# Patient Record
Sex: Male | Born: 1955
Health system: Southern US, Community
[De-identification: ages and names within clinical notes are randomized; demographics above are authoritative.]

## PROBLEM LIST (undated history)

## (undated) DIAGNOSIS — M109 Gout, unspecified: Secondary | ICD-10-CM

## (undated) DIAGNOSIS — L02419 Cutaneous abscess of limb, unspecified: Secondary | ICD-10-CM

## (undated) DIAGNOSIS — R0600 Dyspnea, unspecified: Secondary | ICD-10-CM

## (undated) DIAGNOSIS — M1990 Unspecified osteoarthritis, unspecified site: Secondary | ICD-10-CM

## (undated) DIAGNOSIS — J189 Pneumonia, unspecified organism: Secondary | ICD-10-CM

## (undated) DIAGNOSIS — I509 Heart failure, unspecified: Secondary | ICD-10-CM

## (undated) DIAGNOSIS — B351 Tinea unguium: Secondary | ICD-10-CM

## (undated) DIAGNOSIS — G4733 Obstructive sleep apnea (adult) (pediatric): Secondary | ICD-10-CM

## (undated) DIAGNOSIS — I10 Essential (primary) hypertension: Secondary | ICD-10-CM

## (undated) DIAGNOSIS — M720 Palmar fascial fibromatosis [Dupuytren]: Secondary | ICD-10-CM

## (undated) DIAGNOSIS — L03119 Cellulitis of unspecified part of limb: Secondary | ICD-10-CM

## (undated) DIAGNOSIS — E119 Type 2 diabetes mellitus without complications: Secondary | ICD-10-CM

## (undated) DIAGNOSIS — E790 Hyperuricemia without signs of inflammatory arthritis and tophaceous disease: Secondary | ICD-10-CM

## (undated) DIAGNOSIS — E785 Hyperlipidemia, unspecified: Secondary | ICD-10-CM

## (undated) DIAGNOSIS — I4891 Unspecified atrial fibrillation: Secondary | ICD-10-CM

## (undated) DIAGNOSIS — K644 Residual hemorrhoidal skin tags: Secondary | ICD-10-CM

## (undated) DIAGNOSIS — F1910 Other psychoactive substance abuse, uncomplicated: Secondary | ICD-10-CM

## (undated) DIAGNOSIS — I499 Cardiac arrhythmia, unspecified: Secondary | ICD-10-CM

## (undated) HISTORY — DX: Type 2 diabetes mellitus without complications: E11.9

## (undated) HISTORY — DX: Cellulitis of unspecified part of limb: L03.119

## (undated) HISTORY — DX: Other psychoactive substance abuse, uncomplicated: F19.10

## (undated) HISTORY — DX: Cutaneous abscess of limb, unspecified: L02.419

## (undated) HISTORY — PX: LAPAROSCOPIC GASTRIC SLEEVE RESECTION: SHX5895

## (undated) HISTORY — PX: NASAL SINUS SURGERY: SHX719

## (undated) HISTORY — DX: Essential (primary) hypertension: I10

## (undated) HISTORY — DX: Tinea unguium: B35.1

## (undated) HISTORY — DX: Hyperuricemia without signs of inflammatory arthritis and tophaceous disease: E79.0

## (undated) HISTORY — DX: Hyperlipidemia, unspecified: E78.5

## (undated) HISTORY — DX: Residual hemorrhoidal skin tags: K64.4

## (undated) HISTORY — PX: WISDOM TOOTH EXTRACTION: SHX21

## (undated) HISTORY — DX: Obstructive sleep apnea (adult) (pediatric): G47.33

## (undated) HISTORY — DX: Palmar fascial fibromatosis (dupuytren): M72.0

## (undated) HISTORY — DX: Heart failure, unspecified: I50.9

## (undated) HISTORY — DX: Gout, unspecified: M10.9

## (undated) HISTORY — DX: Unspecified atrial fibrillation: I48.91

---

## 1999-11-20 ENCOUNTER — Ambulatory Visit: Admission: RE | Admit: 1999-11-20 | Discharge: 1999-11-20 | Payer: Self-pay | Admitting: Otolaryngology

## 2001-12-24 DIAGNOSIS — G4733 Obstructive sleep apnea (adult) (pediatric): Secondary | ICD-10-CM | POA: Insufficient documentation

## 2004-12-24 DIAGNOSIS — F1921 Other psychoactive substance dependence, in remission: Secondary | ICD-10-CM | POA: Insufficient documentation

## 2007-06-11 DIAGNOSIS — F17200 Nicotine dependence, unspecified, uncomplicated: Secondary | ICD-10-CM | POA: Insufficient documentation

## 2007-12-25 DIAGNOSIS — L02419 Cutaneous abscess of limb, unspecified: Secondary | ICD-10-CM

## 2007-12-25 HISTORY — DX: Cutaneous abscess of limb, unspecified: L02.419

## 2009-01-25 ENCOUNTER — Ambulatory Visit: Payer: Self-pay | Admitting: Family Medicine

## 2009-02-02 DIAGNOSIS — K644 Residual hemorrhoidal skin tags: Secondary | ICD-10-CM | POA: Insufficient documentation

## 2009-08-23 ENCOUNTER — Inpatient Hospital Stay: Payer: Self-pay | Admitting: Internal Medicine

## 2009-09-13 ENCOUNTER — Ambulatory Visit: Payer: Self-pay | Admitting: Family

## 2009-10-20 ENCOUNTER — Ambulatory Visit: Payer: Self-pay | Admitting: Family

## 2010-05-31 ENCOUNTER — Ambulatory Visit: Payer: Self-pay | Admitting: Orthopedic Surgery

## 2011-09-05 ENCOUNTER — Ambulatory Visit: Payer: Self-pay | Admitting: Family Medicine

## 2013-02-20 LAB — LIPID PANEL
Cholesterol: 242 mg/dL — AB (ref 0–200)
HDL: 38 mg/dL (ref 35–70)
LDL Cholesterol: 157 mg/dL
Triglycerides: 235 mg/dL — AB (ref 40–160)

## 2013-02-20 LAB — TSH: TSH: 2.94 u[IU]/mL (ref <=5.90)

## 2014-05-21 ENCOUNTER — Ambulatory Visit: Payer: Self-pay | Admitting: Family Medicine

## 2014-05-22 ENCOUNTER — Other Ambulatory Visit: Payer: Self-pay | Admitting: Family Medicine

## 2014-05-22 LAB — COMPREHENSIVE METABOLIC PANEL
ALK PHOS: 62 U/L
ANION GAP: 6 — AB (ref 7–16)
AST: 21 U/L (ref 15–37)
Albumin: 3.9 g/dL (ref 3.4–5.0)
BILIRUBIN TOTAL: 1 mg/dL (ref 0.2–1.0)
BUN: 16 mg/dL (ref 7–18)
Calcium, Total: 8.7 mg/dL (ref 8.5–10.1)
Chloride: 100 mmol/L (ref 98–107)
Co2: 32 mmol/L (ref 21–32)
Creatinine: 0.75 mg/dL (ref 0.60–1.30)
EGFR (African American): >60
Glucose: 108 mg/dL — ABNORMAL HIGH (ref 65–99)
OSMOLALITY: 277 (ref 275–301)
Potassium: 3.5 mmol/L (ref 3.5–5.1)
SGPT (ALT): 19 U/L (ref 12–78)
Sodium: 138 mmol/L (ref 136–145)
TOTAL PROTEIN: 7.2 g/dL (ref 6.4–8.2)

## 2014-05-22 LAB — LIPID PANEL
Cholesterol: 199 mg/dL (ref 0–200)
HDL: 38 mg/dL — AB (ref 40–60)
Ldl Cholesterol, Calc: 137 mg/dL — ABNORMAL HIGH (ref 0–100)
Triglycerides: 122 mg/dL (ref 0–200)
VLDL CHOLESTEROL, CALC: 24 mg/dL (ref 5–40)

## 2014-05-22 LAB — PROTIME-INR
INR: 1.3
Prothrombin Time: 16.4 secs — ABNORMAL HIGH (ref 11.5–14.7)

## 2014-06-08 LAB — POCT INR: INR: 2.1 — AB (ref <=1.1)

## 2014-10-06 DIAGNOSIS — K219 Gastro-esophageal reflux disease without esophagitis: Secondary | ICD-10-CM | POA: Insufficient documentation

## 2014-10-06 DIAGNOSIS — R600 Localized edema: Secondary | ICD-10-CM | POA: Insufficient documentation

## 2014-10-06 DIAGNOSIS — I4891 Unspecified atrial fibrillation: Secondary | ICD-10-CM | POA: Insufficient documentation

## 2014-10-06 DIAGNOSIS — J45909 Unspecified asthma, uncomplicated: Secondary | ICD-10-CM | POA: Insufficient documentation

## 2014-10-06 DIAGNOSIS — E785 Hyperlipidemia, unspecified: Secondary | ICD-10-CM | POA: Insufficient documentation

## 2014-10-06 DIAGNOSIS — E119 Type 2 diabetes mellitus without complications: Secondary | ICD-10-CM | POA: Insufficient documentation

## 2014-10-25 ENCOUNTER — Encounter: Payer: Self-pay | Admitting: Cardiovascular Disease

## 2014-10-25 ENCOUNTER — Ambulatory Visit (INDEPENDENT_AMBULATORY_CARE_PROVIDER_SITE_OTHER): Payer: BC Managed Care – PPO | Admitting: Cardiovascular Disease

## 2014-10-25 ENCOUNTER — Encounter (INDEPENDENT_AMBULATORY_CARE_PROVIDER_SITE_OTHER): Payer: Self-pay

## 2014-10-25 VITALS — BP 178/110 | HR 71 | Ht 72.0 in | Wt >= 6400 oz

## 2014-10-25 DIAGNOSIS — R0602 Shortness of breath: Secondary | ICD-10-CM

## 2014-10-25 DIAGNOSIS — R600 Localized edema: Secondary | ICD-10-CM

## 2014-10-25 DIAGNOSIS — I5032 Chronic diastolic (congestive) heart failure: Secondary | ICD-10-CM | POA: Insufficient documentation

## 2014-10-25 DIAGNOSIS — E119 Type 2 diabetes mellitus without complications: Secondary | ICD-10-CM

## 2014-10-25 DIAGNOSIS — I4891 Unspecified atrial fibrillation: Secondary | ICD-10-CM

## 2014-10-25 MED ORDER — DOXAZOSIN MESYLATE 8 MG PO TABS: 8.0000 mg | ORAL_TABLET | Freq: Every day | ORAL | Status: DC

## 2014-10-25 NOTE — Assessment & Plan Note (Signed)
Long discussion today concerning his shortness of breath. This is likely from diastolic CHF, obesity, deconditioning. Given his long history of smoking, unable to exclude ischemia. We'll try to obtain records from prior cardiologist to determine if any testing was done. If symptoms get worse, may need a stress test, lexiscan, though he would likely be too big for the camera

## 2014-10-25 NOTE — Patient Instructions (Addendum)
You are doing well.  Please start 1/2 pill doxazosin at night for one week Then increase up to a full pill if you have no dizziness  We will check labs today  Please call us if you have new issues that need to be addressed before your next appt.  Your physician wants you to follow-up in: 6 months.  You will receive a reminder letter in the mail two months in advance. If you don't receive a letter, please call our office to schedule the follow-up appointment.  Your next appointment will be scheduled in our new office located at :  [facility]  [address], [address]  [place], [place] [id number]

## 2014-10-25 NOTE — Assessment & Plan Note (Signed)
We will check a basic metabolic panel today. He reports dramatic improvement in his symptoms on metolazone with Lasix 80 mg daily. If renal function gets worse indicating prerenal state, we would likely recommend decreasing the dose of his metolazone. Further recognition based off his BMP. If he continues to have issues, torsemide would always be an option, 40 mg daily

## 2014-10-25 NOTE — Assessment & Plan Note (Signed)
Recommended portion control, watching his diet. He reports hemoglobin A1c is well controlled

## 2014-10-25 NOTE — Assessment & Plan Note (Signed)
Recommended weight loss. This would help his sleep, heart failure, shortness of breath

## 2014-10-25 NOTE — Assessment & Plan Note (Signed)
Lower extremity edema has significantly improved on metolazone. BMP pending

## 2014-10-25 NOTE — Progress Notes (Addendum)
Patient ID: Marco James, male    DOB: [birth year], 57 y.o.   MRN: [id number]  HPI Comments: Marco James is a 58 year old gentleman with morbid obesity, history of alcohol use, hypertension, diabetes type 2, chronic lower extremity swelling, chronic atrial fibrillation who [facility].  He reports that he developed atrial fibrillation many years ago. [facility] and per the patient was never given the option to restore normal sinus rhythm. He has been compliant with anticoagulation, denies having any problems.  Recently his lower extremity edema has been a significant issue. He drinks significant amounts of fluid including several beers at a time. Drives his motorcycle periodically, making his leg swelling worse. Does not wear compression hose.  Diuretic regimen was recently changed, now taking Lasix 80 mg in the morning with metolazone 2.5 mg daily. On this regiment he has had significant improvement in his leg swelling and breathing over the past 2 weeks. No recent lab work since June 2015. He does not take potassium on a regular basis He does report that his legs have improved dramatically, previously had sores that have now improved, even resolved. Reports having a dry mouth on a regular basis, drink significant fluids  EKG shows atrial fibrillation with ventricular rate 71 bpm, nonspecific ST abnormality Is uncertain if he has had prior workup with previous cardiologist including echocardiogram. He denies having a stress test He does have shortness of breath with exertion. He is uncertain if this is from deconditioning or other problem. He reports this is generally a chronic issue   Outpatient Encounter Prescriptions as of 10/25/2014  Medication Sig  . amLODipine (NORVASC) 10 MG tablet Take 10 mg by mouth daily.  Marland Kitchen. doxazosin (CARDURA) 8 MG tablet Take 1 tablet (8 mg total) by mouth daily.  . furosemide (LASIX) 80 MG  tablet Take 80 mg by mouth daily.  Marland Kitchen. glipiZIDE (GLUCOTROL XL) 5 MG 24 hr tablet Take 5 mg by mouth daily with breakfast.  . lisinopril (PRINIVIL,ZESTRIL) 40 MG tablet Take 40 mg by mouth daily.  . metolazone (ZAROXOLYN) 2.5 MG tablet Take 2.5 mg by mouth daily as needed.   . metoprolol (LOPRESSOR) 100 MG tablet Take 100 mg by mouth 2 (two) times daily.  . naproxen (NAPROSYN) 500 MG tablet Take 500 mg by mouth 2 (two) times daily with a meal.  . NON FORMULARY CPAP  . warfarin (COUMADIN) 6 MG tablet Take 6 mg by mouth daily.   In terms of his family history, indicated that his mother is alive. He indicated that his father is alive.    In terms of his social history,  reports that he has quit smoking. His smoking use included Cigarettes. He has a 25 pack-year smoking history. He does not have any smokeless tobacco history on file. He reports that he drinks about 4.2 oz of alcohol per week. He reports that he uses illicit drugs (Cocaine and Marijuana).  Past Medical History  Diagnosis Date  . HTN (hypertension)   . Reactive airway disease   . Hyperlipidemia   . Type 2 diabetes mellitus   . GERD (gastroesophageal reflux disease)   . Toenail fungus   . Polysubstance abuse   . A-fib   . Arrhythmia   . Hyperuricemia   . Chronic airway obstruction   . Obstructive sleep apnea   . CHF (congestive heart failure)   . Cellulitis and abscess of leg 2009  . External hemorrhoids   .  Dupuytren's disease     Review of Systems  Constitutional: Negative.   HENT: Negative.   Eyes: Negative.   Respiratory: Negative.   Cardiovascular: Negative.   Gastrointestinal: Negative.   Endocrine: Negative.   Musculoskeletal: Negative.   Skin: Negative.   Allergic/Immunologic: Negative.   Neurological: Negative.   Hematological: Negative.   Psychiatric/Behavioral: Negative.   All other systems reviewed and are negative.  BP 178/110 mmHg  Pulse 71  Ht 6' (1.829 m)  Wt 404 lb (183.253 kg)  BMI  54.78 kg/m2 Repeat blood pressure with systolic 180 He reports that he did not take his lisinopril today Physical Exam  Constitutional: He is oriented to person, place, and time. He appears well-developed and well-nourished.  HENT:  Head: Normocephalic.  Nose: Nose normal.  Mouth/Throat: Oropharynx is clear and moist.  Eyes: Conjunctivae are normal. Pupils are equal, round, and reactive to light.  Neck: Normal range of motion. Neck supple. No JVD present.  Cardiovascular: Normal rate, regular rhythm, S1 normal, S2 normal, normal heart sounds and intact distal pulses.  Exam reveals no gallop and no friction rub.   No murmur heard. Pulmonary/Chest: Effort normal and breath sounds normal. No respiratory distress. He has no wheezes. He has no rales. He exhibits no tenderness.  Abdominal: Soft. Bowel sounds are normal. He exhibits no distension. There is no tenderness.  Musculoskeletal: Normal range of motion. He exhibits no edema or tenderness.  Lymphadenopathy:    He has no cervical adenopathy.  Neurological: He is alert and oriented to person, place, and time. Coordination normal.  Skin: Skin is warm and dry. No rash noted. No erythema.  Psychiatric: He has a normal mood and affect. His behavior is normal. Judgment and thought content normal.      Assessment and Plan   Nursing note and vitals reviewed.

## 2014-10-25 NOTE — Assessment & Plan Note (Signed)
Heart rate is well controlled, tolerating anticoagulation. Not a candidate for cardioversion given that he is been in atrial fibrillation for many years and this would likely not be successful. Luckily he is relatively asymptomatic apart from his diastolic CHF

## 2014-10-26 ENCOUNTER — Other Ambulatory Visit: Payer: Self-pay | Admitting: *Deleted

## 2014-10-26 LAB — BASIC METABOLIC PANEL
BUN/Creatinine Ratio: 16 (ref 9–20)
BUN: 21 mg/dL (ref 6–24)
CO2: 32 mmol/L — ABNORMAL HIGH (ref 18–29)
Calcium: 9.2 mg/dL (ref 8.7–10.2)
Chloride: 88 mmol/L — ABNORMAL LOW (ref 97–108)
Creatinine, Ser: 1.3 mg/dL — ABNORMAL HIGH (ref 0.76–1.27)
GFR calc Af Amer: 70 mL/min/{1.73_m2} (ref >59)
GFR calc non Af Amer: 61 mL/min/{1.73_m2} (ref >59)
Glucose: 159 mg/dL — ABNORMAL HIGH (ref 65–99)
Potassium: 3.2 mmol/L — ABNORMAL LOW (ref 3.5–5.2)
Sodium: 137 mmol/L (ref 134–144)

## 2014-10-26 MED ORDER — DOXAZOSIN MESYLATE 8 MG PO TABS: 8.0000 mg | ORAL_TABLET | Freq: Every day | ORAL | Status: DC

## 2014-10-26 NOTE — Telephone Encounter (Signed)
Requested Prescriptions   Signed Prescriptions Disp Refills  . doxazosin (CARDURA) 8 MG tablet 90 tablet 3    Sig: Take 1 tablet (8 mg total) by mouth daily.    Authorizing Provider: Antonieta IbaGOLLAN, TIMOTHY J    Ordering User: Kendrick FriesLOPEZ, Cortlan Dolin C

## 2014-10-27 ENCOUNTER — Other Ambulatory Visit: Payer: Self-pay

## 2014-10-27 MED ORDER — POTASSIUM CHLORIDE CRYS ER 20 MEQ PO TBCR: 20.0000 meq | EXTENDED_RELEASE_TABLET | Freq: Every day | ORAL | Status: DC

## 2015-02-15 LAB — CBC AND DIFFERENTIAL
HCT: 43 % (ref 41–53)
HEMOGLOBIN: 15 g/dL (ref 13.5–17.5)
Neutrophils Absolute: 79 /uL
PLATELETS: 150 10*3/uL (ref 150–399)
WBC: 8 10^3/mL

## 2015-02-15 LAB — BASIC METABOLIC PANEL
BUN: 22 mg/dL — AB (ref 4–21)
Creatinine: 1.4 mg/dL — AB (ref <=1.3)
Glucose: 194 mg/dL
Potassium: 3.5 mmol/L (ref 3.4–5.3)
SODIUM: 139 mmol/L (ref 137–147)

## 2015-02-15 LAB — HEMOGLOBIN A1C: Hgb A1c MFr Bld: 6.2 % — AB (ref 4.0–6.0)

## 2015-02-15 LAB — HEPATIC FUNCTION PANEL
ALT: 19 U/L (ref 10–40)
AST: 18 U/L (ref 14–40)
Alkaline Phosphatase: 49 U/L (ref 25–125)
Bilirubin, Total: 0.8 mg/dL

## 2015-06-13 ENCOUNTER — Encounter: Payer: Self-pay | Admitting: Family Medicine

## 2015-06-13 ENCOUNTER — Ambulatory Visit (INDEPENDENT_AMBULATORY_CARE_PROVIDER_SITE_OTHER): Payer: BLUE CROSS/BLUE SHIELD | Admitting: Family Medicine

## 2015-06-13 VITALS — BP 130/76 | HR 66 | Temp 98.5°F | Resp 18 | Wt 389.0 lb

## 2015-06-13 DIAGNOSIS — I5032 Chronic diastolic (congestive) heart failure: Secondary | ICD-10-CM

## 2015-06-13 DIAGNOSIS — Z8639 Personal history of other endocrine, nutritional and metabolic disease: Secondary | ICD-10-CM | POA: Diagnosis not present

## 2015-06-13 DIAGNOSIS — E1140 Type 2 diabetes mellitus with diabetic neuropathy, unspecified: Secondary | ICD-10-CM | POA: Diagnosis not present

## 2015-06-13 DIAGNOSIS — Z87898 Personal history of other specified conditions: Secondary | ICD-10-CM | POA: Insufficient documentation

## 2015-06-13 DIAGNOSIS — E668 Other obesity: Secondary | ICD-10-CM | POA: Insufficient documentation

## 2015-06-13 DIAGNOSIS — R600 Localized edema: Secondary | ICD-10-CM | POA: Insufficient documentation

## 2015-06-13 DIAGNOSIS — I2510 Atherosclerotic heart disease of native coronary artery without angina pectoris: Secondary | ICD-10-CM | POA: Insufficient documentation

## 2015-06-13 DIAGNOSIS — E7800 Pure hypercholesterolemia, unspecified: Secondary | ICD-10-CM | POA: Insufficient documentation

## 2015-06-13 DIAGNOSIS — I4891 Unspecified atrial fibrillation: Secondary | ICD-10-CM | POA: Diagnosis not present

## 2015-06-13 DIAGNOSIS — I10 Essential (primary) hypertension: Secondary | ICD-10-CM | POA: Diagnosis not present

## 2015-06-13 DIAGNOSIS — F1910 Other psychoactive substance abuse, uncomplicated: Secondary | ICD-10-CM | POA: Insufficient documentation

## 2015-06-13 DIAGNOSIS — E790 Hyperuricemia without signs of inflammatory arthritis and tophaceous disease: Secondary | ICD-10-CM | POA: Insufficient documentation

## 2015-06-13 DIAGNOSIS — R609 Edema, unspecified: Secondary | ICD-10-CM | POA: Insufficient documentation

## 2015-06-13 DIAGNOSIS — E669 Obesity, unspecified: Secondary | ICD-10-CM | POA: Insufficient documentation

## 2015-06-13 LAB — POCT GLYCOSYLATED HEMOGLOBIN (HGB A1C): HEMOGLOBIN A1C: 5.3

## 2015-06-13 MED ORDER — POTASSIUM CHLORIDE CRYS ER 20 MEQ PO TBCR: 20.0000 meq | EXTENDED_RELEASE_TABLET | Freq: Two times a day (BID) | ORAL | Status: DC

## 2015-06-13 NOTE — Progress Notes (Signed)
Subjective:    Patient ID: Marco James [facility], male    DOB: [birth year], 59 y.o.   MRN: [id number]  HPI Follow-up DM. Denied polyuria, polydipsia or polyphagia. Peripheral tingling or pins and needles sensation less but still present at times. Has not lost significant amount of weight. Still taking the Glipizide with no significant changes in BS.  Chronic atrial fibrillation stable with use of Metoprolol daily. No chest pains, diaphoresis or palpitations.  Chronic diastolic CHF better controlled. No worsening of dyspnea on exertion (especially with his degree of obesity). Continue to use the coumadin 6 mg qd, Zaroxolyn and Lasix as needed for edema of lower legs. No increase in lower leg edema. Still getting KCL everytime he uses these medication.  Essential hypertension well controlled on Amlodipine, Doxazosin, Lisinopril and diuretics for control of BP. No significant muscle cramps and still trying to drink extra fluids during the hot weather.   CMP     Component Value Date/Time   NA 139 02/15/2015   NA 138 05/22/2014 1337   K 3.5 02/15/2015   K 3.5 05/22/2014 1337   CL 88* 10/25/2014 1528   CL 100 05/22/2014 1337   CO2 32* 10/25/2014 1528   CO2 32 05/22/2014 1337   GLUCOSE 159* 10/25/2014 1528   GLUCOSE 108* 05/22/2014 1337   BUN 22* 02/15/2015   BUN 16 05/22/2014 1337   CREATININE 1.4* 02/15/2015   CREATININE 1.30* 10/25/2014 1528   CREATININE 0.75 05/22/2014 1337   CALCIUM 9.2 10/25/2014 1528   CALCIUM 8.7 05/22/2014 1337   PROT 7.2 05/22/2014 1337   ALBUMIN 3.9 05/22/2014 1337   AST 18 02/15/2015   AST 21 05/22/2014 1337   ALT 19 02/15/2015   ALT 19 05/22/2014 1337   ALKPHOS 49 02/15/2015   ALKPHOS 62 05/22/2014 1337   GFRNONAA 61 10/25/2014 1528   GFRNONAA >60 05/22/2014 1337   GFRAA 70 10/25/2014 1528   GFRAA >60 05/22/2014 1337     Past Medical History  Diagnosis Date  . HTN (hypertension)   . Reactive airway disease   . Hyperlipidemia   . Type 2 diabetes  mellitus   . GERD (gastroesophageal reflux disease)   . Toenail fungus   . Polysubstance abuse   . A-fib   . Arrhythmia   . Hyperuricemia   . Chronic airway obstruction   . Obstructive sleep apnea   . CHF (congestive heart failure)   . Cellulitis and abscess of leg 2009  . External hemorrhoids   . Dupuytren's disease    History reviewed. No pertinent past surgical history. History  Substance Use Topics  . Smoking status: Former Smoker -- 1.00 packs/day for 25 years    Types: Cigarettes  . Smokeless tobacco: Not on file  . Alcohol Use: 4.2 oz/week    5 Cans of beer, 2 Shots of liquor per week     Comment: OCCASIONALLY   Family History  Problem Relation Age of Onset  . Heart disease Father 28   Current Outpatient Prescriptions on File Prior to Visit  Medication Sig Dispense Refill  . amLODipine (NORVASC) 10 MG tablet Take 10 mg by mouth daily.    Marland Kitchen doxazosin (CARDURA) 8 MG tablet Take 1 tablet (8 mg total) by mouth daily. 90 tablet 3  . furosemide (LASIX) 80 MG tablet Take 80 mg by mouth daily.    Marland Kitchen glipiZIDE (GLUCOTROL XL) 5 MG 24 hr tablet Take 5 mg by mouth daily with breakfast.    . lisinopril (PRINIVIL,ZESTRIL)  40 MG tablet Take 40 mg by mouth daily.    . metolazone (ZAROXOLYN) 2.5 MG tablet Take 2.5 mg by mouth daily as needed.     . metoprolol (LOPRESSOR) 100 MG tablet Take 100 mg by mouth 2 (two) times daily.    . naproxen (NAPROSYN) 500 MG tablet Take 500 mg by mouth 2 (two) times daily with a meal.    . NON FORMULARY CPAP    . omeprazole (PRILOSEC) 20 MG capsule Take 20 mg by mouth daily.    . potassium chloride SA (K-DUR,KLOR-CON) 20 MEQ tablet Take 1 tablet (20 mEq total) by mouth daily. 90 tablet 3  . warfarin (COUMADIN) 6 MG tablet Take 6 mg by mouth daily.     No current facility-administered medications on file prior to visit.   No Known Allergies   weight is 389 lb (176.449 kg). His oral temperature is 98.5 F (36.9 C). His blood pressure is 130/76 and  his pulse is 66. His respiration is 18.    Review of Systems  Constitutional: Negative.   HENT: Negative.   Eyes: Negative.   Respiratory: Negative.   Cardiovascular: Negative.   Gastrointestinal: Negative.   Genitourinary: Negative.   Neurological:       Occasional tingling and numbness in feet         Objective:   Physical Exam  Constitutional: He is oriented to person, place, and time. He appears well-developed and well-nourished. No distress.  Marked morbid obesity  HENT:  Head: Normocephalic and atraumatic.  Right Ear: Hearing normal.  Left Ear: Hearing normal.  Nose: Nose normal.  Eyes: Conjunctivae and lids are normal. Right eye exhibits no discharge. Left eye exhibits no discharge. No scleral icterus.  Neck: Neck supple.  Pulmonary/Chest: Effort normal. No respiratory distress.  Abdominal: Soft. Bowel sounds are normal.  Marked morbid obesity  Musculoskeletal: Normal range of motion.  Neurological: He is alert and oriented to person, place, and time.  Skin: Skin is intact. No lesion noted.  Tan pigmentation to lower anterior shins. Scabs from past bumps and skin tears.  Psychiatric: He has a normal mood and affect. His speech is normal and behavior is normal. Thought content normal.      Assessment & Plan:   1. Type 2 diabetes mellitus with diabetic neuropathy Feeling fairly well. Hgb A1C 5.3 today which coincides with average BS of 103. Continues to try to follow diabetic diet and take Glipizide 5 mg qd. No significant change in vision. Recommend he get ophthalmology exam annually. Will recheck routine labs and continue present regimen. Encouraged to continue to work on weight loss. - POCT HgB A1C - Comprehensive metabolic panel - CBC with Differential/Platelet  2. Atrial fibrillation, unspecified Stable without chest pains or palpitations. Followed by Dr. Mariah Milling  (occasionally). Still on Coumadin 6 mg qd and Metoprolol. Does not return often for protime and  can't afford other therapies that don't require regular protime because he is self-insured. Recheck labs and follow up pending reports.  3. Diastolic CHF, chronic Less pitting edema in lower legs. Feels he controls it better when he uses the Furosemide with occasional Zaroxolyn.  - Comprehensive metabolic panel  4. Essential (primary) hypertension Best BP reading in months. Continues to tolerate Doxazosin, Lisinopril and Amlodipine. Some puffiness of lower legs and some scabs from scrapes while riding his motorcycle. Recheck labs. - Comprehensive metabolic panel - CBC with Differential/Platelet  5. H/O hypokalemia Last potassium level normal (as listed above) with regular KCL supplement (  especially with use of Furosemide and occasional Zaroxolyn. Recheck electrolytes and renal function. Follow up appointment pending reports. - Comprehensive metabolic panel

## 2015-06-17 ENCOUNTER — Other Ambulatory Visit: Payer: Self-pay | Admitting: Family Medicine

## 2015-06-17 ENCOUNTER — Telehealth: Payer: Self-pay

## 2015-06-17 MED ORDER — NAPROXEN 500 MG PO TABS: 500.0000 mg | ORAL_TABLET | Freq: Two times a day (BID) | ORAL | Status: DC

## 2015-06-17 NOTE — Telephone Encounter (Signed)
Refill request received from [facility] for Naproxen 500 mg tablets #180.

## 2015-06-20 NOTE — Telephone Encounter (Signed)
Marco James sent Naproxen refill to pharmacy.

## 2015-07-04 ENCOUNTER — Other Ambulatory Visit: Payer: Self-pay

## 2015-07-04 DIAGNOSIS — I10 Essential (primary) hypertension: Secondary | ICD-10-CM

## 2015-07-04 DIAGNOSIS — I5030 Unspecified diastolic (congestive) heart failure: Secondary | ICD-10-CM

## 2015-07-04 MED ORDER — FUROSEMIDE 80 MG PO TABS
80.0000 mg | ORAL_TABLET | Freq: Every day | ORAL | Status: DC
Start: 2015-07-04 — End: 2015-09-02

## 2015-07-04 MED ORDER — METOPROLOL TARTRATE 100 MG PO TABS
100.0000 mg | ORAL_TABLET | Freq: Two times a day (BID) | ORAL | Status: DC
Start: 2015-07-04 — End: 2016-04-05

## 2015-07-04 NOTE — Telephone Encounter (Signed)
Refill request received from [facility] for Furosemide 80 mg and Metoprolol 100 mg.

## 2015-07-04 NOTE — Telephone Encounter (Signed)
Patient advised refills were sent to pharmacy.

## 2015-07-04 NOTE — Telephone Encounter (Signed)
Advise patient refills sent to [facility] for the Furosemide and Metoprolol.

## 2015-07-06 ENCOUNTER — Telehealth: Payer: Self-pay

## 2015-07-06 NOTE — Telephone Encounter (Signed)
[facility] refills for Amlodipine 10 mg and Warfarin 6 mg.

## 2015-07-07 MED ORDER — AMLODIPINE BESYLATE 10 MG PO TABS: 10.0000 mg | ORAL_TABLET | Freq: Every day | ORAL | Status: DC

## 2015-07-07 MED ORDER — WARFARIN SODIUM 6 MG PO TABS: 6.0000 mg | ORAL_TABLET | Freq: Every day | ORAL | Status: DC

## 2015-07-07 NOTE — Telephone Encounter (Signed)
RX sent to [facility].

## 2015-08-23 ENCOUNTER — Telehealth: Payer: Self-pay | Admitting: Family Medicine

## 2015-08-24 ENCOUNTER — Encounter: Payer: Self-pay | Admitting: Family Medicine

## 2015-08-24 ENCOUNTER — Ambulatory Visit (INDEPENDENT_AMBULATORY_CARE_PROVIDER_SITE_OTHER): Payer: BLUE CROSS/BLUE SHIELD | Admitting: Family Medicine

## 2015-08-24 VITALS — BP 128/70 | HR 68 | Temp 98.0°F | Resp 16

## 2015-08-24 DIAGNOSIS — K648 Other hemorrhoids: Secondary | ICD-10-CM | POA: Diagnosis not present

## 2015-08-24 DIAGNOSIS — K625 Hemorrhage of anus and rectum: Secondary | ICD-10-CM

## 2015-08-24 DIAGNOSIS — I4891 Unspecified atrial fibrillation: Secondary | ICD-10-CM | POA: Diagnosis not present

## 2015-08-24 LAB — POCT INR: INR: 2.1

## 2015-08-24 MED ORDER — HYDROCORTISONE ACETATE 25 MG RE SUPP: 25.0000 mg | Freq: Every day | RECTAL | Status: DC

## 2015-08-24 NOTE — Progress Notes (Signed)
Patient ID: Marco James [facility], male   DOB: 1956/09/02, 59 y.o.   MRN: [id number]    Subjective:  HPI Pt reports that he had rectal bleeding yesterday. It was bright red blood. He reports that he noticed it when he had a BM and then went back to the bathroom and " blood just poured out of me". He does take warfarin 6 mg daily. INR today was 2.1. Last INR was done 02/15/15 and it was 1.8. PT denies any abdominal pain, nausea or vomiting also denies any constipation or diarrhea. Pt replied with "not really" when asked if he had ever had hemorrhoids before. He has never had a colonoscopy. He has had no bleeding today. Prior to Admission medications   Medication Sig Start Date End Date Taking? Authorizing Provider  amLODipine (NORVASC) 10 MG tablet Take 1 tablet (10 mg total) by mouth daily. 07/07/15  Yes Dennis E Chrismon, PA  doxazosin (CARDURA) 8 MG tablet Take 1 tablet (8 mg total) by mouth daily. 10/26/14  Yes Antonieta Iba, MD  furosemide (LASIX) 80 MG tablet Take 1 tablet (80 mg total) by mouth daily. 07/04/15  Yes Dennis E Chrismon, PA  lisinopril (PRINIVIL,ZESTRIL) 40 MG tablet Take 40 mg by mouth daily.   Yes Historical Provider, MD  metolazone (ZAROXOLYN) 2.5 MG tablet Take 2.5 mg by mouth daily as needed.    Yes Historical Provider, MD  metoprolol (LOPRESSOR) 100 MG tablet Take 1 tablet (100 mg total) by mouth 2 (two) times daily. 07/04/15  Yes Dennis E Chrismon, PA  naproxen (NAPROSYN) 500 MG tablet  08/19/15  Yes Historical Provider, MD  NON FORMULARY CPAP   Yes Historical Provider, MD  potassium chloride SA (K-DUR,KLOR-CON) 20 MEQ tablet Take 1 tablet (20 mEq total) by mouth 2 (two) times daily. 06/13/15  Yes Dennis E Chrismon, PA  warfarin (COUMADIN) 6 MG tablet Take 1 tablet (6 mg total) by mouth daily. 07/07/15  Yes Dennis E Chrismon, PA  glipiZIDE (GLUCOTROL XL) 5 MG 24 hr tablet Take 5 mg by mouth daily with breakfast.    Historical Provider, MD  montelukast (SINGULAIR) 10 MG tablet Take  10 mg by mouth at bedtime.    Historical Provider, MD    Patient Active Problem List   Diagnosis Date Noted  . Polysubstance abuse 06/13/2015  . Edema, peripheral 06/13/2015  . Extreme obesity 06/13/2015  . Accelerated hypertension 06/13/2015  . Elevated blood uric acid level 06/13/2015  . Essential (primary) hypertension 06/13/2015  . Hypercholesteremia 06/13/2015  . History of prolonged Q-T interval on ECG 06/13/2015  . Fatigue 06/13/2015  . Benign paroxysmal positional nystagmus 06/13/2015  . Clinical depression 06/13/2015  . Arteriosclerosis of coronary artery 06/13/2015  . H/O hypokalemia 06/13/2015  . Atrial fibrillation, unspecified 10/25/2014  . Diastolic CHF, chronic 10/25/2014  . Morbid obesity 10/25/2014  . Bilateral lower extremity edema 10/25/2014  . Shortness of breath 10/25/2014  . Well controlled type 2 diabetes mellitus 10/25/2014  . Apnea, sleep 10/06/2014  . A-fib 10/06/2014  . Bronchitis 10/06/2014  . CCF (congestive cardiac failure) 10/06/2014  . Diabetes mellitus, type 2 10/06/2014  . Dupuytren's disease 10/06/2014  . Edema leg 10/06/2014  . Acid reflux 10/06/2014  . BP (high blood pressure) 10/06/2014  . HLD (hyperlipidemia) 10/06/2014  . RAD (reactive airway disease) 10/06/2014  . Arthropathia 04/30/2010  . External hemorrhoids without complication 02/02/2009  . Dermatitis, purulent 12/16/2008  . Compulsive tobacco user syndrome 06/11/2007  . Diabetes 12/24/2004  . Drug dependence,  in remission 12/24/2004  . Obstructive apnea 12/24/2001  . CAFL (chronic airflow limitation) 08/11/1989    Past Medical History  Diagnosis Date  . HTN (hypertension)   . Reactive airway disease   . Hyperlipidemia   . Type 2 diabetes mellitus   . GERD (gastroesophageal reflux disease)   . Toenail fungus   . Polysubstance abuse   . A-fib   . Arrhythmia   . Hyperuricemia   . Chronic airway obstruction   . Obstructive sleep apnea   . CHF (congestive heart  failure)   . Cellulitis and abscess of leg 2009  . External hemorrhoids   . Dupuytren's disease     Social History   Social History  . Marital Status: Divorced    Spouse Name: N/A  . Number of Children: N/A  . Years of Education: N/A   Occupational History  . Not on file.   Social History Main Topics  . Smoking status: Former Smoker -- 1.00 packs/day for 25 years    Types: Cigarettes  . Smokeless tobacco: Never Used  . Alcohol Use: 4.2 oz/week    5 Cans of beer, 2 Shots of liquor per week     Comment: OCCASIONALLY  . Drug Use: Yes    Special: Cocaine, Marijuana     Comment: Pt. used cocaine for 3 years, marijuana  . Sexual Activity: Not on file   Other Topics Concern  . Not on file   Social History Narrative    No Known Allergies  Review of Systems  Constitutional: Negative.   HENT: Negative.   Eyes: Negative.   Respiratory: Negative.   Cardiovascular: Negative.   Gastrointestinal: Positive for blood in stool.  Genitourinary: Negative.   Musculoskeletal: Negative.   Skin: Negative.   Neurological: Negative.   Endo/Heme/Allergies: Negative.   Psychiatric/Behavioral: Negative.      There is no immunization history on file for this patient. Objective:  BP 128/70 mmHg  Pulse 68  Temp(Src) 98 F (36.7 C) (Oral)  Resp 16  Physical Exam  Constitutional: He is oriented to person, place, and time and well-developed, well-nourished, and in no distress.  Morbidly obese man in no acute distress  HENT:  Head: Normocephalic and atraumatic.  Right Ear: External ear normal.  Left Ear: External ear normal.  Nose: Nose normal.  Eyes: Conjunctivae are normal.  Neck: Neck supple.  Cardiovascular: Normal rate, regular rhythm and normal heart sounds.   Pulmonary/Chest: Effort normal and breath sounds normal.  Abdominal: Soft.  Genitourinary: Rectum normal.  Perianal exam visualized shows what appears to be impaired irritated perianal hemorrhoidal tag. No obvious  internal hemorrhoids, no fissures. DRE negative for any mass effect.  Neurological: He is alert and oriented to person, place, and time.  Skin: Skin is warm and dry.  Psychiatric: Mood, memory and affect normal.    Lab Results  Component Value Date   WBC 8.0 02/15/2015   HGB 15.0 02/15/2015   HCT 43 02/15/2015   PLT 150 02/15/2015   GLUCOSE 159* 10/25/2014   CHOL 199 05/22/2014   TRIG 122 05/22/2014   HDL 38* 05/22/2014   LDLCALC 137* 05/22/2014   TSH 2.94 02/20/2013   INR 2.1* 06/08/2014   HGBA1C 5.3 06/13/2015    CMP     Component Value Date/Time   NA 139 02/15/2015   NA 138 05/22/2014 1337   K 3.5 02/15/2015   K 3.5 05/22/2014 1337   CL 88* 10/25/2014 1528   CL 100 05/22/2014 1337  CO2 32* 10/25/2014 1528   CO2 32 05/22/2014 1337   GLUCOSE 159* 10/25/2014 1528   GLUCOSE 108* 05/22/2014 1337   BUN 22* 02/15/2015   BUN 16 05/22/2014 1337   CREATININE 1.4* 02/15/2015   CREATININE 1.30* 10/25/2014 1528   CREATININE 0.75 05/22/2014 1337   CALCIUM 9.2 10/25/2014 1528   CALCIUM 8.7 05/22/2014 1337   PROT 7.2 05/22/2014 1337   ALBUMIN 3.9 05/22/2014 1337   AST 18 02/15/2015   AST 21 05/22/2014 1337   ALT 19 02/15/2015   ALT 19 05/22/2014 1337   ALKPHOS 49 02/15/2015   ALKPHOS 62 05/22/2014 1337   BILITOT 1.0 05/22/2014 1337   GFRNONAA 61 10/25/2014 1528   GFRNONAA >60 05/22/2014 1337   GFRAA 70 10/25/2014 1528   GFRAA >60 05/22/2014 1337    Assessment and Plan :  1. Atrial fibrillation, unspecified Patient noncompliant with follow-up on Coumadin - CBC with Differential/Platelet  2. Rectal bleeding Instruct the patient and the need to get CBC and refer to GI for colonoscopy. He obviously does not want to do this. - POCT INR - hydrocortisone (ANUSOL-HC) 25 MG suppository; Place 1 suppository (25 mg total) rectally at bedtime.  Dispense: 12 suppository; Refill: 5 - Ambulatory referral to Gastroenterology - CBC with Differential/Platelet  3. Internal  hemorrhoid We'll check treat presumptively for internal hemorrhoid as this is the thing that will respond best to any treatment. - hydrocortisone (ANUSOL-HC) 25 MG suppository; Place 1 suppository (25 mg total) rectally at bedtime.  Dispense: 12 suppository; Refill: 5 - Ambulatory referral to Gastroenterology 4. Morbid obesity 5. Noncompliance with follow-up I have done the exam and reviewed the above chart and it is accurate to the best of my knowledge.   Julieanne Manson MD [facility] 08/24/2015 3:46 PM

## 2015-08-24 NOTE — Telephone Encounter (Signed)
Telephone encounter opened in error. KW

## 2015-08-25 ENCOUNTER — Telehealth: Payer: Self-pay

## 2015-08-25 LAB — COMPREHENSIVE METABOLIC PANEL
ALT: 16 IU/L (ref 0–44)
AST: 15 IU/L (ref 0–40)
Albumin/Globulin Ratio: 2.2 (ref 1.1–2.5)
Albumin: 4.2 g/dL (ref 3.5–5.5)
Alkaline Phosphatase: 51 IU/L (ref 39–117)
BUN/Creatinine Ratio: 23 — ABNORMAL HIGH (ref 9–20)
BUN: 35 mg/dL — ABNORMAL HIGH (ref 6–24)
Bilirubin Total: 0.5 mg/dL (ref 0.0–1.2)
CALCIUM: 8.8 mg/dL (ref 8.7–10.2)
CO2: 30 mmol/L — AB (ref 18–29)
Chloride: 89 mmol/L — ABNORMAL LOW (ref 97–108)
Creatinine, Ser: 1.49 mg/dL — ABNORMAL HIGH (ref 0.76–1.27)
GFR calc Af Amer: 59 mL/min/{1.73_m2} — ABNORMAL LOW (ref >59)
GFR, EST NON AFRICAN AMERICAN: 51 mL/min/{1.73_m2} — AB (ref >59)
GLOBULIN, TOTAL: 1.9 g/dL (ref 1.5–4.5)
Glucose: 164 mg/dL — ABNORMAL HIGH (ref 65–99)
Potassium: 2.6 mmol/L — ABNORMAL LOW (ref 3.5–5.2)
SODIUM: 140 mmol/L (ref 134–144)
Total Protein: 6.1 g/dL (ref 6.0–8.5)

## 2015-08-25 LAB — CBC WITH DIFFERENTIAL/PLATELET
BASOS: 0 %
Basophils Absolute: 0 10*3/uL (ref 0.0–0.2)
EOS (ABSOLUTE): 0.2 10*3/uL (ref 0.0–0.4)
Eos: 2 %
HEMATOCRIT: 47.2 % (ref 37.5–51.0)
HEMOGLOBIN: 16 g/dL (ref 12.6–17.7)
IMMATURE GRANS (ABS): 0 10*3/uL (ref 0.0–0.1)
IMMATURE GRANULOCYTES: 0 %
Lymphocytes Absolute: 1.8 10*3/uL (ref 0.7–3.1)
Lymphs: 18 %
MCH: 30 pg (ref 26.6–33.0)
MCHC: 33.9 g/dL (ref 31.5–35.7)
MCV: 88 fL (ref 79–97)
MONOCYTES: 6 %
Monocytes Absolute: 0.6 10*3/uL (ref 0.1–0.9)
NEUTROS PCT: 74 %
Neutrophils Absolute: 7.3 10*3/uL — ABNORMAL HIGH (ref 1.4–7.0)
Platelets: 142 10*3/uL — ABNORMAL LOW (ref 150–379)
RBC: 5.34 x10E6/uL (ref 4.14–5.80)
RDW: 13.6 % (ref 12.3–15.4)
WBC: 10 10*3/uL (ref 3.4–10.8)

## 2015-08-25 NOTE — Telephone Encounter (Signed)
Patient advised as directed below. Per pt Blood Sugar High probably because he ate a sandwich one hour before the blood work. Patient also states that has an appointment scheduled with Maurine Minister on Sept. 16. And said (he will get the blood pressure and heart rate check at that time no need to come back any earlier.)   Thanks,  -Angle Karel

## 2015-08-25 NOTE — Telephone Encounter (Signed)
-----   Message from Tamsen Roers, [place] sent at 08/25/2015 11:42 AM EDT ----- Blood sugar higher, kidney function tests elevated, potassium very low and platelets slightly down. Need to add another tablet of potassium each day. Recheck heart rate and BP in 7-10 days.

## 2015-09-01 ENCOUNTER — Other Ambulatory Visit: Payer: Self-pay

## 2015-09-01 DIAGNOSIS — I5030 Unspecified diastolic (congestive) heart failure: Secondary | ICD-10-CM

## 2015-09-01 NOTE — Telephone Encounter (Signed)
Request received from [facility] pharmacy requesting refills on Furosemide 80 mg and Metolazone 2.5 mg tab.

## 2015-09-02 MED ORDER — METOLAZONE 2.5 MG PO TABS: 2.5000 mg | ORAL_TABLET | Freq: Every day | ORAL | Status: DC | PRN

## 2015-09-02 MED ORDER — FUROSEMIDE 80 MG PO TABS
80.0000 mg | ORAL_TABLET | Freq: Every day | ORAL | Status: DC
Start: 2015-09-02 — End: 2016-07-28

## 2015-09-02 NOTE — Telephone Encounter (Signed)
Dennis sent RX to pharmacy.  

## 2015-09-09 ENCOUNTER — Encounter: Payer: Self-pay | Admitting: Family Medicine

## 2015-09-09 ENCOUNTER — Ambulatory Visit (INDEPENDENT_AMBULATORY_CARE_PROVIDER_SITE_OTHER): Payer: BLUE CROSS/BLUE SHIELD | Admitting: Family Medicine

## 2015-09-09 VITALS — BP 118/84 | HR 82 | Temp 97.9°F | Resp 20 | Wt 383.0 lb

## 2015-09-09 DIAGNOSIS — K625 Hemorrhage of anus and rectum: Secondary | ICD-10-CM

## 2015-09-09 DIAGNOSIS — E876 Hypokalemia: Secondary | ICD-10-CM

## 2015-09-09 DIAGNOSIS — R7309 Other abnormal glucose: Secondary | ICD-10-CM | POA: Diagnosis not present

## 2015-09-09 DIAGNOSIS — R739 Hyperglycemia, unspecified: Secondary | ICD-10-CM

## 2015-09-09 NOTE — Progress Notes (Signed)
Subjective:    Patient ID: Marco James [facility], male    DOB: Jul 04, 1956, 59 y.o.   MRN: [id number]  HPI  This 59 year old male with a history of A.fib.,diastolic CHF, OSA, HBP, morbid obesity and hypokalemia, had bright red rectal bleeding on 08-23-15. He was evaluated by Dr. Sullivan Lone and found his protime INR was 2.1 on the Coumadin 6 mg qd without specific thrombosed hemorrhoid or fissure on DRE. Blood tests showed normal hgb & hct with creatinine 1.49, glucose 164 (non-fasting test) and potassium of 2.6 (had been off the KCL supplement for a while despite use of Lasix 80 mg qd with occasional use of Zaroxolyn 2.5 mg prn edema. Has not had any more rectal bleeding and did not use the Anusol-HC suppositories as prescribed. Concerned because he did not get a call back from the gastroenterologist regarding a colonoscopy that was to be scheduled.  Patient Active Problem List   Diagnosis Date Noted  . Polysubstance abuse 06/13/2015  . Edema, peripheral 06/13/2015  . Extreme obesity 06/13/2015  . Accelerated hypertension 06/13/2015  . Elevated blood uric acid level 06/13/2015  . Essential (primary) hypertension 06/13/2015  . Hypercholesteremia 06/13/2015  . History of prolonged Q-T interval on ECG 06/13/2015  . Fatigue 06/13/2015  . Benign paroxysmal positional nystagmus 06/13/2015  . Clinical depression 06/13/2015  . Arteriosclerosis of coronary artery 06/13/2015  . H/O hypokalemia 06/13/2015  . Atrial fibrillation, unspecified 10/25/2014  . Diastolic CHF, chronic 10/25/2014  . Morbid obesity 10/25/2014  . Bilateral lower extremity edema 10/25/2014  . Shortness of breath 10/25/2014  . Well controlled type 2 diabetes mellitus 10/25/2014  . Apnea, sleep 10/06/2014  . A-fib 10/06/2014  . Bronchitis 10/06/2014  . CCF (congestive cardiac failure) 10/06/2014  . Diabetes mellitus, type 2 10/06/2014  . Dupuytren's disease 10/06/2014  . Edema leg 10/06/2014  . Acid reflux 10/06/2014  . BP (high  blood pressure) 10/06/2014  . HLD (hyperlipidemia) 10/06/2014  . RAD (reactive airway disease) 10/06/2014  . Arthropathia 04/30/2010  . External hemorrhoids without complication 02/02/2009  . Dermatitis, purulent 12/16/2008  . Compulsive tobacco user syndrome 06/11/2007  . Diabetes 12/24/2004  . Drug dependence, in remission 12/24/2004  . Obstructive apnea 12/24/2001  . CAFL (chronic airflow limitation) 08/11/1989   History reviewed. No pertinent past surgical history. Family History  Problem Relation Age of Onset  . Heart disease Father 72   Social History  Substance Use Topics  . Smoking status: Former Smoker -- 1.00 packs/day for 25 years    Types: Cigarettes  . Smokeless tobacco: Never Used  . Alcohol Use: 4.2 oz/week    5 Cans of beer, 2 Shots of liquor per week     Comment: OCCASIONALLY   No Known Allergies Current Outpatient Prescriptions on File Prior to Visit  Medication Sig Dispense Refill  . amLODipine (NORVASC) 10 MG tablet Take 1 tablet (10 mg total) by mouth daily. 30 tablet 6  . doxazosin (CARDURA) 8 MG tablet Take 1 tablet (8 mg total) by mouth daily. 90 tablet 3  . furosemide (LASIX) 80 MG tablet Take 1 tablet (80 mg total) by mouth daily. 90 tablet 3  . glipiZIDE (GLUCOTROL XL) 5 MG 24 hr tablet Take 5 mg by mouth daily with breakfast.    . hydrocortisone (ANUSOL-HC) 25 MG suppository Place 1 suppository (25 mg total) rectally at bedtime. 12 suppository 5  . lisinopril (PRINIVIL,ZESTRIL) 40 MG tablet Take 40 mg by mouth daily.    . metolazone (ZAROXOLYN)  2.5 MG tablet Take 1 tablet (2.5 mg total) by mouth daily as needed. 30 tablet 0  . metoprolol (LOPRESSOR) 100 MG tablet Take 1 tablet (100 mg total) by mouth 2 (two) times daily. 180 tablet 3  . montelukast (SINGULAIR) 10 MG tablet Take 10 mg by mouth at bedtime.    . naproxen (NAPROSYN) 500 MG tablet   0  . NON FORMULARY CPAP    . potassium chloride SA (K-DUR,KLOR-CON) 20 MEQ tablet Take 1 tablet (20 mEq  total) by mouth 2 (two) times daily. 60 tablet 6  . warfarin (COUMADIN) 6 MG tablet Take 1 tablet (6 mg total) by mouth daily. 30 tablet 6   No current facility-administered medications on file prior to visit.   Review of Systems  Constitutional: Negative.  Negative for fatigue.  HENT: Negative.   Respiratory: Negative.   Cardiovascular: Negative.   Gastrointestinal: Negative.  Negative for blood in stool and rectal pain.  Genitourinary: Negative.   Musculoskeletal: Negative.        Denies muscle cramps or weakness.  Neurological: Positive for numbness. Negative for dizziness and weakness.       Occasional stabbing pains in feet with decreased sensation.     BP 118/84 mmHg  Pulse 82  Temp(Src) 97.9 F (36.6 C) (Oral)  Resp 20  Wt 383 lb (173.728 kg)  SpO2 90%  Objective:   Physical Exam  Constitutional: He is oriented to person, place, and time. He appears well-developed and well-nourished. No distress.  Morbid obesity.  HENT:  Head: Normocephalic and atraumatic.  Right Ear: Hearing normal.  Left Ear: Hearing normal.  Nose: Nose normal.  Eyes: Conjunctivae and lids are normal. Right eye exhibits no discharge. Left eye exhibits no discharge. No scleral icterus.  Neck: Neck supple.  Cardiovascular: Normal rate, regular rhythm and normal heart sounds.   Pulmonary/Chest: Effort normal and breath sounds normal. No respiratory distress.  Abdominal: Bowel sounds are normal.  Musculoskeletal: Normal range of motion.  Neurological: He is alert and oriented to person, place, and time.  Skin: Skin is intact. No lesion and no rash noted.  Psychiatric: He has a normal mood and affect. His speech is normal and behavior is normal. Thought content normal.      Assessment & Plan:  1. Rectal bleeding No recurrence since OV on 08-24-15. May use the Anusol-HC prn and hot soaks. Will recheck CBC for further blood loss and ask Maralyn Sago to check on colonoscopy appoiintment. - CBC with  Differential/Platelet  2. Hypokalemia Potassium was 2.6 on 08-24-15. Started Klorcon 20 meq BID a week ago. Will recheck blood levels. - COMPLETE METABOLIC PANEL WITH GFR  3. Elevated blood sugar BS was 164 on 08-24-15 but was not a fasting test. His FBS readings at home have ranged from 90-126. Will continue Glipizide 5 mg qd and encouraged to work on weight loss again. Recheck labs now and get Hgb A1C in 2 months. - COMPLETE METABOLIC PANEL WITH GFR

## 2015-09-20 ENCOUNTER — Telehealth: Payer: Self-pay

## 2015-09-20 ENCOUNTER — Telehealth: Payer: Self-pay | Admitting: Gastroenterology

## 2015-09-20 ENCOUNTER — Other Ambulatory Visit: Payer: Self-pay

## 2015-09-20 NOTE — Telephone Encounter (Signed)
Blood thinner request form sent to Dortha Kern, PA for instructions. Will contact pt once this has been received.

## 2015-09-20 NOTE — Telephone Encounter (Signed)
-----   Message from Armandina Gemma sent at 09/20/2015 11:59 AM EDT ----- Patient has colon 10-25-2015 [facility] and need blood thinner clearance, per patient he doesn't follow a cardiologist and medicine was rx'd by Dortha Kern, When you call patient with info on blood thinner please let him know about other medications when and if he can stop them morning of thank you

## 2015-09-20 NOTE — Telephone Encounter (Signed)
Gastroenterology Pre-Procedure Review  Request Date: 10-25-2015 Requesting Physician: Dr.   PATIENT REVIEW QUESTIONS: The patient responded to the following health history questions as indicated:    1. Are you having any GI issues? no 2. Do you have a personal history of Polyps? no 3. Do you have a family history of Colon Cancer or Polyps? no 4. Diabetes Mellitus? no 5. Joint replacements in the past 12 months?no 6. Major health problems in the past 3 months?no 7. Any artificial heart valves, MVP, or defibrillator?no    MEDICATIONS & ALLERGIES:    Patient reports the following regarding taking any anticoagulation/antiplatelet therapy:   Plavix, Coumadin, Eliquis, Xarelto, Lovenox, Pradaxa, Brilinta, or Effient? YES Aspirin? no  Patient confirms/reports the following medications:  Current Outpatient Prescriptions  Medication Sig Dispense Refill   amLODipine (NORVASC) 10 MG tablet Take 1 tablet (10 mg total) by mouth daily. 30 tablet 6   doxazosin (CARDURA) 8 MG tablet Take 1 tablet (8 mg total) by mouth daily. 90 tablet 3   furosemide (LASIX) 80 MG tablet Take 1 tablet (80 mg total) by mouth daily. 90 tablet 3   glipiZIDE (GLUCOTROL XL) 5 MG 24 hr tablet Take 5 mg by mouth daily with breakfast.     hydrocortisone (ANUSOL-HC) 25 MG suppository Place 1 suppository (25 mg total) rectally at bedtime. 12 suppository 5   lisinopril (PRINIVIL,ZESTRIL) 40 MG tablet Take 40 mg by mouth daily.     metolazone (ZAROXOLYN) 2.5 MG tablet Take 1 tablet (2.5 mg total) by mouth daily as needed. 30 tablet 0   metoprolol (LOPRESSOR) 100 MG tablet Take 1 tablet (100 mg total) by mouth 2 (two) times daily. 180 tablet 3   montelukast (SINGULAIR) 10 MG tablet Take 10 mg by mouth at bedtime.     naproxen (NAPROSYN) 500 MG tablet   0   NON FORMULARY CPAP     potassium chloride SA (K-DUR,KLOR-CON) 20 MEQ tablet Take 1 tablet (20 mEq total) by mouth 2 (two) times daily. 60 tablet 6   warfarin  (COUMADIN) 6 MG tablet Take 1 tablet (6 mg total) by mouth daily. 30 tablet 6   No current facility-administered medications for this visit.    Patient confirms/reports the following allergies:  No Known Allergies  No orders of the defined types were placed in this encounter.    AUTHORIZATION INFORMATION Primary Insurance: 1D#: Group #:  Secondary Insurance: 1D#: Group #:  SCHEDULE INFORMATION: Date: 10-25-2015 Time: Location:[facility]

## 2015-10-07 ENCOUNTER — Telehealth: Payer: Self-pay

## 2015-10-07 LAB — COMPREHENSIVE METABOLIC PANEL
ALBUMIN: 4.4 g/dL (ref 3.5–5.5)
ALK PHOS: 55 IU/L (ref 39–117)
ALT: 17 IU/L (ref 0–44)
AST: 16 IU/L (ref 0–40)
Albumin/Globulin Ratio: 2.2 (ref 1.1–2.5)
BILIRUBIN TOTAL: 0.8 mg/dL (ref 0.0–1.2)
BUN / CREAT RATIO: 14 (ref 9–20)
BUN: 19 mg/dL (ref 6–24)
CHLORIDE: 91 mmol/L — AB (ref 97–108)
CO2: 32 mmol/L — ABNORMAL HIGH (ref 18–29)
Calcium: 8.9 mg/dL (ref 8.7–10.2)
Creatinine, Ser: 1.34 mg/dL — ABNORMAL HIGH (ref 0.76–1.27)
GFR calc Af Amer: 68 mL/min/{1.73_m2} (ref >59)
GFR calc non Af Amer: 58 mL/min/{1.73_m2} — ABNORMAL LOW (ref >59)
GLOBULIN, TOTAL: 2 g/dL (ref 1.5–4.5)
GLUCOSE: 142 mg/dL — AB (ref 65–99)
Potassium: 3.4 mmol/L — ABNORMAL LOW (ref 3.5–5.2)
SODIUM: 140 mmol/L (ref 134–144)
Total Protein: 6.4 g/dL (ref 6.0–8.5)

## 2015-10-07 LAB — CBC WITH DIFFERENTIAL/PLATELET
Basophils Absolute: 0 10*3/uL (ref 0.0–0.2)
Basos: 0 %
EOS (ABSOLUTE): 0.1 10*3/uL (ref 0.0–0.4)
Eos: 1 %
Hematocrit: 45.7 % (ref 37.5–51.0)
Hemoglobin: 16.5 g/dL (ref 12.6–17.7)
Immature Grans (Abs): 0 10*3/uL (ref 0.0–0.1)
Immature Granulocytes: 0 %
Lymphocytes Absolute: 1.7 10*3/uL (ref 0.7–3.1)
Lymphs: 17 %
MCH: 32.4 pg (ref 26.6–33.0)
MCHC: 36.1 g/dL — ABNORMAL HIGH (ref 31.5–35.7)
MCV: 90 fL (ref 79–97)
Monocytes Absolute: 0.6 10*3/uL (ref 0.1–0.9)
Monocytes: 6 %
Neutrophils Absolute: 7.3 10*3/uL — ABNORMAL HIGH (ref 1.4–7.0)
Neutrophils: 76 %
Platelets: 161 10*3/uL (ref 150–379)
RBC: 5.1 x10E6/uL (ref 4.14–5.80)
RDW: 14.4 % (ref 12.3–15.4)
WBC: 9.7 10*3/uL (ref 3.4–10.8)

## 2015-10-07 NOTE — Telephone Encounter (Signed)
LMTCB

## 2015-10-07 NOTE — Telephone Encounter (Signed)
-----   Message from Tamsen Roersennis E Chrismon, [place] sent at 10/07/2015 11:24 AM EDT ----- No sign of anemia or infections on CBC. Blood sugar, kidney function and potassium improving. Continue present medication regimen and recheck levels in 3-4 months.

## 2015-10-10 ENCOUNTER — Telehealth: Payer: Self-pay

## 2015-10-10 ENCOUNTER — Ambulatory Visit: Payer: BLUE CROSS/BLUE SHIELD | Admitting: Family Medicine

## 2015-10-10 NOTE — Telephone Encounter (Signed)
Patient advised as directed below. Patient verbalized understanding and agrees with plan of care. Patient scheduled for a 3 month follow up on 01/10/2016.

## 2015-10-10 NOTE — Telephone Encounter (Signed)
Called pt to inform him he will need to stop his Warfarin per Dortha Kernennis Chrismon, PA 3 days prior to colonoscopy and resume 2 days after. Pt understood and had no further questions.

## 2015-10-17 ENCOUNTER — Telehealth: Payer: Self-pay | Admitting: Family Medicine

## 2015-10-17 MED ORDER — ALBUTEROL SULFATE HFA 108 (90 BASE) MCG/ACT IN AERS: 2.0000 | INHALATION_SPRAY | Freq: Four times a day (QID) | RESPIRATORY_TRACT | Status: DC | PRN

## 2015-10-17 NOTE — Telephone Encounter (Signed)
Please review-aa 

## 2015-10-17 NOTE — Telephone Encounter (Signed)
Pt contacted office for refill request on the following medications:  Ventolin inhaler.  [facility].  CB#[phone]/MW

## 2015-10-18 ENCOUNTER — Other Ambulatory Visit: Payer: Self-pay | Admitting: Family Medicine

## 2015-10-18 MED ORDER — NAPROXEN 500 MG PO TABS: 500.0000 mg | ORAL_TABLET | Freq: Two times a day (BID) | ORAL | Status: DC

## 2015-10-24 ENCOUNTER — Encounter: Payer: Self-pay | Admitting: *Deleted

## 2015-10-25 ENCOUNTER — Encounter
Admission: RE | Disposition: A | Discharge status: Home / Self Care | Payer: Self-pay | Source: Ambulatory Visit | Attending: Gastroenterology

## 2015-10-25 ENCOUNTER — Encounter: Payer: Self-pay | Admitting: Family Medicine

## 2015-10-25 ENCOUNTER — Ambulatory Visit (INDEPENDENT_AMBULATORY_CARE_PROVIDER_SITE_OTHER): Payer: BLUE CROSS/BLUE SHIELD | Admitting: Family Medicine

## 2015-10-25 ENCOUNTER — Ambulatory Visit
Admission: RE | Admit: 2015-10-25 | Discharge: 2015-10-25 | Disposition: A | Discharge status: Home / Self Care | Payer: BLUE CROSS/BLUE SHIELD | Source: Ambulatory Visit | Attending: Gastroenterology | Admitting: Gastroenterology

## 2015-10-25 ENCOUNTER — Ambulatory Visit: Payer: BLUE CROSS/BLUE SHIELD | Admitting: Anesthesiology

## 2015-10-25 VITALS — BP 156/98 | HR 102 | Temp 97.8°F | Resp 20

## 2015-10-25 DIAGNOSIS — F17210 Nicotine dependence, cigarettes, uncomplicated: Secondary | ICD-10-CM | POA: Insufficient documentation

## 2015-10-25 DIAGNOSIS — I10 Essential (primary) hypertension: Secondary | ICD-10-CM | POA: Diagnosis not present

## 2015-10-25 DIAGNOSIS — E119 Type 2 diabetes mellitus without complications: Secondary | ICD-10-CM | POA: Diagnosis not present

## 2015-10-25 DIAGNOSIS — J449 Chronic obstructive pulmonary disease, unspecified: Secondary | ICD-10-CM | POA: Insufficient documentation

## 2015-10-25 DIAGNOSIS — R0602 Shortness of breath: Secondary | ICD-10-CM | POA: Diagnosis not present

## 2015-10-25 DIAGNOSIS — G4733 Obstructive sleep apnea (adult) (pediatric): Secondary | ICD-10-CM | POA: Insufficient documentation

## 2015-10-25 DIAGNOSIS — Z7901 Long term (current) use of anticoagulants: Secondary | ICD-10-CM | POA: Insufficient documentation

## 2015-10-25 DIAGNOSIS — Z79899 Other long term (current) drug therapy: Secondary | ICD-10-CM | POA: Diagnosis not present

## 2015-10-25 DIAGNOSIS — J209 Acute bronchitis, unspecified: Secondary | ICD-10-CM

## 2015-10-25 DIAGNOSIS — I509 Heart failure, unspecified: Secondary | ICD-10-CM | POA: Insufficient documentation

## 2015-10-25 DIAGNOSIS — E785 Hyperlipidemia, unspecified: Secondary | ICD-10-CM | POA: Diagnosis not present

## 2015-10-25 DIAGNOSIS — K219 Gastro-esophageal reflux disease without esophagitis: Secondary | ICD-10-CM | POA: Diagnosis not present

## 2015-10-25 DIAGNOSIS — K644 Residual hemorrhoidal skin tags: Secondary | ICD-10-CM | POA: Insufficient documentation

## 2015-10-25 DIAGNOSIS — M720 Palmar fascial fibromatosis [Dupuytren]: Secondary | ICD-10-CM | POA: Diagnosis not present

## 2015-10-25 DIAGNOSIS — I4891 Unspecified atrial fibrillation: Secondary | ICD-10-CM | POA: Insufficient documentation

## 2015-10-25 DIAGNOSIS — R0600 Dyspnea, unspecified: Secondary | ICD-10-CM | POA: Diagnosis not present

## 2015-10-25 DIAGNOSIS — Z8249 Family history of ischemic heart disease and other diseases of the circulatory system: Secondary | ICD-10-CM | POA: Insufficient documentation

## 2015-10-25 DIAGNOSIS — K5730 Diverticulosis of large intestine without perforation or abscess without bleeding: Secondary | ICD-10-CM | POA: Diagnosis not present

## 2015-10-25 DIAGNOSIS — Z9889 Other specified postprocedural states: Secondary | ICD-10-CM | POA: Diagnosis not present

## 2015-10-25 DIAGNOSIS — B351 Tinea unguium: Secondary | ICD-10-CM | POA: Diagnosis not present

## 2015-10-25 DIAGNOSIS — Z1211 Encounter for screening for malignant neoplasm of colon: Secondary | ICD-10-CM | POA: Diagnosis not present

## 2015-10-25 DIAGNOSIS — E790 Hyperuricemia without signs of inflammatory arthritis and tophaceous disease: Secondary | ICD-10-CM | POA: Insufficient documentation

## 2015-10-25 DIAGNOSIS — Z7951 Long term (current) use of inhaled steroids: Secondary | ICD-10-CM | POA: Insufficient documentation

## 2015-10-25 DIAGNOSIS — Z87898 Personal history of other specified conditions: Secondary | ICD-10-CM | POA: Insufficient documentation

## 2015-10-25 HISTORY — DX: Pneumonia, unspecified organism: J18.9

## 2015-10-25 HISTORY — PX: COLONOSCOPY WITH PROPOFOL: SHX5780

## 2015-10-25 SURGERY — COLONOSCOPY WITH PROPOFOL
Anesthesia: General

## 2015-10-25 MED ORDER — PROPOFOL 500 MG/50ML IV EMUL
INTRAVENOUS | Status: DC | PRN
  Administered 2015-10-25: 120 ug/kg/min via INTRAVENOUS

## 2015-10-25 MED ORDER — PROMETHAZINE-DM 6.25-15 MG/5ML PO SYRP
5.0000 mL | ORAL_SOLUTION | Freq: Four times a day (QID) | ORAL | Status: DC | PRN
Start: 2015-10-25 — End: 2015-11-03

## 2015-10-25 MED ORDER — IPRATROPIUM-ALBUTEROL 0.5-2.5 (3) MG/3ML IN SOLN
3.0000 mL | Freq: Four times a day (QID) | RESPIRATORY_TRACT | Status: DC
  Administered 2015-10-25: 3 mL via OROMUCOSAL
  Filled 2015-10-25: qty 3

## 2015-10-25 MED ORDER — IPRATROPIUM-ALBUTEROL 0.5-2.5 (3) MG/3ML IN SOLN
3.0000 mL | Freq: Four times a day (QID) | RESPIRATORY_TRACT | Status: DC
  Administered 2015-10-25: 3 mL via RESPIRATORY_TRACT

## 2015-10-25 MED ORDER — PROPOFOL 10 MG/ML IV BOLUS
INTRAVENOUS | Status: DC | PRN
  Administered 2015-10-25: 110 mg via INTRAVENOUS

## 2015-10-25 MED ORDER — AMOXICILLIN 875 MG PO TABS: 875.0000 mg | ORAL_TABLET | Freq: Two times a day (BID) | ORAL | Status: DC

## 2015-10-25 MED ORDER — EPHEDRINE SULFATE 50 MG/ML IJ SOLN
INTRAMUSCULAR | Status: DC | PRN
  Administered 2015-10-25: 10 mg via INTRAVENOUS

## 2015-10-25 MED ORDER — SODIUM CHLORIDE 0.9 % IV SOLN
INTRAVENOUS | Status: DC
  Administered 2015-10-25: 1000 mL via INTRAVENOUS
  Administered 2015-10-25: 08:00:00 via INTRAVENOUS

## 2015-10-25 MED ORDER — IPRATROPIUM-ALBUTEROL 0.5-2.5 (3) MG/3ML IN SOLN
RESPIRATORY_TRACT | Status: AC
  Administered 2015-10-25: 3 mL via OROMUCOSAL
  Filled 2015-10-25: qty 3

## 2015-10-25 NOTE — Anesthesia Postprocedure Evaluation (Signed)
  Anesthesia Post-op Note  Patient: Marco James  Procedure(s) Performed: Procedure(s): COLONOSCOPY WITH PROPOFOL (N/A)  Anesthesia type:General  Patient location: PACU  Post pain: Pain level controlled  Post assessment: Post-op Vital signs reviewed, Patient's Cardiovascular Status Stable, Respiratory Function Stable, Patent Airway and No signs of Nausea or vomiting  Post vital signs: Reviewed and stable  Last Vitals:  Filed Vitals:   10/25/15 0930  BP: 136/79  Pulse: 77  Temp:   Resp: 14    Level of consciousness: awake, alert  and patient cooperative  Complications: No apparent anesthesia complications

## 2015-10-25 NOTE — Transfer of Care (Signed)
Immediate Anesthesia Transfer of Care Note  [facility]  Procedure(s) Performed: Procedure(s): COLONOSCOPY WITH PROPOFOL (N/A)  Patient Location: PACU and Endoscopy Unit  Anesthesia Type:General  Level of Consciousness: patient cooperative and lethargic  Airway & Oxygen Therapy: Patient Spontanous Breathing and Patient connected to face mask oxygen  Post-op Assessment: Report given to RN and Post -op Vital signs reviewed and stable  Post vital signs: Reviewed and stable  Last Vitals:  Filed Vitals:   10/25/15 0844  BP: 123/100  Pulse: 79  Temp: 36.3 C  Resp: 22    Complications: No apparent anesthesia complications

## 2015-10-25 NOTE — Progress Notes (Signed)
[facility]   DOB: 04-20-1956, 59 y.o.   MRN: [id number] [facility]   MRN: [id number]    DOB: 04-20-1956   Date:10/25/2015       Progress Note  Subjective  Chief Complaint  Chief Complaint  Patient presents with  . URI    URI  This is a new problem. The current episode started 1 to 4 weeks ago. The problem has been gradually worsening. There has been no fever. Associated symptoms include congestion, coughing, headaches, rhinorrhea, sneezing, a sore throat and wheezing. Associated symptoms comments: bodyaches. He has tried nothing for the symptoms.  States he had a screening colonoscopy today and given "a couple breathing treatments prior to the procedure".  Past Medical History  Diagnosis Date  . HTN (hypertension)   . Reactive airway disease   . Hyperlipidemia   . Type 2 diabetes mellitus (HCC)   . GERD (gastroesophageal reflux disease)   . Toenail fungus   . Polysubstance abuse   . A-fib (HCC)   . Arrhythmia   . Hyperuricemia   . Chronic airway obstruction (HCC)   . Obstructive sleep apnea   . CHF (congestive heart failure) (HCC)   . Cellulitis and abscess of leg 2009  . External hemorrhoids   . Dupuytren's disease   . Shortness of breath dyspnea   . Pneumonia    Social History  Substance Use Topics  . Smoking status: Current Every Day Smoker -- 0.50 packs/day for 25 years    Types: Cigarettes  . Smokeless tobacco: Never Used  . Alcohol Use: 4.2 oz/week    5 Cans of beer, 2 Shots of liquor per week     Comment: OCCASIONALLY   Past Surgical History  Procedure Laterality Date  . Nasal sinus surgery    . Wisdom tooth extraction    . Colonoscopy with propofol N/A 10/25/2015    Procedure: COLONOSCOPY WITH PROPOFOL;  Surgeon: Midge Miniumarren Wohl, MD;  Location: [facility] ENDOSCOPY;  Service: Endoscopy;  Laterality: N/A;   Family History  Problem Relation Age of Onset  . Heart disease Father 2377     Current outpatient prescriptions:  .  albuterol  (PROVENTIL HFA;VENTOLIN HFA) 108 (90 BASE) MCG/ACT inhaler, Inhale 2 puffs into the lungs every 6 (six) hours as needed for wheezing or shortness of breath., Disp: 1 Inhaler, Rfl: 2 .  amLODipine (NORVASC) 10 MG tablet, Take 1 tablet (10 mg total) by mouth daily., Disp: 30 tablet, Rfl: 6 .  doxazosin (CARDURA) 8 MG tablet, Take 1 tablet (8 mg total) by mouth daily., Disp: 90 tablet, Rfl: 3 .  furosemide (LASIX) 80 MG tablet, Take 1 tablet (80 mg total) by mouth daily., Disp: 90 tablet, Rfl: 3 .  glipiZIDE (GLUCOTROL XL) 5 MG 24 hr tablet, Take 5 mg by mouth daily with breakfast., Disp: , Rfl:  .  hydrocortisone (ANUSOL-HC) 25 MG suppository, Place 1 suppository (25 mg total) rectally at bedtime., Disp: 12 suppository, Rfl: 5 .  lisinopril (PRINIVIL,ZESTRIL) 40 MG tablet, Take 40 mg by mouth daily., Disp: , Rfl:  .  metolazone (ZAROXOLYN) 2.5 MG tablet, Take 1 tablet (2.5 mg total) by mouth daily as needed., Disp: 30 tablet, Rfl: 0 .  metoprolol (LOPRESSOR) 100 MG tablet, Take 1 tablet (100 mg total) by mouth 2 (two) times daily., Disp: 180 tablet, Rfl: 3 .  montelukast (SINGULAIR) 10 MG tablet, Take 10 mg by mouth at bedtime., Disp: , Rfl:  .  naproxen (NAPROSYN) 500 MG  tablet, Take 1 tablet (500 mg total) by mouth 2 (two) times daily with a meal., Disp: 180 tablet, Rfl: 0 .  NON FORMULARY, CPAP, Disp: , Rfl:  .  potassium chloride SA (K-DUR,KLOR-CON) 20 MEQ tablet, Take 1 tablet (20 mEq total) by mouth 2 (two) times daily., Disp: 60 tablet, Rfl: 6 .  warfarin (COUMADIN) 6 MG tablet, Take 1 tablet (6 mg total) by mouth daily., Disp: 30 tablet, Rfl: 6  No Known Allergies  Review of Systems  Constitutional: Negative.   HENT: Positive for congestion, rhinorrhea, sneezing and sore throat.   Eyes: Negative.   Respiratory: Positive for cough and wheezing.   Cardiovascular: Negative.   Gastrointestinal: Negative.   Genitourinary: Negative.   Musculoskeletal: Negative.   Skin: Negative.    Neurological: Positive for headaches.  Endo/Heme/Allergies: Negative.   Psychiatric/Behavioral: Negative.    Objective  Filed Vitals:   10/25/15 1611  BP: 156/98  Pulse: 102  Temp: 97.8 F (36.6 C)  TempSrc: Oral  Resp: 20  SpO2: 93%   Physical Exam  Constitutional: He is oriented to person, place, and time.  Morbid obesity.  HENT:  Head: Normocephalic and atraumatic.  Left Ear: External ear normal.  Swollen turbinates and reddened throat with ulceration on soft palate. No tenderness of sinuses.  Eyes: Conjunctivae and EOM are normal.  Slightly injected conjuntivae.  Neck: Normal range of motion. Neck supple.  Cardiovascular: Normal rate and normal heart sounds.   Pulmonary/Chest: He has no wheezes. He has no rales.  Coarse breath sounds.   Abdominal: Bowel sounds are normal.  Slightly hyperactive.  Lymphadenopathy:    He has no cervical adenopathy.  Neurological: He is alert and oriented to person, place, and time.   Assessment & Plan   1. Acute bronchitis, unspecified organism Onset over the past week or two. Progression of malaise and cough since having colonoscopy this morning. Has a history of smoking 1/2 ppd and asthma intermittently. Was given a nebulizer treatment twice at the hospital before the colonoscopy. May use Albuterol MDI he has at home if needed. Will treat with antibiotic and cough syrup. Encouraged to get extra liquids and soft diet tonight. May add Tylenol prn aches or pains. Recheck if no better in 5 days. - amoxicillin (AMOXIL) 875 MG tablet; Take 1 tablet (875 mg total) by mouth 2 (two) times daily.  Dispense: 20 tablet; Refill: 0 - promethazine-dextromethorphan (PROMETHAZINE-DM) 6.25-15 MG/5ML syrup; Take 5 mLs by mouth 4 (four) times daily as needed for cough.  Dispense: 240 mL; Refill: 0

## 2015-10-25 NOTE — Anesthesia Preprocedure Evaluation (Addendum)
Anesthesia Evaluation  Patient identified by MRN, date of birth, ID band Patient awake    Reviewed: Allergy & Precautions, H&P , NPO status , Patient's Chart, lab work & pertinent test results  History of Anesthesia Complications Negative for: history of anesthetic complications  Airway Mallampati: III  TM Distance: >3 FB Neck ROM: limited    Dental  (+) Poor Dentition, Chipped   Pulmonary shortness of breath and with exertion, sleep apnea , pneumonia, resolved, COPD, Current Smoker,     + decreased breath sounds+ wheezing      Cardiovascular Exercise Tolerance: Poor hypertension, (-) angina+ CAD, +CHF and + DOE  (-) Past MI Normal cardiovascular exam+ dysrhythmias  Rhythm:regular Rate:Normal     Neuro/Psych PSYCHIATRIC DISORDERS Depression negative neurological ROS     GI/Hepatic Neg liver ROS, GERD  Controlled,  Endo/Other  diabetes, Type 2  Renal/GU negative Renal ROS  negative genitourinary   Musculoskeletal   Abdominal   Peds  Hematology negative hematology ROS (+)   Anesthesia Other Findings Past Medical History:   HTN (hypertension)                                           Reactive airway disease                                      Hyperlipidemia                                               Type 2 diabetes mellitus (HCC)                               GERD (gastroesophageal reflux disease)                       Toenail fungus                                               Polysubstance abuse                                          A-fib (HCC)                                                  Arrhythmia                                                   Hyperuricemia  Chronic airway obstruction (HCC)                             Obstructive sleep apnea                                      CHF (congestive heart failure) (HCC)                         Cellulitis  and abscess of leg                   2009         External hemorrhoids                                         Dupuytren's disease                                          Shortness of breath dyspnea                                  Pneumonia                                                   Past Surgical History:   NASAL SINUS SURGERY                                           WISDOM TOOTH EXTRACTION                                      BMI    Body Mass Index   50.84 kg/m 2  Super morbid     Reproductive/Obstetrics negative OB ROS                            Anesthesia Physical Anesthesia Plan  ASA: III  Anesthesia Plan: General   Post-op Pain Management:    Induction:   Airway Management Planned:   Additional Equipment:   Intra-op Plan:   Post-operative Plan:   Informed Consent: I have reviewed the patients History and Physical, chart, labs and discussed the procedure including the risks, benefits and alternatives for the proposed anesthesia with the patient or authorized representative who has indicated his/her understanding and acceptance.   Dental Advisory Given  Plan Discussed with: Anesthesiologist, CRNA and Surgeon  Anesthesia Plan Comments: (Patient informed that they are higher risk for complications from anesthesia during this procedure due to their medical history.  Patient voiced understanding. )        Anesthesia Quick Evaluation

## 2015-10-25 NOTE — Op Note (Signed)
[facility] Gastroenterology Patient Name: Marco James Procedure Date: 10/25/2015 8:20 AM MRN: [id number] Account #: [id number] Date of Birth: 19-Nov-1956 Admit Type: Outpatient Age: 59 Room: [facility] ENDO ROOM 4 Gender: Male Note Status: Finalized Procedure:         Colonoscopy Indications:       Screening for colorectal malignant neoplasm Providers:         Midge Miniumarren Quintarius Ferns, MD Referring MD:      Demetrios Isaacsonald E. Sherrie MustacheFisher, MD (Referring MD) Medicines:         Propofol per Anesthesia Complications:     No immediate complications. Procedure:         Pre-Anesthesia Assessment:                    - Prior to the procedure, a History and Physical was                     performed, and patient medications and allergies were                     reviewed. The patient's tolerance of previous anesthesia                     was also reviewed. The risks and benefits of the procedure                     and the sedation options and risks were discussed with the                     patient. All questions were answered, and informed consent                     was obtained. Prior Anticoagulants: The patient has taken                     no previous anticoagulant or antiplatelet agents. ASA                     Grade Assessment: II - A patient with mild systemic                     disease. After reviewing the risks and benefits, the                     patient was deemed in satisfactory condition to undergo                     the procedure.                    After obtaining informed consent, the colonoscope was                     passed under direct vision. Throughout the procedure, the                     patient's blood pressure, pulse, and oxygen saturations                     were monitored continuously. The Colonoscope was                     introduced through the anus and advanced to the the cecum,  identified by appendiceal orifice and ileocecal valve. The               colonoscopy was performed without difficulty. The patient                     tolerated the procedure well. The quality of the bowel                     preparation was fair. Findings:      The perianal and digital rectal examinations were normal.      A few small-mouthed diverticula were found in the sigmoid colon. Impression:        - Diverticulosis in the sigmoid colon.                    - No specimens collected. Recommendation:    - Repeat colonoscopy in 10 years for screening unless any                     change in family history or lower GI problems. Procedure Code(s): --- Professional ---                    [id number], Colonoscopy, flexible; diagnostic, including                     collection of specimen(s) by brushing or washing, when                     performed (separate procedure) Diagnosis Code(s): --- Professional ---                    Z12.11, Encounter for screening for malignant neoplasm of                     colon                    K57.30, Diverticulosis of large intestine without                     perforation or abscess without bleeding CPT copyright 2014 American Medical Association. All rights reserved. The codes documented in this report are preliminary and upon coder review may  be revised to meet current compliance requirements. Midge Minium, MD 10/25/2015 8:39:35 AM This report has been signed electronically. Number of Addenda: 0 Note Initiated On: 10/25/2015 8:20 AM Scope Withdrawal Time: 0 hours 7 minutes 56 seconds  Total Procedure Duration: 0 hours 8 minutes 40 seconds       [facility]

## 2015-10-25 NOTE — H&P (Signed)
[facility]  [address] [place], [place] [address] Phone: [phone] Fax : [phone]  Primary Care Physician:  Mila Merry, MD Primary Gastroenterologist:  Dr. Servando Snare  Pre-Procedure History & Physical: HPI:  Marco James is a 59 y.o. male is here for a screening colonoscopy.   Past Medical History  Diagnosis Date  . HTN (hypertension)   . Reactive airway disease   . Hyperlipidemia   . Type 2 diabetes mellitus (HCC)   . GERD (gastroesophageal reflux disease)   . Toenail fungus   . Polysubstance abuse   . A-fib (HCC)   . Arrhythmia   . Hyperuricemia   . Chronic airway obstruction (HCC)   . Obstructive sleep apnea   . CHF (congestive heart failure) (HCC)   . Cellulitis and abscess of leg 2009  . External hemorrhoids   . Dupuytren's disease   . Shortness of breath dyspnea   . Pneumonia     Past Surgical History  Procedure Laterality Date  . Nasal sinus surgery    . Wisdom tooth extraction      Prior to Admission medications   Medication Sig Start Date End Date Taking? Authorizing Provider  albuterol (PROVENTIL HFA;VENTOLIN HFA) 108 (90 BASE) MCG/ACT inhaler Inhale 2 puffs into the lungs every 6 (six) hours as needed for wheezing or shortness of breath. 10/17/15   Jodell Cipro Chrismon, PA  amLODipine (NORVASC) 10 MG tablet Take 1 tablet (10 mg total) by mouth daily. 07/07/15   Jodell Cipro Chrismon, PA  doxazosin (CARDURA) 8 MG tablet Take 1 tablet (8 mg total) by mouth daily. 10/26/14   Antonieta Iba, MD  furosemide (LASIX) 80 MG tablet Take 1 tablet (80 mg total) by mouth daily. 09/02/15   Jodell Cipro Chrismon, PA  glipiZIDE (GLUCOTROL XL) 5 MG 24 hr tablet Take 5 mg by mouth daily with breakfast.    Historical Provider, MD  hydrocortisone (ANUSOL-HC) 25 MG suppository Place 1 suppository (25 mg total) rectally at bedtime. 08/24/15   Richard Hulen Shouts., MD  lisinopril (PRINIVIL,ZESTRIL) 40 MG tablet Take 40 mg by mouth daily.    Historical Provider,  MD  metolazone (ZAROXOLYN) 2.5 MG tablet Take 1 tablet (2.5 mg total) by mouth daily as needed. 09/02/15   Jodell Cipro Chrismon, PA  metoprolol (LOPRESSOR) 100 MG tablet Take 1 tablet (100 mg total) by mouth 2 (two) times daily. 07/04/15   Jodell Cipro Chrismon, PA  montelukast (SINGULAIR) 10 MG tablet Take 10 mg by mouth at bedtime.    Historical Provider, MD  naproxen (NAPROSYN) 500 MG tablet Take 1 tablet (500 mg total) by mouth 2 (two) times daily with a meal. 10/18/15   Jodell Cipro Chrismon, PA  NON FORMULARY CPAP    Historical Provider, MD  potassium chloride SA (K-DUR,KLOR-CON) 20 MEQ tablet Take 1 tablet (20 mEq total) by mouth 2 (two) times daily. 06/13/15   Jodell Cipro Chrismon, PA  warfarin (COUMADIN) 6 MG tablet Take 1 tablet (6 mg total) by mouth daily. 07/07/15   Jodell Cipro Chrismon, PA    Allergies as of 09/20/2015  . (No Known Allergies)    Family History  Problem Relation Age of Onset  . Heart disease Father 53    Social History   Social History  . Marital Status: Divorced    Spouse Name: N/A  . Number of Children: N/A  . Years of Education: N/A   Occupational History  . Not on file.   Social History Main  Topics  . Smoking status: Current Every Day Smoker -- 0.50 packs/day for 25 years    Types: Cigarettes  . Smokeless tobacco: Never Used  . Alcohol Use: 4.2 oz/week    5 Cans of beer, 2 Shots of liquor per week     Comment: OCCASIONALLY  . Drug Use: Yes    Special: Cocaine, Marijuana     Comment: Pt. used cocaine for 3 years, marijuana  . Sexual Activity: Not on file   Other Topics Concern  . Not on file   Social History Narrative    Review of Systems: See HPI, otherwise negative ROS  Physical Exam: BP 149/87 mmHg  Pulse 82  Temp(Src) 97.8 F (36.6 C) (Tympanic)  Resp 22  Ht 6' (1.829 m)  Wt 375 lb (170.099 kg)  BMI 50.85 kg/m2  SpO2 93% General:   Alert,  pleasant and cooperative in NAD Head:  Normocephalic and atraumatic. Neck:  Supple; no masses or  thyromegaly. Lungs:  Diffuse rhonchi.    Heart:  Regular rate and rhythm. Abdomen:  Soft, nontender and nondistended. Normal bowel sounds, without guarding, and without rebound.   Neurologic:  Alert and  oriented x4;  grossly normal neurologically.  Impression/Plan: Marco James is now here to undergo a screening colonoscopy.  Risks, benefits, and alternatives regarding colonoscopy have been reviewed with the patient.  Questions have been answered.  All parties agreeable.

## 2015-10-25 NOTE — OR Nursing (Signed)
Patient very agitated and 'just wants to sleep'. Encouraged to cough and deep breathe. Duoneb tx repeated. Trial off o2. Patient to see family md. Today. Dr Casimer Laniuspistacalli in to check on pt. To be d/c home if sao2 above 90%. Sao2 at 90%. DR Pistacalli d/ced pt. Pt instructed to call md today, use is. Device every hour and if problems breathing call 911. D/c home to care of father.

## 2015-10-31 ENCOUNTER — Telehealth: Payer: Self-pay | Admitting: Family Medicine

## 2015-10-31 MED ORDER — BENZONATATE 200 MG PO CAPS: 200.0000 mg | ORAL_CAPSULE | Freq: Three times a day (TID) | ORAL | Status: DC | PRN

## 2015-10-31 NOTE — Telephone Encounter (Signed)
Pt states he seen Maurine Ministerennis last week and still has congestion and cough.  Pt is requesting something else to help with this.  [facility].  CB#[phone]/MW

## 2015-10-31 NOTE — Telephone Encounter (Signed)
Will send Benzonatate to the pharmacy. Should recheck for need of steroid or nebulizer treatment if no better in 5-7 days (sooner if needed).

## 2015-11-01 NOTE — Telephone Encounter (Signed)
Left patient a message on voicemail advising him that a different RX has been sent to the pharmacy, and if no better to call back for a different treatment plan.

## 2015-11-03 ENCOUNTER — Encounter: Payer: Self-pay | Admitting: Family Medicine

## 2015-11-03 ENCOUNTER — Ambulatory Visit (INDEPENDENT_AMBULATORY_CARE_PROVIDER_SITE_OTHER): Payer: BLUE CROSS/BLUE SHIELD | Admitting: Family Medicine

## 2015-11-03 VITALS — BP 114/80 | HR 76 | Temp 98.5°F | Resp 20

## 2015-11-03 DIAGNOSIS — J40 Bronchitis, not specified as acute or chronic: Secondary | ICD-10-CM | POA: Diagnosis not present

## 2015-11-03 DIAGNOSIS — R0902 Hypoxemia: Secondary | ICD-10-CM | POA: Diagnosis not present

## 2015-11-03 MED ORDER — PREDNISONE 20 MG PO TABS: ORAL_TABLET | ORAL | Status: DC

## 2015-11-03 MED ORDER — ALBUTEROL SULFATE (2.5 MG/3ML) 0.083% IN NEBU
2.5000 mg | INHALATION_SOLUTION | Freq: Once | RESPIRATORY_TRACT | Status: AC
  Administered 2015-11-03: 2.5 mg via RESPIRATORY_TRACT

## 2015-11-03 MED ORDER — CEFDINIR 300 MG PO CAPS: 300.0000 mg | ORAL_CAPSULE | Freq: Two times a day (BID) | ORAL | Status: DC

## 2015-11-03 MED ORDER — METHYLPREDNISOLONE ACETATE 80 MG/ML IJ SUSP
80.0000 mg | Freq: Once | INTRAMUSCULAR | Status: AC
  Administered 2015-11-03: 80 mg via INTRAMUSCULAR

## 2015-11-03 NOTE — Patient Instructions (Addendum)
Add an expectorant like Mucinex. If breathing not improving in the next 12-24 hours to report to the ER. Start new antibiotic, cefdinir, if fever, chills or increased sputum production.

## 2015-11-03 NOTE — Progress Notes (Signed)
Subjective:     Patient ID: Marco James, male   DOB: 1956-11-15, [age] y.o.   MRN: [id number]  HPI  Chief Complaint  Patient presents with  . Wheezing    Patient comes in office today with concerns of cough, shortness of breath and wheezing for two weeks, he states that he has used rescue inhaler and cough syrup with no relief.   Here in f/u of o.v. Of 11/1 for bronchitis. States he is coughing up less phlegm and has not had fever or chills. Reports increased shortness of breath with wheezing despite albuterol use.   Review of Systems     Objective:   Physical Exam  Constitutional: He appears well-developed and well-nourished. He has a sickly appearance. No distress.  Pulmonary/Chest:  Diminished breath sounds bilaterally with inspiratory/expiratory wheezing.       Assessment:    1. Bronchitis - predniSONE (DELTASONE) 20 MG tablet; Taper as follows: 3 pills for 4 days, two pills for 4 days, one pill for four days  Dispense: 24 tablet; Refill: 0 - albuterol (PROVENTIL) (2.5 MG/3ML) 0.083% nebulizer solution 2.5 mg; Take 3 mLs (2.5 mg total) by nebulization once. - methylPREDNISolone acetate (DEPO-MEDROL) injection 80 mg; Inject 1 mL (80 mg total) into the muscle once. - cefdinir (OMNICEF) 300 MG capsule; Take 1 capsule (300 mg total) by mouth 2 (two) times daily.  Dispense: 20 capsule; Refill: 0  2. Hypoxemia - methylPREDNISolone acetate (DEPO-MEDROL) injection 80 mg; Inject 1 mL (80 mg total) into the muscle once.    Plan:    Start antibiotic if fever, chills or increased sputum production. ER evaluation if worsening shortness of breath.

## 2015-11-09 ENCOUNTER — Encounter: Payer: Self-pay | Admitting: Cardiovascular Disease

## 2015-11-09 ENCOUNTER — Ambulatory Visit (INDEPENDENT_AMBULATORY_CARE_PROVIDER_SITE_OTHER): Payer: BLUE CROSS/BLUE SHIELD | Admitting: Cardiovascular Disease

## 2015-11-09 VITALS — BP 142/90 | HR 82 | Ht 72.0 in | Wt 383.2 lb

## 2015-11-09 DIAGNOSIS — I482 Chronic atrial fibrillation: Secondary | ICD-10-CM | POA: Diagnosis not present

## 2015-11-09 DIAGNOSIS — R609 Edema, unspecified: Secondary | ICD-10-CM

## 2015-11-09 DIAGNOSIS — E119 Type 2 diabetes mellitus without complications: Secondary | ICD-10-CM

## 2015-11-09 DIAGNOSIS — F17200 Nicotine dependence, unspecified, uncomplicated: Secondary | ICD-10-CM

## 2015-11-09 DIAGNOSIS — E785 Hyperlipidemia, unspecified: Secondary | ICD-10-CM

## 2015-11-09 DIAGNOSIS — I4821 Permanent atrial fibrillation: Secondary | ICD-10-CM

## 2015-11-09 DIAGNOSIS — I5032 Chronic diastolic (congestive) heart failure: Secondary | ICD-10-CM

## 2015-11-09 DIAGNOSIS — I10 Essential (primary) hypertension: Secondary | ICD-10-CM

## 2015-11-09 DIAGNOSIS — I4891 Unspecified atrial fibrillation: Secondary | ICD-10-CM | POA: Diagnosis not present

## 2015-11-09 MED ORDER — DOXAZOSIN MESYLATE 8 MG PO TABS: 8.0000 mg | ORAL_TABLET | Freq: Two times a day (BID) | ORAL | Status: DC

## 2015-11-09 NOTE — Assessment & Plan Note (Signed)
Leg edema likely multifactorial including venous insufficiency, exacerbation from amlodipine Unable to exclude small component of diastolic CHF

## 2015-11-09 NOTE — Assessment & Plan Note (Signed)
Cholesterol well controlled at 170 No medication needed

## 2015-11-09 NOTE — Assessment & Plan Note (Signed)
Recommended that he stay on his Lasix and metolazone for now with high-dose potassium If leg edema improved by holding amlodipine, may be able to wean the metolazone Suspect given his atrial fibrillation, obesity, sleep apnea, he will always need Lasix

## 2015-11-09 NOTE — Assessment & Plan Note (Signed)
We have encouraged continued exercise, careful diet management in an effort to lose weight. 

## 2015-11-09 NOTE — Assessment & Plan Note (Signed)
Reports that he stop smoking several weeks ago

## 2015-11-09 NOTE — Progress Notes (Signed)
Patient ID: Marco James, male    DOB: [birth year], 59 y.o.   MRN: [id number]  HPI Comments: Marco James is a 59 year old gentleman with morbid obesity, history of alcohol use, hypertension, diabetes type 2, chronic lower extremity swelling, chronic atrial fibrillation who follows up for his atrial fibrillation  In follow-up today, he reports that his blood pressure has been relatively well-controlled, continues to be bothered by lower extremity edema He has obstructive sleep apnea, uses CPAP, but has not seen a sleep physician in 12 years, no recent sleep study  Review of lab work shows hemoglobin A1c of 6, total cholesterol 170 Recent diabetes medication was held as numbers were doing well Denies any chest pain concerning for angina Tolerating warfarin though for Medicare has not checked this on regular basis.. Last INR was August 2016. Reports that he only checks this every 6 months or so He takes Lasix daily with metolazone, takes potassium 40 mEq daily  EKG on today's visit shows atrial fibrillation with ventricular rate 82 bpm, rare PVC  Other past medical history  taking Lasix 80 mg in the morning with metolazone 2.5 mg daily.    uncertain if he has had prior workup with previous cardiologist including echocardiogram. He denies having a stress test   No Known Allergies  Current Outpatient Prescriptions on File Prior to Visit  Medication Sig Dispense Refill  . albuterol (PROVENTIL HFA;VENTOLIN HFA) 108 (90 BASE) MCG/ACT inhaler Inhale 2 puffs into the lungs every 6 (six) hours as needed for wheezing or shortness of breath. 1 Inhaler 2  . amoxicillin (AMOXIL) 875 MG tablet Take 1 tablet (875 mg total) by mouth 2 (two) times daily. 20 tablet 0  . furosemide (LASIX) 80 MG tablet Take 1 tablet (80 mg total) by mouth daily. 90 tablet 3  . hydrocortisone (ANUSOL-HC) 25 MG suppository Place 1 suppository (25 mg total) rectally at bedtime. 12 suppository 5  . lisinopril  (PRINIVIL,ZESTRIL) 40 MG tablet Take 40 mg by mouth daily.    . metolazone (ZAROXOLYN) 2.5 MG tablet Take 1 tablet (2.5 mg total) by mouth daily as needed. 30 tablet 0  . metoprolol (LOPRESSOR) 100 MG tablet Take 1 tablet (100 mg total) by mouth 2 (two) times daily. 180 tablet 3  . naproxen (NAPROSYN) 500 MG tablet Take 1 tablet (500 mg total) by mouth 2 (two) times daily with a meal. 180 tablet 0  . NON FORMULARY CPAP    . potassium chloride SA (K-DUR,KLOR-CON) 20 MEQ tablet Take 1 tablet (20 mEq total) by mouth 2 (two) times daily. 60 tablet 6  . predniSONE (DELTASONE) 20 MG tablet Taper as follows: 3 pills for 4 days, two pills for 4 days, one pill for four days 24 tablet 0  . warfarin (COUMADIN) 6 MG tablet Take 1 tablet (6 mg total) by mouth daily. 30 tablet 6   No current facility-administered medications on file prior to visit.    Past Medical History  Diagnosis Date  . HTN (hypertension)   . Reactive airway disease   . Hyperlipidemia   . Type 2 diabetes mellitus (HCC)   . GERD (gastroesophageal reflux disease)   . Toenail fungus   . Polysubstance abuse   . A-fib (HCC)   . Arrhythmia   . Hyperuricemia   . Chronic airway obstruction (HCC)   . Obstructive sleep apnea   . CHF (congestive heart failure) (HCC)   . Cellulitis and abscess of leg 2009  . External hemorrhoids   .  Dupuytren's disease   . Shortness of breath dyspnea   . Pneumonia     Past Surgical History  Procedure Laterality Date  . Nasal sinus surgery    . Wisdom tooth extraction    . Colonoscopy with propofol N/A 10/25/2015    Procedure: COLONOSCOPY WITH PROPOFOL;  Surgeon: Midge Minium, MD;  Location: [facility] ENDOSCOPY;  Service: Endoscopy;  Laterality: N/A;    Social History  reports that he has quit smoking. His smoking use included Cigarettes. He has a 12.5 pack-year smoking history. He has never used smokeless tobacco. He reports that he drinks about 4.2 oz of alcohol per week. He reports that he does not  use illicit drugs.  Family History family history includes Heart disease (age of onset: 40) in his father.   Review of Systems  Constitutional: Negative.   Respiratory: Negative.   Cardiovascular: Negative.   Gastrointestinal: Negative.   Musculoskeletal: Negative.   Neurological: Negative.   Hematological: Negative.   Psychiatric/Behavioral: Negative.   All other systems reviewed and are negative.  BP 142/90 mmHg  Pulse 82  Ht 6' (1.829 m)  Wt 383 lb 4 oz (173.841 kg)  BMI 51.97 kg/m2  Physical Exam  Constitutional: He is oriented to person, place, and time. He appears well-developed and well-nourished.  HENT:  Head: Normocephalic.  Nose: Nose normal.  Mouth/Throat: Oropharynx is clear and moist.  Eyes: Conjunctivae are normal. Pupils are equal, round, and reactive to light.  Neck: Normal range of motion. Neck supple. No JVD present.  Cardiovascular: Normal rate, regular rhythm, S1 normal, S2 normal, normal heart sounds and intact distal pulses.  Exam reveals no gallop and no friction rub.   No murmur heard. Pulmonary/Chest: Effort normal and breath sounds normal. No respiratory distress. He has no wheezes. He has no rales. He exhibits no tenderness.  Abdominal: Soft. Bowel sounds are normal. He exhibits no distension. There is no tenderness.  Musculoskeletal: Normal range of motion. He exhibits no edema or tenderness.  Lymphadenopathy:    He has no cervical adenopathy.  Neurological: He is alert and oriented to person, place, and time. Coordination normal.  Skin: Skin is warm and dry. No rash noted. No erythema.  Psychiatric: He has a normal mood and affect. His behavior is normal. Judgment and thought content normal.      Assessment and Plan   Nursing note and vitals reviewed.

## 2015-11-09 NOTE — Assessment & Plan Note (Signed)
Recommended he hold his amlodipine, this is likely contributing to his chronic leg swelling Recommended that he increase his Cardura up to 8 mg twice a day If blood pressure runs high, could potentially add alternate medication such as isosorbide, hydralazine or clonidine

## 2015-11-09 NOTE — Patient Instructions (Signed)
You are doing well.  Please hold the amlodipine (can cause leg swelling)  Please increase the doxazosin up to 8 mg in the morning, and 4 mg in the evening If blood pressure runs high, Increase the doxazosin up to 8 mg twice a day  Check the price of eliquis 5 mg twice a day  Please call us if you have new issues that need to be addressed before your next appt.  Your physician wants you to follow-up in: 6 months.  You will receive a reminder letter in the mail two months in advance. If you don't receive a letter, please call our office to schedule the follow-up appointment.

## 2015-11-09 NOTE — Assessment & Plan Note (Signed)
Heart rate is well controlled, tolerating anticoagulation. Not a candidate for cardioversion

## 2015-12-20 ENCOUNTER — Telehealth: Payer: Self-pay | Admitting: Family Medicine

## 2015-12-20 NOTE — Telephone Encounter (Signed)
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  Pt states he has sinus and chest congestion.  Pt is also short of breath from coughing.  Pt states this has been going on for about a week.  Pt is request a Rx without coming in.  Pt request i sent back a note.   [facility].  CB#[phone]/MW

## 2015-12-20 NOTE — Telephone Encounter (Signed)
He will need an office visit to be evaluated

## 2015-12-20 NOTE — Telephone Encounter (Signed)
Please review chart and advise. KW 

## 2015-12-21 ENCOUNTER — Encounter: Payer: Self-pay | Admitting: Family Medicine

## 2015-12-21 ENCOUNTER — Ambulatory Visit (INDEPENDENT_AMBULATORY_CARE_PROVIDER_SITE_OTHER): Payer: BLUE CROSS/BLUE SHIELD | Admitting: Family Medicine

## 2015-12-21 VITALS — BP 162/98 | HR 94 | Temp 98.3°F | Resp 22

## 2015-12-21 DIAGNOSIS — J441 Chronic obstructive pulmonary disease with (acute) exacerbation: Secondary | ICD-10-CM | POA: Diagnosis not present

## 2015-12-21 MED ORDER — METHYLPREDNISOLONE ACETATE 80 MG/ML IJ SUSP
80.0000 mg | Freq: Once | INTRAMUSCULAR | Status: AC
  Administered 2015-12-21: 80 mg via INTRAMUSCULAR

## 2015-12-21 MED ORDER — CEFDINIR 300 MG PO CAPS: 300.0000 mg | ORAL_CAPSULE | Freq: Two times a day (BID) | ORAL | Status: DC

## 2015-12-21 MED ORDER — PREDNISONE 20 MG PO TABS: ORAL_TABLET | ORAL | Status: DC

## 2015-12-21 NOTE — Telephone Encounter (Signed)
Appointment scheduled for today/MW °

## 2015-12-21 NOTE — Progress Notes (Addendum)
Subjective:     Patient ID: Marco James, male   DOB: Jan 15, 1956, 59 y.o.   MRN: [id number]  HPI  Chief Complaint  Patient presents with  . Shortness of Breath    for a couple of days. He is coughing up thick,white sputm. Struggling to breath. Wheezing. Loss of appetite. No fever.   States this came on him suddenly two days ago. Has been using his albuterol frequently which gives him transient improvement.Reports sweats. States he has felt so bad he has not taken his medication for a couple of days.    Review of Systems  Constitutional: Negative for fever.       Objective:   Physical Exam  Constitutional: He appears well-developed and well-nourished. He appears distressed (having  trouble breathing but is able to speak without difficulty ).  Cardiovascular: An irregular rhythm present.  Pulmonary/Chest:  Bilateral short expiratory wheezes.  Oximetry improves to the 80's on 2 liters of oxygen. Remains at 80% off oxygen.     Assessment:    1. COPD exacerbation (HCC) - methylPREDNISolone acetate (DEPO-MEDROL) injection 80 mg; Inject 1 mL (80 mg total) into the muscle once. - predniSONE (DELTASONE) 20 MG tablet; Taper as follows: 3 pills for 4 days, two pills for 4 days, one pill for four days  Dispense: 24 tablet; Refill: 0 - cefdinir (OMNICEF) 300 MG capsule; Take 1 capsule (300 mg total) by mouth 2 (two) times daily.  Dispense: 20 capsule; Refill: 0 - Ambulatory referral to Pulmonology    Plan:    Report to ER if breathing not improving in 24 hours. Resume cardiac medication.

## 2015-12-21 NOTE — Patient Instructions (Signed)
Report to the emergency room if breathing not improving in the next 24 hours.

## 2015-12-30 ENCOUNTER — Ambulatory Visit
Admission: RE | Admit: 2015-12-30 | Discharge: 2015-12-30 | Disposition: A | Discharge status: Home / Self Care | Payer: BLUE CROSS/BLUE SHIELD | Source: Ambulatory Visit | Attending: Family Medicine | Admitting: Family Medicine

## 2015-12-30 ENCOUNTER — Encounter: Payer: Self-pay | Admitting: Family Medicine

## 2015-12-30 ENCOUNTER — Ambulatory Visit (INDEPENDENT_AMBULATORY_CARE_PROVIDER_SITE_OTHER): Payer: BLUE CROSS/BLUE SHIELD | Admitting: Family Medicine

## 2015-12-30 VITALS — BP 136/82 | HR 63 | Temp 97.6°F | Resp 18

## 2015-12-30 DIAGNOSIS — J441 Chronic obstructive pulmonary disease with (acute) exacerbation: Secondary | ICD-10-CM | POA: Insufficient documentation

## 2015-12-30 DIAGNOSIS — J4541 Moderate persistent asthma with (acute) exacerbation: Secondary | ICD-10-CM

## 2015-12-30 MED ORDER — IPRATROPIUM BROMIDE 0.02 % IN SOLN
0.5000 mg | Freq: Once | RESPIRATORY_TRACT | Status: AC
  Administered 2015-12-30: 0.5 mg via RESPIRATORY_TRACT

## 2015-12-30 MED ORDER — FLUTICASONE FUROATE-VILANTEROL 100-25 MCG/INH IN AEPB: 1.0000 | INHALATION_SPRAY | Freq: Every day | RESPIRATORY_TRACT | Status: DC

## 2015-12-30 NOTE — Progress Notes (Signed)
Patient ID: Marco James [facility], male   DOB: 1956-04-03, 60 y.o.   MRN: [id number]   Patient: Marco James [name]    DOB: 1956/12/05   60 y.o.   MRN: [id number] Visit Date: 12/30/2015  Today's Provider: Dortha Kern, PA   Chief Complaint  Patient presents with  . Bronchitis   Subjective:    Cough This is a recurrent problem. Associated symptoms include nasal congestion, shortness of breath and wheezing.  Was treated for acute exacerbation of COPD on 12-21-15 with DepoMedrol injection, Prednisone taper and Omnicef. Still some cough and sputum production with wheezing. Sweating a lot but no documented fever. Albuterol has helped with wheezing but fatigue easily. Denies GI upset. Had some reddish sputum this morning and wheezing is worse at night now.     Patient Active Problem List   Diagnosis Date Noted  . Special screening for malignant neoplasms, colon   . Diverticulosis of large intestine without diverticulitis   . Polysubstance abuse 06/13/2015  . Edema, peripheral 06/13/2015  . Extreme obesity (HCC) 06/13/2015  . Accelerated hypertension 06/13/2015  . Elevated blood uric acid level 06/13/2015  . Essential (primary) hypertension 06/13/2015  . Hypercholesteremia 06/13/2015  . History of prolonged Q-T interval on ECG 06/13/2015  . Fatigue 06/13/2015  . Benign paroxysmal positional nystagmus 06/13/2015  . Clinical depression 06/13/2015  . Arteriosclerosis of coronary artery 06/13/2015  . H/O hypokalemia 06/13/2015  . Atrial fibrillation, unspecified 10/25/2014  . Diastolic CHF, chronic (HCC) 10/25/2014  . Morbid obesity (HCC) 10/25/2014  . Bilateral lower extremity edema 10/25/2014  . Shortness of breath 10/25/2014  . Well controlled type 2 diabetes mellitus (HCC) 10/25/2014  . Apnea, sleep 10/06/2014  . A-fib (HCC) 10/06/2014  . Bronchitis 10/06/2014  . CCF (congestive cardiac failure) (HCC) 10/06/2014  . Diabetes mellitus, type 2 (HCC) 10/06/2014  . Dupuytren's disease  10/06/2014  . Edema leg 10/06/2014  . Acid reflux 10/06/2014  . BP (high blood pressure) 10/06/2014  . HLD (hyperlipidemia) 10/06/2014  . RAD (reactive airway disease) 10/06/2014  . Arthropathia 04/30/2010  . External hemorrhoids without complication 02/02/2009  . Dermatitis, purulent 12/16/2008  . Compulsive tobacco user syndrome 06/11/2007  . Diabetes (HCC) 12/24/2004  . Drug dependence, in remission (HCC) 12/24/2004  . Obstructive apnea 12/24/2001  . CAFL (chronic airflow limitation) (HCC) 08/11/1989   Past Surgical History  Procedure Laterality Date  . Nasal sinus surgery    . Wisdom tooth extraction    . Colonoscopy with propofol N/A 10/25/2015    Procedure: COLONOSCOPY WITH PROPOFOL;  Surgeon: Midge Minium, MD;  Location: [facility] ENDOSCOPY;  Service: Endoscopy;  Laterality: N/A;   Family History  Problem Relation Age of Onset  . Heart disease Father 70   No Known Allergies  Previous Medications   ALBUTEROL (PROVENTIL HFA;VENTOLIN HFA) 108 (90 BASE) MCG/ACT INHALER    Inhale 2 puffs into the lungs every 6 (six) hours as needed for wheezing or shortness of breath.   DOXAZOSIN (CARDURA) 8 MG TABLET    Take 1 tablet (8 mg total) by mouth 2 (two) times daily.   FUROSEMIDE (LASIX) 80 MG TABLET    Take 1 tablet (80 mg total) by mouth daily.   HYDROCORTISONE (ANUSOL-HC) 25 MG SUPPOSITORY    Place 1 suppository (25 mg total) rectally at bedtime.   LISINOPRIL (PRINIVIL,ZESTRIL) 40 MG TABLET    Take 40 mg by mouth daily.   METOLAZONE (ZAROXOLYN) 2.5 MG TABLET    Take 1 tablet (2.5 mg total) by mouth  daily as needed.   METOPROLOL (LOPRESSOR) 100 MG TABLET    Take 1 tablet (100 mg total) by mouth 2 (two) times daily.   NAPROXEN (NAPROSYN) 500 MG TABLET    Take 1 tablet (500 mg total) by mouth 2 (two) times daily with a meal.   NON FORMULARY    CPAP   POTASSIUM CHLORIDE SA (K-DUR,KLOR-CON) 20 MEQ TABLET    Take 1 tablet (20 mEq total) by mouth 2 (two) times daily.   WARFARIN (COUMADIN) 6 MG  TABLET    Take 1 tablet (6 mg total) by mouth daily.    Review of Systems  Constitutional: Negative.   HENT: Negative.   Eyes: Negative.   Respiratory: Positive for cough, shortness of breath and wheezing.   Cardiovascular: Negative.   Gastrointestinal: Negative.   Endocrine: Negative.   Genitourinary: Negative.   Musculoskeletal: Negative.   Skin: Negative.   Allergic/Immunologic: Negative.   Neurological: Negative.   Hematological: Negative.   Psychiatric/Behavioral: Negative.     Social History  Substance Use Topics  . Smoking status: Former Smoker -- 0.50 packs/day for 25 years    Types: Cigarettes  . Smokeless tobacco: Never Used  . Alcohol Use: 4.2 oz/week    5 Cans of beer, 2 Shots of liquor per week     Comment: OCCASIONALLY   Objective:   BP 136/82 mmHg  Pulse 63  Temp(Src) 97.6 F (36.4 C) (Oral)  Resp 18  SpO2 88%  Physical Exam  Constitutional: He is oriented to person, place, and time. He appears well-developed and well-nourished.  HENT:  Head: Normocephalic.  Right Ear: External ear normal.  Left Ear: External ear normal.  Nose: Nose normal.  Mouth/Throat: Oropharynx is clear and moist.  Eyes: Conjunctivae and EOM are normal.  Neck: Normal range of motion. Neck supple.  Cardiovascular: Normal rate and normal heart sounds.   Pulmonary/Chest: He has wheezes.  Distant breath sounds with some rhonchi.  Abdominal: Bowel sounds are normal.  Obese without masses.  Neurological: He is alert and oriented to person, place, and time.  Psychiatric: Judgment normal. He is agitated and aggressive. Cognition and memory are normal.      Assessment & Plan:     1. COPD with acute exacerbation (HCC) Sudden onset of dyspnea and cough with thick sputum production the day after Christmas. Was treated with Depo-medrol injection, Omnicef and Prednisone taper after he refused referral to the ER on 12-21-15 when pulse oximetry was 70% at presentation to the office.  Improved to 80% when given oxygen at 2 LPM and remained at 80% when discontinued. Has finished the Omicef and will finish the prednisone in 2 days. Will get CXR to rule out pneumonia since he has had some bloody sputum this morning. - DG Chest 2 View - Fluticasone Furoate-Vilanterol 100-25 MCG/INH AEPB; Inhale 1 puff into the lungs at bedtime.  Dispense: 1 each; Refill: 0  2. RAD (reactive airway disease), moderate persistent, with acute exacerbation Sweating and some dyspnea initially with pulse oximetry 88%. Given Duoneb by nebulizer in the office and pulse oximetry came up to 95%. Given Breo and may use Albuterol prn. Recommend he proceed with pulmonology referral as scheduled. - Fluticasone Furoate-Vilanterol 100-25 MCG/INH AEPB; Inhale 1 puff into the lungs at bedtime.  Dispense: 1 each; Refill: 0

## 2016-01-06 ENCOUNTER — Telehealth: Payer: Self-pay | Admitting: Family Medicine

## 2016-01-06 MED ORDER — CEFUROXIME AXETIL 500 MG PO TABS: 500.0000 mg | ORAL_TABLET | Freq: Two times a day (BID) | ORAL | Status: DC

## 2016-01-06 NOTE — Telephone Encounter (Signed)
Minimal improvement with Breo. Still having some cough, wheeze and sputum production. Finds welding fumes worsens symptoms and using fan during welding helps a little. Requests another antibiotic to try to clear all the bronchitis. Encouraged to refill the Albuterol and use it QID with Mucinex-DM BID. Will add Ceftin 500 mg BID for 10 days and recheck if needed.

## 2016-01-06 NOTE — Telephone Encounter (Signed)
Pt called back saying he is not any better.  He is still having a lot of the same symptoms as he was when he came in last.  He wants to know if Maurine MinisterDennis can call him in something else.    He uses [facility].  His call back is [phone]  Barth Kirkseri

## 2016-01-10 ENCOUNTER — Ambulatory Visit: Payer: Self-pay | Admitting: Family Medicine

## 2016-01-16 ENCOUNTER — Ambulatory Visit: Payer: Self-pay | Admitting: Family Medicine

## 2016-01-20 ENCOUNTER — Ambulatory Visit (INDEPENDENT_AMBULATORY_CARE_PROVIDER_SITE_OTHER): Payer: BLUE CROSS/BLUE SHIELD | Admitting: Pulmonary Disease

## 2016-01-20 ENCOUNTER — Encounter: Payer: Self-pay | Admitting: Pulmonary Disease

## 2016-01-20 VITALS — BP 148/84 | HR 110 | Ht 72.0 in | Wt 382.6 lb

## 2016-01-20 DIAGNOSIS — E669 Obesity, unspecified: Secondary | ICD-10-CM

## 2016-01-20 DIAGNOSIS — F17200 Nicotine dependence, unspecified, uncomplicated: Secondary | ICD-10-CM

## 2016-01-20 DIAGNOSIS — J410 Simple chronic bronchitis: Secondary | ICD-10-CM

## 2016-01-20 DIAGNOSIS — Z720 Tobacco use: Secondary | ICD-10-CM

## 2016-01-20 DIAGNOSIS — J4541 Moderate persistent asthma with (acute) exacerbation: Secondary | ICD-10-CM

## 2016-01-20 DIAGNOSIS — R0600 Dyspnea, unspecified: Secondary | ICD-10-CM

## 2016-01-20 MED ORDER — FLUTICASONE FUROATE-VILANTEROL 100-25 MCG/INH IN AEPB: 1.0000 | INHALATION_SPRAY | Freq: Every day | RESPIRATORY_TRACT | Status: AC

## 2016-01-20 MED ORDER — FLUTICASONE FUROATE-VILANTEROL 100-25 MCG/INH IN AEPB: 1.0000 | INHALATION_SPRAY | Freq: Every day | RESPIRATORY_TRACT | Status: DC

## 2016-01-22 NOTE — Progress Notes (Signed)
PULMONARY CONSULT NOTE  Requesting MD/Service: Dortha Kern, PA Date of initial consultation:  01/20/16 Reason for consultation: dyspnea  HPI:  60 y.o. M former smoker (quit 10/25/15) referred for evaluation of dyspnea, cough, sputum production. Symptoms were present from end of Nov through most of Dec. He was treated with prednisone and antibiotics. He states that he is now back to baseline which he describes as moderately limited by dyspnea with little day to day variation. He has an albuterol MDI whic he rarely uses. He last used it approx one week ago for wheezing and cough. He believes the albuterol helps when he uses it. Prior to early Nov, he smoked 1-2 ppd. He has had 4-5 episodes of "bronchitis" in past 10 yrs. He has never ben hospitalized for respiratory problems.   Past Medical History  Diagnosis Date  . HTN (hypertension)   . Reactive airway disease   . Hyperlipidemia   . Type 2 diabetes mellitus (HCC)   . GERD (gastroesophageal reflux disease)   . Toenail fungus   . Polysubstance abuse   . A-fib (HCC)   . Arrhythmia   . Hyperuricemia   . Chronic airway obstruction (HCC)   . Obstructive sleep apnea   . CHF (congestive heart failure) (HCC)   . Cellulitis and abscess of leg 2009  . External hemorrhoids   . Dupuytren's disease   . Shortness of breath dyspnea   . Pneumonia     Past Surgical History  Procedure Laterality Date  . Nasal sinus surgery    . Wisdom tooth extraction    . Colonoscopy with propofol N/A 10/25/2015    Procedure: COLONOSCOPY WITH PROPOFOL;  Surgeon: Midge Minium, MD;  Location: [facility] ENDOSCOPY;  Service: Endoscopy;  Laterality: N/A;    MEDICATIONS: I have reviewed all medications and confirmed regimen as documented  Social History   Social History  . Marital Status: Divorced    Spouse Name: N/A  . Number of Children: N/A  . Years of Education: N/A   Occupational History  . Not on file.   Social History Main Topics  . Smoking status:  Former Smoker -- quit 10/25/15. Up to 2 ppd previously    Types: Cigarettes  . Smokeless tobacco: Never Used  . Alcohol Use: 4.2 oz/week    5 Cans of beer, 2 Shots of liquor per week     Comment: OCCASIONALLY  . Drug Use: No     Comment: Pt. used cocaine for 3 years, marijuana  . Sexual Activity: Not on file   Other Topics Concern  . Not on file   Social History Narrative    Family History  Problem Relation Age of Onset  . Heart disease Father 17    ROS: No fever, myalgias/arthralgias, unexplained weight loss or weight gain No new focal weakness or sensory deficits No otalgia, hearing loss, visual changes, nasal and sinus symptoms, mouth and throat problems No neck pain or adenopathy No abdominal pain, N/V/D, diarrhea, change in bowel pattern No dysuria, change in urinary pattern No LE edema or calf tenderness   Filed Vitals:   01/20/16 1049  BP: 148/84  Pulse: 110  Height: 6' (1.829 m)  Weight: 382 lb 9.6 oz (173.546 kg)  SpO2: 92%     EXAM:  Gen: Obese, NAD HEENT: NCAT, TMs and canals normal, sclera white, nares and nasal mucosa normal, oropharynx normal Neck: Supple without LAN, thyromegaly, JVD not visualized Lungs: breath sounds mildly diminshed, percussion note normal throughout, No wheezes Cardiovascular: Reg  rhythm, rate normal, no murmurs noted Abdomen: Obese, soft, nontender, normal BS Ext: without clubbing, cyanosis, edema Neuro: CNs grossly intact, motor and sensory intact, DTRs symmetric Skin: Limited exam, no lesions noted  DATA:   BMP Latest Ref Rng 10/06/2015 08/24/2015 02/15/2015  Glucose 65 - 99 mg/dL 161(W) 960(A) -  BUN 6 - 24 mg/dL 19 54(U) 98(J)  Creatinine 0.76 - 1.27 mg/dL 1.91(Y) 7.82(N) 5.6(O)  BUN/Creat Ratio 9 - 20 14 23(H) -  Sodium 134 - 144 mmol/L 140 140 139  Potassium 3.5 - 5.2 mmol/L 3.4(L) 2.6(L) 3.5  Chloride 97 - 108 mmol/L 91(L) 89(L) -  CO2 18 - 29 mmol/L 32(H) 30(H) -  Calcium 8.7 - 10.2 mg/dL 8.9 8.8 -    CBC  Latest Ref Rng 10/06/2015 08/24/2015 02/15/2015  WBC 3.4 - 10.8 x10E3/uL 9.7 10.0 8.0  Hemoglobin 13.5 - 17.5 g/dL - - 13.0  Hematocrit 86.5 - 51.0 % 45.7 47.2 43  Platelets 150 - 379 x10E3/uL 161 142(L) 150    CXR (12/30/15): NACPD    IMPRESSION:     ICD-9-CM ICD-10-CM   1. Dyspnea 786.09 R06.00 Pulmonary function test  2. Obesity 278.00 E66.9   3. Smoker 305.1 Z72.0 Pulmonary function test  4. Simple chronic bronchitis (HCC) 491.0 J41.0 Pulmonary function test     PLAN:  Trial of Breo 100/25 Cont PRN albuterol ROV 3-4 wks with PFTs   Merwyn Katos, MD [facility] [place] [facility], Critical Care Medicine

## 2016-01-23 ENCOUNTER — Other Ambulatory Visit: Payer: Self-pay

## 2016-01-23 NOTE — Telephone Encounter (Signed)
Refill request received from [facility] [facility] requesting Naproxen 500 mg.  

## 2016-01-24 ENCOUNTER — Other Ambulatory Visit: Payer: Self-pay | Admitting: Family Medicine

## 2016-01-24 MED ORDER — NAPROXEN 500 MG PO TABS: 500.0000 mg | ORAL_TABLET | Freq: Two times a day (BID) | ORAL | Status: DC

## 2016-01-24 NOTE — Telephone Encounter (Signed)
Patient advised as directed below. Patient verbalized understanding.  

## 2016-02-15 ENCOUNTER — Telehealth: Payer: Self-pay

## 2016-02-15 DIAGNOSIS — Z8639 Personal history of other endocrine, nutritional and metabolic disease: Secondary | ICD-10-CM

## 2016-02-15 NOTE — Telephone Encounter (Addendum)
Refill request received from [facility] requesting Potassium ER 20 MEQ and Warfarin 6 mg.

## 2016-02-16 MED ORDER — POTASSIUM CHLORIDE CRYS ER 20 MEQ PO TBCR: 20.0000 meq | EXTENDED_RELEASE_TABLET | Freq: Two times a day (BID) | ORAL | Status: DC

## 2016-02-16 MED ORDER — WARFARIN SODIUM 6 MG PO TABS: 6.0000 mg | ORAL_TABLET | Freq: Every day | ORAL | Status: DC

## 2016-02-21 ENCOUNTER — Ambulatory Visit: Payer: BLUE CROSS/BLUE SHIELD | Admitting: Pulmonary Disease

## 2016-02-21 ENCOUNTER — Other Ambulatory Visit: Payer: Self-pay

## 2016-02-21 NOTE — Telephone Encounter (Signed)
Refill request received from [facility] requesting Metolazone 2.5 mg.

## 2016-02-23 MED ORDER — METOLAZONE 2.5 MG PO TABS: 2.5000 mg | ORAL_TABLET | Freq: Every day | ORAL | Status: DC | PRN

## 2016-04-05 ENCOUNTER — Other Ambulatory Visit: Payer: Self-pay

## 2016-04-05 DIAGNOSIS — I10 Essential (primary) hypertension: Secondary | ICD-10-CM

## 2016-04-05 MED ORDER — METOPROLOL TARTRATE 100 MG PO TABS: 100.0000 mg | ORAL_TABLET | Freq: Two times a day (BID) | ORAL | Status: DC

## 2016-04-05 NOTE — Telephone Encounter (Signed)
Refill request received from [facility] requesting Metoprolol 100 mg.

## 2016-05-24 ENCOUNTER — Other Ambulatory Visit: Payer: Self-pay | Admitting: Family Medicine

## 2016-05-24 DIAGNOSIS — J449 Chronic obstructive pulmonary disease, unspecified: Secondary | ICD-10-CM

## 2016-05-24 MED ORDER — ALBUTEROL SULFATE HFA 108 (90 BASE) MCG/ACT IN AERS: 2.0000 | INHALATION_SPRAY | Freq: Four times a day (QID) | RESPIRATORY_TRACT | Status: DC | PRN

## 2016-05-29 ENCOUNTER — Encounter: Payer: Self-pay | Admitting: Family Medicine

## 2016-05-29 ENCOUNTER — Ambulatory Visit (INDEPENDENT_AMBULATORY_CARE_PROVIDER_SITE_OTHER): Payer: BLUE CROSS/BLUE SHIELD | Admitting: Family Medicine

## 2016-05-29 VITALS — Temp 98.6°F | Resp 20 | Wt 398.0 lb

## 2016-05-29 DIAGNOSIS — E1140 Type 2 diabetes mellitus with diabetic neuropathy, unspecified: Secondary | ICD-10-CM | POA: Diagnosis not present

## 2016-05-29 DIAGNOSIS — I5032 Chronic diastolic (congestive) heart failure: Secondary | ICD-10-CM

## 2016-05-29 DIAGNOSIS — J0100 Acute maxillary sinusitis, unspecified: Secondary | ICD-10-CM

## 2016-05-29 DIAGNOSIS — J4541 Moderate persistent asthma with (acute) exacerbation: Secondary | ICD-10-CM | POA: Diagnosis not present

## 2016-05-29 LAB — POCT GLYCOSYLATED HEMOGLOBIN (HGB A1C)
Est. average glucose Bld gHb Est-mCnc: 174
Hemoglobin A1C: 7.7

## 2016-05-29 MED ORDER — PREDNISONE 10 MG PO TABS: ORAL_TABLET | ORAL | Status: DC

## 2016-05-29 MED ORDER — GLIPIZIDE 5 MG PO TABS: 2.5000 mg | ORAL_TABLET | Freq: Two times a day (BID) | ORAL | Status: DC

## 2016-05-29 MED ORDER — AMOXICILLIN 875 MG PO TABS: 875.0000 mg | ORAL_TABLET | Freq: Two times a day (BID) | ORAL | Status: DC

## 2016-05-29 NOTE — Progress Notes (Signed)
[facility]   DOB: 1956/11/09, 60 y.o.   MRN: [id number]       [facility]    DOB: 1956/11/09   60 y.o.   MRN: [id number] Visit Date: 05/29/2016  Today's Provider: Dortha Kernennis Chrismon, PA   Chief Complaint  Patient presents with  . Diabetes   Subjective:    Diabetes He presents for his follow-up diabetic visit. He has type 2 diabetes mellitus. His disease course has been worsening. Hypoglycemia symptoms include dizziness and sleepiness. Associated symptoms include fatigue and weakness. Pertinent negatives for diabetes include no blurred vision, no polyphagia and no polyuria. Symptoms are worsening. When asked about current treatments, none were reported. His weight is stable. He never participates in exercise. He does not see a podiatrist.Eye exam is not current.  URI  The current episode started in the past 7 days. The problem has been gradually improving. There has been no fever. Associated symptoms include congestion, coughing, rhinorrhea, sneezing and wheezing. Associated symptoms comments: Lightheaded or dizzy.Marland Kitchen. He has tried nothing for the symptoms.   Patient reports that his average FBS have been around 170. He reports that he has been feeling really fatigued and lightheaded occasionally.    Past Medical History  Diagnosis Date  . HTN (hypertension)   . Reactive airway disease   . Hyperlipidemia   . Type 2 diabetes mellitus (HCC)   . GERD (gastroesophageal reflux disease)   . Toenail fungus   . Polysubstance abuse   . A-fib (HCC)   . Arrhythmia   . Hyperuricemia   . Chronic airway obstruction (HCC)   . Obstructive sleep apnea   . CHF (congestive heart failure) (HCC)   . Cellulitis and abscess of leg 2009  . External hemorrhoids   . Dupuytren's disease   . Shortness of breath dyspnea   . Pneumonia    Past Surgical History  Procedure Laterality Date  . Nasal sinus surgery    . Wisdom tooth extraction    . Colonoscopy with propofol  N/A 10/25/2015    Procedure: COLONOSCOPY WITH PROPOFOL;  Surgeon: Midge Miniumarren Wohl, MD;  Location: [facility] ENDOSCOPY;  Service: Endoscopy;  Laterality: N/A;   Family History  Problem Relation Age of Onset  . Heart disease Father 7277    No Known Allergies   Previous Medications   ALBUTEROL (PROVENTIL HFA;VENTOLIN HFA) 108 (90 BASE) MCG/ACT INHALER    Inhale 2 puffs into the lungs every 6 (six) hours as needed for wheezing or shortness of breath.   CEFUROXIME (CEFTIN) 500 MG TABLET    Take 1 tablet (500 mg total) by mouth 2 (two) times daily with a meal.   DOXAZOSIN (CARDURA) 8 MG TABLET    Take 1 tablet (8 mg total) by mouth 2 (two) times daily.   FLUTICASONE FUROATE-VILANTEROL 100-25 MCG/INH AEPB    Inhale 1 puff into the lungs at bedtime.   FLUTICASONE FUROATE-VILANTEROL 100-25 MCG/INH AEPB    Inhale 1 puff into the lungs daily.   FUROSEMIDE (LASIX) 80 MG TABLET    Take 1 tablet (80 mg total) by mouth daily.   HYDROCORTISONE (ANUSOL-HC) 25 MG SUPPOSITORY    Place 1 suppository (25 mg total) rectally at bedtime.   LISINOPRIL (PRINIVIL,ZESTRIL) 40 MG TABLET    Take 40 mg by mouth daily.   METOLAZONE (ZAROXOLYN) 2.5 MG TABLET    Take 1 tablet (2.5 mg total) by mouth daily as needed.   METOPROLOL (LOPRESSOR) 100 MG TABLET  Take 1 tablet (100 mg total) by mouth 2 (two) times daily.   NAPROXEN (NAPROSYN) 500 MG TABLET    Take 1 tablet (500 mg total) by mouth 2 (two) times daily with a meal.   NON FORMULARY    CPAP   POTASSIUM CHLORIDE SA (K-DUR,KLOR-CON) 20 MEQ TABLET    Take 1 tablet (20 mEq total) by mouth 2 (two) times daily.   WARFARIN (COUMADIN) 6 MG TABLET    Take 1 tablet (6 mg total) by mouth daily.    Review of Systems  Constitutional: Positive for fatigue.  HENT: Positive for congestion, rhinorrhea and sneezing.   Eyes: Negative for blurred vision.  Respiratory: Positive for cough and wheezing.   Endocrine: Negative for polyphagia and polyuria.  Neurological: Positive for dizziness and  weakness.    Social History  Substance Use Topics  . Smoking status: Former Smoker -- 0.50 packs/day for 25 years    Types: Cigarettes  . Smokeless tobacco: Never Used  . Alcohol Use: 4.2 oz/week    5 Cans of beer, 2 Shots of liquor per week     Comment: OCCASIONALLY   Objective:   Temp(Src) 98.6 F (37 C)  Resp 20  Wt 398 lb (180.532 kg) Wt Readings from Last 3 Encounters:  05/29/16 398 lb (180.532 kg)  01/20/16 382 lb 9.6 oz (173.546 kg)  11/09/15 383 lb 4 oz (173.841 kg)   Physical Exam  Constitutional: He is oriented to person, place, and time. He appears well-developed and well-nourished. No distress.  HENT:  Head: Normocephalic and atraumatic.  Right Ear: Hearing normal.  Left Ear: Hearing normal.  Nose: Nose normal.  No transillumination of maxillary sinuses. No tenderness.  Eyes: Conjunctivae and lids are normal. Right eye exhibits no discharge. Left eye exhibits no discharge. No scleral icterus.  Neck: Neck supple.  Cardiovascular: Normal rate and regular rhythm.   Pulmonary/Chest: Effort normal. He has wheezes.  Abdominal: Soft. Bowel sounds are normal.  Morbid obesity.  Musculoskeletal: Normal range of motion. He exhibits edema.  No pitting today.  Lymphadenopathy:    He has no cervical adenopathy.  Neurological: He is alert and oriented to person, place, and time.  Skin: Skin is intact. No lesion and no rash noted.  Psychiatric: He has a normal mood and affect. His speech is normal and behavior is normal. Thought content normal.      Assessment & Plan:     1. Type 2 diabetes mellitus with diabetic neuropathy, unspecified long term insulin use status (HCC) Hgb A1C 7.7 with estimated glucose average of 174 today. Admits he has been off the Glipizide and want to get back on it. Encouraged to work on diet and weight loss (limited ability to exercise with severe obesity and CHF with atrial fibrillation. Refill Glipizide and get routine follow up labs. - POCT  glycosylated hemoglobin (Hb A1C) - CBC with Differential/Platelet - Comprehensive metabolic panel - glipiZIDE (GLUCOTROL) 5 MG tablet; Take 0.5 tablets (2.5 mg total) by mouth 2 (two) times daily before a meal.  Dispense: 60 tablet; Refill: 3  2. Acute maxillary sinusitis, recurrence not specified Onset over the past week. Having some lightheaded sensation and congestion with cough and wheeze intermittently. Recommend Mucinex-DM and add antibiotic. Will check CBC with diff. - CBC with Differential/Platelet - amoxicillin (AMOXIL) 875 MG tablet; Take 1 tablet (875 mg total) by mouth 2 (two) times daily.  Dispense: 20 tablet; Refill: 0  3. RAD (reactive airway disease), moderate persistent, with acute exacerbation  Onset with sinus congestion. No fever or purulent sputum production. Encouraged to use Albuterol inhaler prn, Breo ("don't feel it helps") and add Prednisone taper ("this usually works the best") for bronchitis flare. If no improvement in the next 5 days, may need referral to pulmonologist again (Dr. Sung Amabile) or return for nebulizer treatment. States congestion and cough started after purchasing a new lawnmower and using it. - predniSONE (DELTASONE) 10 MG tablet; Taper down by one tablet daily as directed (6,5,4,3,2,1)  Dispense: 21 tablet; Refill: 0  4. Diastolic CHF, chronic (HCC) Has gained 16 lbs since 01-20-16. Encouraged to use the Lasix and Zaroxolyn daily with KCL as recommended by his cardiologist. Still taking Coumadin 6 mg qd with refusal to recheck protime regularly. Continues to use Metoprolol for atrial fibrillation. Has felt better since using the Cardura 8 mg BID but BP low and fatigue easily. Will recheck labs. May need to lower Cardura or Lisinopril since BP 100/70-60 today and at home. May need referral back to his cardiologist (Dr. Mariah Milling). - TSH       Dortha Kern, PA  [facility]

## 2016-05-30 LAB — COMPREHENSIVE METABOLIC PANEL
ALBUMIN: 4.2 g/dL (ref 3.5–5.5)
ALT: 17 IU/L (ref 0–44)
AST: 20 IU/L (ref 0–40)
Albumin/Globulin Ratio: 1.8 (ref 1.2–2.2)
Alkaline Phosphatase: 56 IU/L (ref 39–117)
BUN / CREAT RATIO: 19 (ref 9–20)
BUN: 31 mg/dL — ABNORMAL HIGH (ref 6–24)
Bilirubin Total: 0.7 mg/dL (ref 0.0–1.2)
CALCIUM: 9.6 mg/dL (ref 8.7–10.2)
CO2: 29 mmol/L (ref 18–29)
CREATININE: 1.64 mg/dL — AB (ref 0.76–1.27)
Chloride: 90 mmol/L — ABNORMAL LOW (ref 96–106)
GFR calc Af Amer: 53 mL/min/{1.73_m2} — ABNORMAL LOW (ref >59)
GFR, EST NON AFRICAN AMERICAN: 45 mL/min/{1.73_m2} — AB (ref >59)
GLOBULIN, TOTAL: 2.4 g/dL (ref 1.5–4.5)
Glucose: 241 mg/dL — ABNORMAL HIGH (ref 65–99)
Potassium: 3.4 mmol/L — ABNORMAL LOW (ref 3.5–5.2)
SODIUM: 139 mmol/L (ref 134–144)
TOTAL PROTEIN: 6.6 g/dL (ref 6.0–8.5)

## 2016-05-30 LAB — CBC WITH DIFFERENTIAL/PLATELET
BASOS: 0 %
Basophils Absolute: 0 10*3/uL (ref 0.0–0.2)
EOS (ABSOLUTE): 0.2 10*3/uL (ref 0.0–0.4)
EOS: 3 %
HEMATOCRIT: 43.7 % (ref 37.5–51.0)
Hemoglobin: 15.2 g/dL (ref 12.6–17.7)
IMMATURE GRANS (ABS): 0 10*3/uL (ref 0.0–0.1)
IMMATURE GRANULOCYTES: 0 %
LYMPHS: 16 %
Lymphocytes Absolute: 1.1 10*3/uL (ref 0.7–3.1)
MCH: 30.9 pg (ref 26.6–33.0)
MCHC: 34.8 g/dL (ref 31.5–35.7)
MCV: 89 fL (ref 79–97)
MONOS ABS: 0.3 10*3/uL (ref 0.1–0.9)
Monocytes: 4 %
NEUTROS PCT: 77 %
Neutrophils Absolute: 5 10*3/uL (ref 1.4–7.0)
PLATELETS: 124 10*3/uL — AB (ref 150–379)
RBC: 4.92 x10E6/uL (ref 4.14–5.80)
RDW: 13.9 % (ref 12.3–15.4)
WBC: 6.5 10*3/uL (ref 3.4–10.8)

## 2016-05-30 LAB — TSH: TSH: 2.29 u[IU]/mL (ref 0.450–4.500)

## 2016-05-31 ENCOUNTER — Telehealth: Payer: Self-pay

## 2016-05-31 DIAGNOSIS — E1140 Type 2 diabetes mellitus with diabetic neuropathy, unspecified: Secondary | ICD-10-CM

## 2016-05-31 DIAGNOSIS — I5032 Chronic diastolic (congestive) heart failure: Secondary | ICD-10-CM

## 2016-05-31 MED ORDER — GLIPIZIDE 5 MG PO TABS: 2.5000 mg | ORAL_TABLET | Freq: Two times a day (BID) | ORAL | Status: DC

## 2016-05-31 MED ORDER — LISINOPRIL 40 MG PO TABS: 40.0000 mg | ORAL_TABLET | Freq: Every day | ORAL | Status: DC

## 2016-05-31 NOTE — Telephone Encounter (Signed)
Left message to call back  

## 2016-05-31 NOTE — Telephone Encounter (Signed)
Refilled Glipizide and Lisinopril for 90 day supplies. Would like to help BP come up a little and don't want kidney function to start failing. If only taking one 8 mg Cardura a day, try breaking it in half to take 4 mg BID. If BP 100/70 (or lower) consistently, should see Dr. Mariah MillingGollan again.

## 2016-05-31 NOTE — Telephone Encounter (Signed)
Advised patient as below. Patient reports that he will call and schedule appt in 1 month.

## 2016-05-31 NOTE — Telephone Encounter (Signed)
Pt is returning call.  CB#[phone]/MW

## 2016-05-31 NOTE — Telephone Encounter (Signed)
Advised patient as below. Patient admits that he doesn't take the evening dose of Cardura anyway. Do you still want him to take at least 1/2 tablet? Also, patient is requesting refill on glipizide and lisinopril for 90 day supply. Please advise. Thanks!

## 2016-05-31 NOTE — Telephone Encounter (Signed)
-----   Message from Tamsen Roersennis E Chrismon, [place] sent at 05/31/2016  8:58 AM EDT ----- Platelets a little low again. No signs of anemia. Blood sugar very high. Definitely need to get back on Glipizide daily. Potassium still a little low and kidney function worsening again. Recommend decreasing Cardura to 1 tablet in the morning and 1/2 tablet in the evenings. Recheck blood levels in 1 month and check BP. If no better at that time, may need to recheck with Dr. Mariah MillingGollan or a nephrologist.

## 2016-06-29 ENCOUNTER — Other Ambulatory Visit: Payer: Self-pay | Admitting: Family Medicine

## 2016-07-02 ENCOUNTER — Ambulatory Visit (INDEPENDENT_AMBULATORY_CARE_PROVIDER_SITE_OTHER): Payer: BLUE CROSS/BLUE SHIELD | Admitting: Family Medicine

## 2016-07-02 ENCOUNTER — Encounter: Payer: Self-pay | Admitting: Family Medicine

## 2016-07-02 VITALS — BP 128/72 | HR 66 | Temp 98.5°F | Resp 24

## 2016-07-02 DIAGNOSIS — R609 Edema, unspecified: Secondary | ICD-10-CM | POA: Diagnosis not present

## 2016-07-02 DIAGNOSIS — E1140 Type 2 diabetes mellitus with diabetic neuropathy, unspecified: Secondary | ICD-10-CM | POA: Diagnosis not present

## 2016-07-02 DIAGNOSIS — Z8639 Personal history of other endocrine, nutritional and metabolic disease: Secondary | ICD-10-CM

## 2016-07-02 MED ORDER — GLIPIZIDE 10 MG PO TABS: 10.0000 mg | ORAL_TABLET | Freq: Two times a day (BID) | ORAL | Status: DC

## 2016-07-02 NOTE — Progress Notes (Signed)
Patient: Marco James [facility] Male    DOB: 02/09/1956   60 y.o.   MRN: [id number] Visit Date: 07/02/2016  Today's Provider: Dortha Kern, PA   Chief Complaint  Patient presents with  . Dizziness    x 3 days    Subjective:    HPI Patient comes in today c/o dizziness X 3 days. He reports that his symptoms have improved, but he still has dizzy spells. Felt better and dizziness stopped when he drank extra water and had some protein snacks. No low blood sugars at home (lowest was 125 - usually in the 150-170 range). Not having significant nocturia. Uses diuretics in the morning for swelling of legs and feet. Takes KCL 20 meq qd because K+ was down to 3.4 on 05-31-16.   Past Medical History  Diagnosis Date  . HTN (hypertension)   . Reactive airway disease   . Hyperlipidemia   . Type 2 diabetes mellitus (HCC)   . GERD (gastroesophageal reflux disease)   . Toenail fungus   . Polysubstance abuse   . A-fib (HCC)   . Arrhythmia   . Hyperuricemia   . Chronic airway obstruction (HCC)   . Obstructive sleep apnea   . CHF (congestive heart failure) (HCC)   . Cellulitis and abscess of leg 2009  . External hemorrhoids   . Dupuytren's disease   . Shortness of breath dyspnea   . Pneumonia    Past Surgical History  Procedure Laterality Date  . Nasal sinus surgery    . Wisdom tooth extraction    . Colonoscopy with propofol N/A 10/25/2015    Procedure: COLONOSCOPY WITH PROPOFOL;  Surgeon: Midge Minium, MD;  Location: [facility] ENDOSCOPY;  Service: Endoscopy;  Laterality: N/A;   Family History  Problem Relation Age of Onset  . Heart disease Father 36   No Known Allergies Current Outpatient Prescriptions on File Prior to Visit  Medication Sig Dispense Refill  . albuterol (PROVENTIL HFA;VENTOLIN HFA) 108 (90 Base) MCG/ACT inhaler Inhale 2 puffs into the lungs every 6 (six) hours as needed for wheezing or shortness of breath. 1 Inhaler 2  . doxazosin (CARDURA) 8 MG tablet Take 1 tablet (8 mg  total) by mouth 2 (two) times daily. 180 tablet 3  . Fluticasone Furoate-Vilanterol 100-25 MCG/INH AEPB Inhale 1 puff into the lungs at bedtime. 1 each 11  . Fluticasone Furoate-Vilanterol 100-25 MCG/INH AEPB Inhale 1 puff into the lungs daily. 60 each 5  . furosemide (LASIX) 80 MG tablet Take 1 tablet (80 mg total) by mouth daily. 90 tablet 3  . glipiZIDE (GLUCOTROL) 5 MG tablet Take 0.5 tablets (2.5 mg total) by mouth 2 (two) times daily before a meal. 90 tablet 3  . hydrocortisone (ANUSOL-HC) 25 MG suppository Place 1 suppository (25 mg total) rectally at bedtime. (Patient not taking: Reported on 05/29/2016) 12 suppository 5  . lisinopril (PRINIVIL,ZESTRIL) 40 MG tablet Take 1 tablet (40 mg total) by mouth daily. 90 tablet 3  . metolazone (ZAROXOLYN) 2.5 MG tablet TAKE 1 TABLET BY MOUTH DAILY AS NEEDED 30 tablet 3  . metoprolol (LOPRESSOR) 100 MG tablet Take 1 tablet (100 mg total) by mouth 2 (two) times daily. 180 tablet 3  . naproxen (NAPROSYN) 500 MG tablet Take 1 tablet (500 mg total) by mouth 2 (two) times daily with a meal. 180 tablet 1  . NON FORMULARY CPAP    . potassium chloride SA (K-DUR,KLOR-CON) 20 MEQ tablet Take 1 tablet (20 mEq total) by  mouth 2 (two) times daily. 60 tablet 6  . predniSONE (DELTASONE) 10 MG tablet Taper down by one tablet daily as directed (6,5,4,3,2,1) 21 tablet 0  . warfarin (COUMADIN) 6 MG tablet Take 1 tablet (6 mg total) by mouth daily. 30 tablet 6   No current facility-administered medications on file prior to visit.   Review of Systems  Constitutional: Negative.   Respiratory: Positive for shortness of breath. Negative for cough, choking, chest tightness, wheezing and stridor.   Cardiovascular: Negative for chest pain and leg swelling.  Genitourinary: Negative.   Neurological: Positive for dizziness and headaches.       Tingling and itchy sensation in both feet and between toes.   Social History  Substance Use Topics  . Smoking status: Former Smoker  -- 0.50 packs/day for 25 years    Types: Cigarettes  . Smokeless tobacco: Never Used  . Alcohol Use: 4.2 oz/week    5 Cans of beer, 2 Shots of liquor per week     Comment: OCCASIONALLY   Objective:   BP 128/72 mmHg  Pulse 66  Temp(Src) 98.5 F (36.9 C)  Resp 24  Wt   SpO2 96%  Physical Exam  Constitutional: He is oriented to person, place, and time. He appears well-developed and well-nourished. No distress.  HENT:  Head: Normocephalic and atraumatic.  Right Ear: Hearing normal.  Left Ear: Hearing normal.  Nose: Nose normal.  Eyes: Conjunctivae and lids are normal. Right eye exhibits no discharge. Left eye exhibits no discharge. No scleral icterus.  Pulmonary/Chest: Effort normal. No respiratory distress.  Musculoskeletal: Normal range of motion. He exhibits edema.  3-4+ both lower legs with fish scale appearance with tan to red coloration above ankles. Some pitting.  Neurological: He is alert and oriented to person, place, and time.  Skin: Skin is intact. No lesion and no rash noted.  Psychiatric: He has a normal mood and affect. His speech is normal and behavior is normal. Thought content normal.      Assessment & Plan:     1. H/O dehydration Had some dizziness after stopping a motorcycle trip most of the day Friday 06-29-16. Only occurred as a light headed sensation with muscle cramps. Greatly improved after eating a protein snack and drinking extra fluids. Remembers not drinking enough that day. Denies any hypoglycemic episodes. Encouraged to drink plenty of water despite use of diuretics for edema and CHF with A. Fib. Labs on 05-31-16 showed potassium 3.4. Declines blood tests today. Feeling greatly improved without recurrence of symptoms. Recheck in 3 months   2. Edema, peripheral Continues to use Lasix 80 mg with KCL 20 meq daily for persistent dependent edema both lower legs and history of CHF per Dr. Mariah MillingGollan. No dyspnea today. Continue follow up with cardiologist.  3. Type  2 diabetes mellitus with diabetic neuropathy, unspecified long term insulin use status (HCC) RBS 160 now. Has averaged 125 - 170 at home. Has increased his Glipizide to 10 mg BID at home without hypoglycemic episodes. No polyuria or polydipsia. Will change prescription and recheck in 3 months. - glipiZIDE (GLUCOTROL) 10 MG tablet; Take 1 tablet (10 mg total) by mouth 2 (two) times daily before a meal.  Dispense: 60 tablet; Refill: 3        Dortha Kernennis Chrismon, PA  [facility]

## 2016-07-02 NOTE — Patient Instructions (Signed)

## 2016-07-23 ENCOUNTER — Encounter: Payer: Self-pay | Admitting: Family Medicine

## 2016-07-23 ENCOUNTER — Ambulatory Visit (INDEPENDENT_AMBULATORY_CARE_PROVIDER_SITE_OTHER): Payer: BLUE CROSS/BLUE SHIELD | Admitting: Family Medicine

## 2016-07-23 VITALS — BP 144/64 | HR 72 | Temp 98.2°F | Resp 18

## 2016-07-23 DIAGNOSIS — M7021 Olecranon bursitis, right elbow: Secondary | ICD-10-CM

## 2016-07-23 DIAGNOSIS — M79644 Pain in right finger(s): Secondary | ICD-10-CM | POA: Diagnosis not present

## 2016-07-23 DIAGNOSIS — M720 Palmar fascial fibromatosis [Dupuytren]: Secondary | ICD-10-CM

## 2016-07-23 DIAGNOSIS — M79671 Pain in right foot: Secondary | ICD-10-CM

## 2016-07-23 MED ORDER — PREDNISONE 10 MG PO TABS: ORAL_TABLET | ORAL | 0 refills | Status: DC

## 2016-07-23 NOTE — Progress Notes (Signed)
Patient: Marco [facility] Male    DOB: 06/15/1956   60 y.o.   MRN: [id number] Visit Date: 07/23/2016  Today's Provider: Dortha Kern, PA   Chief Complaint  Patient presents with  . Finger Injury  . Foot Pain   Subjective:    HPI  Patient has pain in left foot, left elbow and finger of left hand for last 2 days.Some swelling has occurred. No injury mechanism.     No Known Allergies Current Meds  Medication Sig  . albuterol (PROVENTIL HFA;VENTOLIN HFA) 108 (90 Base) MCG/ACT inhaler Inhale 2 puffs into the lungs every 6 (six) hours as needed for wheezing or shortness of breath.  . doxazosin (CARDURA) 8 MG tablet Take 1 tablet (8 mg total) by mouth 2 (two) times daily.  . Fluticasone Furoate-Vilanterol 100-25 MCG/INH AEPB Inhale 1 puff into the lungs at bedtime.  . Fluticasone Furoate-Vilanterol 100-25 MCG/INH AEPB Inhale 1 puff into the lungs daily.  . furosemide (LASIX) 80 MG tablet Take 1 tablet (80 mg total) by mouth daily.  Marland Kitchen glipiZIDE (GLUCOTROL) 10 MG tablet Take 1 tablet (10 mg total) by mouth 2 (two) times daily before a meal.  . hydrocortisone (ANUSOL-HC) 25 MG suppository Place 1 suppository (25 mg total) rectally at bedtime.  Marland Kitchen lisinopril (PRINIVIL,ZESTRIL) 40 MG tablet Take 1 tablet (40 mg total) by mouth daily.  . metolazone (ZAROXOLYN) 2.5 MG tablet TAKE 1 TABLET BY MOUTH DAILY AS NEEDED  . metoprolol (LOPRESSOR) 100 MG tablet Take 1 tablet (100 mg total) by mouth 2 (two) times daily.  . naproxen (NAPROSYN) 500 MG tablet Take 1 tablet (500 mg total) by mouth 2 (two) times daily with a meal.  . NON FORMULARY CPAP  . potassium chloride SA (K-DUR,KLOR-CON) 20 MEQ tablet Take 1 tablet (20 mEq total) by mouth 2 (two) times daily.  Marland Kitchen warfarin (COUMADIN) 6 MG tablet Take 1 tablet (6 mg total) by mouth daily.    Review of Systems  Constitutional: Negative for appetite change, chills and fever.  Respiratory: Negative for chest tightness, shortness of breath and  wheezing.   Cardiovascular: Negative for chest pain and palpitations.  Gastrointestinal: Negative for abdominal pain, nausea and vomiting.  Musculoskeletal: Positive for arthralgias, gait problem, joint swelling and myalgias.    Social History  Substance Use Topics  . Smoking status: Former Smoker    Packs/day: 0.50    Years: 25.00    Types: Cigarettes  . Smokeless tobacco: Never Used  . Alcohol use 4.2 oz/week    5 Cans of beer, 2 Shots of liquor per week     Comment: OCCASIONALLY   Objective:   BP (!) 144/64 (BP Location: Left Arm, Patient Position: Sitting, Cuff Size: Large)   Temp 98.2 F (36.8 C) (Oral)   Resp 16   Physical Exam  Constitutional: He is oriented to person, place, and time. He appears well-developed.  Morbid obesity.  HENT:  Head: Normocephalic.  Eyes: Conjunctivae are normal.  Neck: Neck supple.  Cardiovascular: Normal rate.   Pulmonary/Chest: Effort normal.  Musculoskeletal: He exhibits edema, tenderness and deformity.  Tender over the dorsum of the right forefoot along the second metatarsal. More pain to bear weight. Good pulses. Tense edema and stasis dermatitis of both lower legs with healing skin tear over the right shin. Dupuytren's contractures in both hands. Tender and swollen right index finger PIP joint. No joint redness. Swollen non-tender right olecranon bursa.  Neurological: He is alert and oriented to  person, place, and time.      Assessment & Plan:     1. Pain in finger of right hand Onset over the past several days. No known injury. Pain primarily in the PIP joint of the right index finger. Will check labs and give prednisone taper. - Uric acid - Sedimentation rate - CBC with Differential/Platelet - predniSONE (DELTASONE) 10 MG tablet; Taper down by one tablet daily as directed (6,5,4,3,2,1)  Dispense: 21 tablet; Refill: 0 - Rheumatoid Factor  2. Dupuytren's contracture of both hands Progressive for years and was worse in the left  hand but now worse in the right. Had some needling procedures on the left years ago. Will treat with prednisone taper and schedule referral to hand surgeon. - predniSONE (DELTASONE) 10 MG tablet; Taper down by one tablet daily as directed (6,5,4,3,2,1)  Dispense: 21 tablet; Refill: 0 - Ambulatory referral to Hand Surgery - Rheumatoid Factor  3. Pain in right foot No known injury but pain in the forefoot over the past few days. Most intense pain is with weight bearing. Will treat with prednisone and get labs to rule out infection, arthritis or gout. Recommend x-ray evaluation to rule out stress fracture with his severe morbid obesity. Wants to try medication first before getting x-rays. - Uric acid - Sedimentation rate - CBC with Differential/Platelet - predniSONE (DELTASONE) 10 MG tablet; Taper down by one tablet daily as directed (6,5,4,3,2,1)  Dispense: 21 tablet; Refill: 0 - Rheumatoid Factor  4. Olecranon bursitis of right elbow Non-tender swelling of the right olecranon bursa. No known injury.       Dortha Kern, PA  [facility]

## 2016-07-23 NOTE — Patient Instructions (Signed)
Dupuytren's Contracture Dupuytren's contracture affects the fingers and the palm of the hand. This condition usually develops slowly. It may take many years to develop. The pinky finger and the ring finger are most often affected. These fingers start to curve inward, like a claw. At some point, the fingers cannot go straight anymore. This can make it hard to do things like:  Put on gloves.  Shake hands.  Grab something off a shelf. The condition usually does not cause pain and is not dangerous. The condition gets its name from the doctor who came up with an operation to fix the problem. His name was Baron Guillaume Dupuytren. Contracture means pulling inward. CAUSES  Dupuytren's contracture does not start with the fingers. It starts in the palm of the hand, under the skin. The tissue under the skin is called fascia. The fascia covers the cords (tendons) that control how the fingers move. In Dupuytren's contracture the fascia tissue becomes thick and then pulls on the cords. That causes the fingers to curl. The condition can affect both hands and any fingers, but it usually strikes one hand worse than the other. The fingers farthest from the thumb are most often the ones that curl. The cause is not clear. Some experts believe it results from an autoimmune reaction. That means the body's immune system (which fights off disease) attacks itself by mistake. What experts do know is that certain conditions and behaviors (called risk factors) make the chance of having this condition more likely. They include:  Age. Most people who have the condition are older than 50.  Sex. It affects men more often than women.  Family history. The condition tends to run in families from countries in [place].  Certain behaviors. People who smoke and drink alcohol are more apt to develop the problem.  Some other medical conditions. Having diabetes makes Dupuytren's contracture more likely. So does  having a condition that involves a seizure (when the brain's function is interrupted). SYMPTOMS  Signs of this condition take time to develop. Sometimes this takes weeks or months. More often, it takes several years.   Early symptoms:  Skin on the palm of the hand becomes thick. This is usually the first sign.  The skin may look dimpled or puckered.  Lumps (nodules) show up on the palm. There may be one or more lumps. They are not painful.  Later symptoms:  Thick cords of tissue form in the palm of the hand.  The pinky and ring fingers start to curl up into the palm.  The fingers cannot be straightened into their normal position. DIAGNOSIS  A physical examination is the main way that a healthcare provider can tell if you have Dupuytren's contracture. Other tests usually are not needed. The caregiver will probably:  Look at your hands. Feel your hands. This is to check for thickening and nodules.  Measure finger motion. This tells how much your fingers have contracted (pulled in).  Do a tabletop test. You will be asked to try to put your hand flat on a table, palm down. TREATMENT  There is no cure for Dupuytren's contracture. But there are ways to treat the symptoms. Options include:  Watching and waiting. The condition develops slowly. Often it does not create problems for a long time. Sometimes the skin gets thick and nodules form, but the fingers never curl. So, in some cases it is best to just watch the condition carefully and wait to see what happens.    Shots (injections). Different substances may be injected, including:  Steroids. These drugs block swelling. These shots should make the condition less uncomfortable. Steroids may also slow down the condition. Shots are given into the nodules. The effect only lasts awhile. More shots may have to be given.  Enzymes. These are proteins. They weaken the thick tissue. After an injection, the caregiver usually stretches the  fingers.  Needling. A needle is pushed through the skin and into the thick tissue. This is done in several spots. The goal is to break up the thickened tissue. Or to weaken it.  Surgery. This may be suggested if you cannot grasp objects. Or, if you can no longer put your hand in your pocket.  A cut (incision) is made in the palm of the hand. The thick tissue is removed.  Sometimes the thick tissue is attached to the skin. Then, the skin must be removed, too. It is replaced with a piece of skin from another place on your body. That is called a skin graft.  Occupational or hand therapy is almost always needed after surgery. This involves special exercises to get back the use of your hand and fingers. After a skin graft, several months of therapy may be needed.  Sometimes the condition comes back, even after surgery.  Other methods. You can do some things on your own. They include:  Stretching the fingers backwards. Do this often.  Warming the hand and massaging it. Again, do this often.  Using tools with padded grips. This should make things easier.  Wearing heavy gloves while working. This protects the hands. PROGNOSIS  Dupuytren's contracture usually develops slowly. There is no cure. But, the symptoms can be treated. Sometimes they come back after treatment, but not always. It is important to remember that this is a functional problem and not a life-threatening condition.   This information is not intended to replace advice given to you by your health care provider. Make sure you discuss any questions you have with your health care provider.   Document Released: 10/07/2009 Document Revised: 12/31/2014 Document Reviewed: 10/07/2009 Elsevier Interactive Patient Education 2016 Elsevier Inc. Bursitis Bursitis is inflammation and irritation of a bursa, which is one of the small, fluid-filled sacs that cushion and protect the moving parts of your body. These sacs are located between bones  and muscles, muscle attachments, or skin areas next to bones. A bursa protects these structures from the wear and tear that results from frequent movement. An inflamed bursa causes pain and swelling. Fluid may build up inside the sac. Bursitis is most common near joints, especially the knees, elbows, hips, and shoulders. CAUSES Bursitis can be caused by:   Injury from:  A direct blow, like falling on your knee or elbow.  Overuse of a joint (repetitive stress).  Infection. This can happen if bacteria gets into a bursa through a cut or scrape near a joint.  Diseases that cause joint inflammation, such as gout and rheumatoid arthritis. RISK FACTORS You may be at risk for bursitis if you:   Have a job or hobby that involves a lot of repetitive stress on your joints.  Have a condition that weakens your body's defense system (immune system), such as diabetes, cancer, or HIV.  Lift and reach overhead often.  Kneel or lean on hard surfaces often.  Run or walk often. SIGNS AND SYMPTOMS The most common signs and symptoms of bursitis are:  Pain that gets worse when you move the affected body part  or put weight on it.  Inflammation.  Stiffness. Other signs and symptoms may include:  Redness.  Tenderness.  Warmth.  Pain that continues after rest.  Fever and chills. This may occur in bursitis caused by infection. DIAGNOSIS Bursitis may be diagnosed by:   Medical history and physical exam.  MRI.  A procedure to drain fluid from the bursa with a needle (aspiration). The fluid may be checked for signs of infection or gout.  Blood tests to rule out other causes of inflammation. TREATMENT  Bursitis can usually be treated at home with rest, ice, compression, and elevation (RICE). For mild bursitis, RICE treatment may be all you need. Other treatments may include:  Nonsteroidal anti-inflammatory drugs (NSAIDs) to treat pain and inflammation.  Corticosteroids to fight  inflammation. You may have these drugs injected into and around the area of bursitis.  Aspiration of bursitis fluid to relieve pain and improve movement.  Antibiotic medicine to treat an infected bursa.  A splint, brace, or walking aid.  Physical therapy if you continue to have pain or limited movement.  Surgery to remove a damaged or infected bursa. This may be needed if you have a very bad case of bursitis or if other treatments have not worked. HOME CARE INSTRUCTIONS   Take medicines only as directed by your health care provider.  If you were prescribed an antibiotic medicine, finish it all even if you start to feel better.  Rest the affected area as directed by your health care provider.  Keep the area elevated.  Avoid activities that make pain worse.  Apply ice to the injured area:  Place ice in a plastic bag.  Place a towel between your skin and the bag.  Leave the ice on for 20 minutes, 2-3 times a day.  Use splints, braces, pads, or walking aids as directed by your health care provider.  Keep all follow-up visits as directed by your health care provider. This is important. PREVENTION   Wear knee pads if you kneel often.  Wear sturdy running or walking shoes that fit you well.  Take regular breaks from repetitive activity.  Warm up by stretching before doing any strenuous activity.  Maintain a healthy weight or lose weight as recommended by your health care provider. Ask your health care provider if you need help.  Exercise regularly. Start any new physical activity gradually. SEEK MEDICAL CARE IF:   Your bursitis is not responding to treatment or home care.  You have a fever.  You have chills.   This information is not intended to replace advice given to you by your health care provider. Make sure you discuss any questions you have with your health care provider.   Document Released: 12/07/2000 Document Revised: 08/31/2015 Document Reviewed:  03/01/2014 Elsevier Interactive Patient Education [employer]

## 2016-07-25 LAB — CBC WITH DIFFERENTIAL/PLATELET
BASOS ABS: 0 10*3/uL (ref 0.0–0.2)
BASOS: 0 %
EOS (ABSOLUTE): 0 10*3/uL (ref 0.0–0.4)
Eos: 0 %
Hematocrit: 41.1 % (ref 37.5–51.0)
Hemoglobin: 14.5 g/dL (ref 12.6–17.7)
IMMATURE GRANS (ABS): 0 10*3/uL (ref 0.0–0.1)
IMMATURE GRANULOCYTES: 0 %
LYMPHS: 4 %
Lymphocytes Absolute: 0.6 10*3/uL — ABNORMAL LOW (ref 0.7–3.1)
MCH: 31.7 pg (ref 26.6–33.0)
MCHC: 35.3 g/dL (ref 31.5–35.7)
MCV: 90 fL (ref 79–97)
Monocytes Absolute: 0.4 10*3/uL (ref 0.1–0.9)
Monocytes: 3 %
NEUTROS PCT: 93 %
Neutrophils Absolute: 13.3 10*3/uL — ABNORMAL HIGH (ref 1.4–7.0)
PLATELETS: 164 10*3/uL (ref 150–379)
RBC: 4.58 x10E6/uL (ref 4.14–5.80)
RDW: 14.8 % (ref 12.3–15.4)
WBC: 14.4 10*3/uL — ABNORMAL HIGH (ref 3.4–10.8)

## 2016-07-25 LAB — SEDIMENTATION RATE: Sed Rate: 17 mm/hr (ref 0–30)

## 2016-07-25 LAB — RHEUMATOID FACTOR: RHEUMATOID FACTOR: 10.1 [IU]/mL (ref 0.0–13.9)

## 2016-07-25 LAB — URIC ACID: URIC ACID: 12.9 mg/dL — AB (ref 3.7–8.6)

## 2016-07-27 ENCOUNTER — Other Ambulatory Visit: Payer: Self-pay

## 2016-07-27 DIAGNOSIS — M109 Gout, unspecified: Secondary | ICD-10-CM

## 2016-07-27 MED ORDER — CEPHALEXIN 500 MG PO CAPS: 500.0000 mg | ORAL_CAPSULE | Freq: Four times a day (QID) | ORAL | 0 refills | Status: DC

## 2016-07-27 MED ORDER — ALLOPURINOL 100 MG PO TABS: 100.0000 mg | ORAL_TABLET | Freq: Every day | ORAL | 3 refills | Status: DC

## 2016-07-27 NOTE — Telephone Encounter (Signed)
-----   Message from Jodell Cipro [place], [place] sent at 07/26/2016  1:40 PM EDT ----- Uric acid level very high which indicates gout very likely. The prednisone taper should help if some of the discomfort is caused by the gout. Need Allopurinol 100 mg QD #30 & 3 RF to bring uric acid level down. WBC count elevated and suggests some infection in the red skin in the lower legs. Recommend Keflex 500 mg QID #30. Recheck these blood levels in a month to be sure they are improved. Proceed with hand surgeon referral.

## 2016-07-27 NOTE — Telephone Encounter (Signed)
Pt advised as directed below.  I tried to send the prescriptions; the allopurinol has an interaction to Warfarin.  Is it okay to send.   Thanks,   -Vernona Rieger

## 2016-07-28 ENCOUNTER — Other Ambulatory Visit: Payer: Self-pay | Admitting: Family Medicine

## 2016-07-28 DIAGNOSIS — I5030 Unspecified diastolic (congestive) heart failure: Secondary | ICD-10-CM

## 2016-07-30 ENCOUNTER — Telehealth: Payer: Self-pay | Admitting: Family Medicine

## 2016-07-30 NOTE — Telephone Encounter (Signed)
Pt wants to know if  orthopaedic referral is still necessary since you have diagnosed him with gout

## 2016-07-30 NOTE — Telephone Encounter (Signed)
Should still consider letting the hand surgeon see if the Dupuytren's contractures can be helped.

## 2016-08-29 ENCOUNTER — Ambulatory Visit (INDEPENDENT_AMBULATORY_CARE_PROVIDER_SITE_OTHER): Payer: BLUE CROSS/BLUE SHIELD | Admitting: Family Medicine

## 2016-08-29 ENCOUNTER — Ambulatory Visit
Admission: RE | Admit: 2016-08-29 | Discharge: 2016-08-29 | Disposition: A | Discharge status: Home / Self Care | Payer: BLUE CROSS/BLUE SHIELD | Source: Ambulatory Visit | Attending: Family Medicine | Admitting: Family Medicine

## 2016-08-29 ENCOUNTER — Telehealth: Payer: Self-pay

## 2016-08-29 ENCOUNTER — Encounter: Payer: Self-pay | Admitting: Family Medicine

## 2016-08-29 ENCOUNTER — Other Ambulatory Visit: Payer: Self-pay | Admitting: Family Medicine

## 2016-08-29 VITALS — BP 132/100 | HR 83 | Temp 97.6°F | Resp 22

## 2016-08-29 DIAGNOSIS — I4891 Unspecified atrial fibrillation: Secondary | ICD-10-CM

## 2016-08-29 DIAGNOSIS — J189 Pneumonia, unspecified organism: Secondary | ICD-10-CM | POA: Diagnosis not present

## 2016-08-29 DIAGNOSIS — J440 Chronic obstructive pulmonary disease with acute lower respiratory infection: Secondary | ICD-10-CM | POA: Insufficient documentation

## 2016-08-29 DIAGNOSIS — R0600 Dyspnea, unspecified: Secondary | ICD-10-CM | POA: Diagnosis present

## 2016-08-29 DIAGNOSIS — I700 Atherosclerosis of aorta: Secondary | ICD-10-CM | POA: Insufficient documentation

## 2016-08-29 MED ORDER — LEVOFLOXACIN 500 MG PO TABS: 500.0000 mg | ORAL_TABLET | Freq: Every day | ORAL | 0 refills | Status: DC

## 2016-08-29 MED ORDER — PREDNISONE 10 MG PO TABS: ORAL_TABLET | ORAL | 0 refills | Status: AC

## 2016-08-29 NOTE — Progress Notes (Signed)
Subjective:     [facility]   DOB: [birth year], 60 y.o.   MRN: [id number]  HPI  Chief Complaint  Patient presents with  . Shortness of Breath    Patient comes in office today with complaints of shortness of breath for over two days.   States he notices it when he exerts himself: "I haven't been using my inhalers as my lungs have been clear." Had trouble with his pharmacy getting his medications refilled and just resumed metolazone two days ago.   Review of Systems     Objective:   Physical Exam  Constitutional: He appears well-developed and well-nourished. No distress (minimal discomfort at rest).  Cardiovascular: An irregularly irregular rhythm present.  Pulmonary/Chest:  Diminished breath sounds in posterior fields without wheezing.  Musculoskeletal: He exhibits no edema (of lower extremities).       Assessment:    1. Dyspnea - EKG 12-Lead - Renal function panel - CBC with Differential/Platelet - DG Chest 2 View; Future  2. Atrial fibrillation, unspecified - EKG 12-Lead    Plan:     Start his inhalers. Further f/u pending x-ray and lab results. Case discussed with Dr. Sherrie MustacheFisher.

## 2016-08-29 NOTE — Telephone Encounter (Signed)
Pt c/o SOB with swelling. Has a H/O CHF, and has run out of Metolazone. Denies chest pain. Allene DillonEmily Drozdowski, CMA

## 2016-08-29 NOTE — Patient Instructions (Signed)
We will call you with the x-ray report and lab results. Start albuterol and continue your fluid pills.

## 2016-08-30 ENCOUNTER — Telehealth: Payer: Self-pay | Admitting: Family Medicine

## 2016-08-30 LAB — CBC WITH DIFFERENTIAL/PLATELET
BASOS ABS: 0 10*3/uL (ref 0.0–0.2)
BASOS: 0 %
EOS (ABSOLUTE): 0.1 10*3/uL (ref 0.0–0.4)
EOS: 2 %
HEMOGLOBIN: 13.5 g/dL (ref 12.6–17.7)
Hematocrit: 40 % (ref 37.5–51.0)
Immature Grans (Abs): 0 10*3/uL (ref 0.0–0.1)
Immature Granulocytes: 0 %
Lymphocytes Absolute: 1.1 10*3/uL (ref 0.7–3.1)
Lymphs: 17 %
MCH: 30.3 pg (ref 26.6–33.0)
MCHC: 33.8 g/dL (ref 31.5–35.7)
MCV: 90 fL (ref 79–97)
MONOS ABS: 0.4 10*3/uL (ref 0.1–0.9)
Monocytes: 7 %
NEUTROS ABS: 4.8 10*3/uL (ref 1.4–7.0)
NEUTROS PCT: 74 %
PLATELETS: 137 10*3/uL — AB (ref 150–379)
RBC: 4.45 x10E6/uL (ref 4.14–5.80)
RDW: 14.3 % (ref 12.3–15.4)
WBC: 6.4 10*3/uL (ref 3.4–10.8)

## 2016-08-30 LAB — RENAL FUNCTION PANEL
Albumin: 4.2 g/dL (ref 3.5–5.5)
BUN/Creatinine Ratio: 15 (ref 9–20)
BUN: 21 mg/dL (ref 6–24)
CO2: 30 mmol/L — ABNORMAL HIGH (ref 18–29)
CREATININE: 1.41 mg/dL — AB (ref 0.76–1.27)
Calcium: 9.1 mg/dL (ref 8.7–10.2)
Chloride: 96 mmol/L (ref 96–106)
GFR, EST AFRICAN AMERICAN: 63 mL/min/{1.73_m2} (ref >59)
GFR, EST NON AFRICAN AMERICAN: 55 mL/min/{1.73_m2} — AB (ref >59)
Glucose: 196 mg/dL — ABNORMAL HIGH (ref 65–99)
PHOSPHORUS: 3.1 mg/dL (ref 2.5–4.5)
Potassium: 3.6 mmol/L (ref 3.5–5.2)
SODIUM: 142 mmol/L (ref 134–144)

## 2016-08-30 NOTE — Telephone Encounter (Signed)
Pt states he is returning a call to [place].  CB#[phone]/MW

## 2016-09-11 ENCOUNTER — Encounter: Payer: Self-pay | Admitting: Family Medicine

## 2016-09-11 ENCOUNTER — Encounter (INDEPENDENT_AMBULATORY_CARE_PROVIDER_SITE_OTHER): Payer: Self-pay

## 2016-09-11 ENCOUNTER — Ambulatory Visit (INDEPENDENT_AMBULATORY_CARE_PROVIDER_SITE_OTHER): Payer: BLUE CROSS/BLUE SHIELD | Admitting: Family Medicine

## 2016-09-11 DIAGNOSIS — N183 Chronic kidney disease, stage 3 unspecified: Secondary | ICD-10-CM

## 2016-09-11 DIAGNOSIS — E1140 Type 2 diabetes mellitus with diabetic neuropathy, unspecified: Secondary | ICD-10-CM

## 2016-09-11 DIAGNOSIS — G4733 Obstructive sleep apnea (adult) (pediatric): Secondary | ICD-10-CM | POA: Diagnosis not present

## 2016-09-11 DIAGNOSIS — I5032 Chronic diastolic (congestive) heart failure: Secondary | ICD-10-CM

## 2016-09-11 DIAGNOSIS — E785 Hyperlipidemia, unspecified: Secondary | ICD-10-CM

## 2016-09-11 DIAGNOSIS — I10 Essential (primary) hypertension: Secondary | ICD-10-CM | POA: Diagnosis not present

## 2016-09-11 DIAGNOSIS — M720 Palmar fascial fibromatosis [Dupuytren]: Secondary | ICD-10-CM | POA: Insufficient documentation

## 2016-09-11 DIAGNOSIS — I4891 Unspecified atrial fibrillation: Secondary | ICD-10-CM

## 2016-09-11 NOTE — Assessment & Plan Note (Signed)
Stable on Metoprolol and Warfarin.

## 2016-09-11 NOTE — Assessment & Plan Note (Signed)
Stable on CPAP 

## 2016-09-11 NOTE — Progress Notes (Signed)
Subjective:  Patient ID: Marco James, male    DOB: 1956/04/24  Age: 60 y.o. MRN: [id number]  CC: Establish care  HPI Marco James is a 60 y.o. male presents to the clinic today to establish care. Issues/concerns are below.  DM-2  Not at goal. A1C 05/29/16 was 7.7.  Prior to this he was taken of meds.  He has been back on medication for ~ 1 month. He is currently on  Glipizide 10 mg BID.  Recent blood sugars have been 150's or less.  He endorses compliance with medication.  HTN  Stable on Cardura, Lasix/Metolazone, Lisinopril, Metoprolol.   Morbid obesity  Patient is concerned about his weight and is interested in bariatric surgery.  He would like to discuss this today.  OSA  Stable on CPAP.  Afib  Stable at this time on Metoprolol and Warfarin. Followed by cardiology.  Diastolic CHF  Complicated by Morbid obesity.  Currently stable on Lasix/Metolazone, Lisinopril, Metoprolol.   HLD  Uncontrolled.  Needs to be addressed.  PMH, Surgical Hx, Family Hx, Social History reviewed and updated as below.  Past Medical History:  Diagnosis Date  . A-fib (HCC)   . Cellulitis and abscess of leg 2009  . CHF (congestive heart failure) (HCC)   . Dupuytren's disease   . External hemorrhoids   . GERD (gastroesophageal reflux disease)   . HTN (hypertension)   . Hyperlipidemia   . Hyperuricemia   . Obstructive sleep apnea   . Pneumonia   . Polysubstance abuse   . Shortness of breath dyspnea   . Toenail fungus   . Type 2 diabetes mellitus (HCC)    Past Surgical History:  Procedure Laterality Date  . COLONOSCOPY WITH PROPOFOL N/A 10/25/2015   Procedure: COLONOSCOPY WITH PROPOFOL;  Surgeon: Midge Miniumarren Wohl, MD;  Location: [facility] ENDOSCOPY;  Service: Endoscopy;  Laterality: N/A;  . NASAL SINUS SURGERY    . WISDOM TOOTH EXTRACTION     Family History  Problem Relation Age of Onset  . Heart disease Father 3677   Social History  Substance Use Topics  . Smoking  status: Former Smoker    Packs/day: 0.50    Years: 25.00    Types: Cigarettes  . Smokeless tobacco: Never Used  . Alcohol use 4.2 oz/week    5 Cans of beer, 2 Shots of liquor per week    Review of Systems  Respiratory: Positive for shortness of breath.   Cardiovascular: Positive for leg swelling.  Musculoskeletal: Positive for arthralgias.  All other systems reviewed and are negative.  Objective:   Today's Vitals: BP 138/84 (BP Location: Left Arm, Patient Position: Sitting, Cuff Size: Large)   Ht 5\' 11"  (1.803 m)   Wt (!) 397 lb 6 oz (180.2 kg)   SpO2 94%   BMI 55.42 kg/m   Physical Exam  Constitutional: He is oriented to person, place, and time.  Morbidly obese male in NAD.  HENT:  Head: Normocephalic and atraumatic.  Eyes: Conjunctivae are normal. Right eye exhibits no discharge. Left eye exhibits no discharge.  Neck: Normal range of motion.  Cardiovascular: Normal rate and regular rhythm.   LE edema noted bilaterally.  Pulmonary/Chest: Effort normal. He has no wheezes. He has no rales.  Abdominal: Soft. He exhibits no distension. There is no tenderness. There is no rebound and no guarding.  Musculoskeletal:  Both hands with Dupuytren's contracture  (5th digits).  Neurological: He is alert and oriented to person, place, and time.  Skin: Skin  is warm. No rash noted.  Psychiatric: He has a normal mood and affect.  Vitals reviewed.  Assessment & Plan:   Problem List Items Addressed This Visit    A-fib (HCC)    Stable on Metoprolol and Warfarin.      CKD (chronic kidney disease) stage 3, GFR 30-59 ml/min    Noted on review of labs. Needs close monitoring.      Diabetes mellitus, type 2 (HCC)    Uncontrolled. Improving after resuming medication. Currently on Glipizide. Will continue. Follow up A1C in 3 months.      Diastolic CHF, chronic (HCC)    Stable at this time. Continue current meds. Followed by cardiology.       Essential (primary) hypertension      Stable. Continue Cardura, Lasix/metolazone, metoprolol, lisinopril.      HLD (hyperlipidemia)    Uncontrolled. Unsure why patient is not on statin. Will repeat labs at follow up and discuss statin.      Morbid obesity (HCC)    Discussed bariatric surgery today. Patient is a candidate (given BMI and comorbidities). Discussed types of surgery and process to get this started (Handout from [facility] given). Of note, upon review of the EMR after his visit, I noted an encounter from psychiatry and 2016. He discusses patient's issues with substance abuse and alcohol abuse. At that time he was a high risk candidate for surgery.  His issues with alcohol and polysubstance abuse will need to be addressed prior to consideration for surgery.      Obstructive apnea    Stable on CPAP.       Other Visit Diagnoses   None.     Outpatient Encounter Prescriptions as of 09/11/2016  Medication Sig  . allopurinol (ZYLOPRIM) 100 MG tablet Take 1 tablet (100 mg total) by mouth daily.  Marland Kitchen doxazosin (CARDURA) 8 MG tablet Take 1 tablet (8 mg total) by mouth 2 (two) times daily.  . Fluticasone Furoate-Vilanterol 100-25 MCG/INH AEPB Inhale 1 puff into the lungs at bedtime.  . furosemide (LASIX) 80 MG tablet TAKE 1 TABLET BY MOUTH ONCE A DAY  . glipiZIDE (GLUCOTROL) 10 MG tablet Take 1 tablet (10 mg total) by mouth 2 (two) times daily before a meal.  . lisinopril (PRINIVIL,ZESTRIL) 40 MG tablet Take 1 tablet (40 mg total) by mouth daily.  . metolazone (ZAROXOLYN) 2.5 MG tablet TAKE 1 TABLET BY MOUTH DAILY AS NEEDED  . metoprolol (LOPRESSOR) 100 MG tablet Take 1 tablet (100 mg total) by mouth 2 (two) times daily.  . naproxen (NAPROSYN) 500 MG tablet TAKE 1 TABLET BY MOUTH TWICE A DAY WITH A MEAL  . NON FORMULARY CPAP  . potassium chloride SA (K-DUR,KLOR-CON) 20 MEQ tablet Take 1 tablet (20 mEq total) by mouth 2 (two) times daily.  Marland Kitchen warfarin (COUMADIN) 6 MG tablet Take 1 tablet (6 mg total) by mouth daily.  .  [DISCONTINUED] COLCRYS 0.6 MG tablet   . [DISCONTINUED] glipiZIDE (GLUCOTROL) 5 MG tablet   . [DISCONTINUED] cephALEXin (KEFLEX) 500 MG capsule Take 1 capsule (500 mg total) by mouth 4 (four) times daily.  . [DISCONTINUED] Fluticasone Furoate-Vilanterol 100-25 MCG/INH AEPB Inhale 1 puff into the lungs daily.  . [DISCONTINUED] levofloxacin (LEVAQUIN) 500 MG tablet Take 1 tablet (500 mg total) by mouth daily.   No facility-administered encounter medications on file as of 09/11/2016.     Follow-up: 3 months.   Everlene Other DO [facility]

## 2016-09-11 NOTE — Assessment & Plan Note (Signed)
Stable at this time. Continue current meds. Followed by cardiology.

## 2016-09-11 NOTE — Assessment & Plan Note (Signed)
Uncontrolled. Improving after resuming medication. Currently on Glipizide. Will continue. Follow up A1C in 3 months.

## 2016-09-11 NOTE — Assessment & Plan Note (Signed)
Uncontrolled. Unsure why patient is not on statin. Will repeat labs at follow up and discuss statin.

## 2016-09-11 NOTE — Progress Notes (Signed)
Pre visit review using our clinic review tool, if applicable. No additional management support is needed unless otherwise documented below in the visit note. 

## 2016-09-11 NOTE — Assessment & Plan Note (Signed)
Noted on review of labs. Needs close monitoring.

## 2016-09-11 NOTE — Patient Instructions (Signed)
Stop the Colchicine.  Continue your other medications.  Follow up in 3 months.  Call about bariatric surgery.  Take care  Dr. Adriana Simasook

## 2016-09-11 NOTE — Assessment & Plan Note (Signed)
Discussed bariatric surgery today. Patient is a candidate (given BMI and comorbidities). Discussed types of surgery and process to get this started (Handout from [facility] given). Of note, upon review of the EMR after his visit, I noted an encounter from psychiatry and 2016. He discusses patient's issues with substance abuse and alcohol abuse. At that time he was a high risk candidate for surgery.  His issues with alcohol and polysubstance abuse will need to be addressed prior to consideration for surgery.

## 2016-09-11 NOTE — Assessment & Plan Note (Signed)
Stable. Continue Cardura, Lasix/metolazone, metoprolol, lisinopril.

## 2016-09-24 ENCOUNTER — Other Ambulatory Visit: Payer: Self-pay

## 2016-09-24 DIAGNOSIS — I10 Essential (primary) hypertension: Secondary | ICD-10-CM

## 2016-09-24 DIAGNOSIS — I5030 Unspecified diastolic (congestive) heart failure: Secondary | ICD-10-CM

## 2016-09-24 DIAGNOSIS — E1140 Type 2 diabetes mellitus with diabetic neuropathy, unspecified: Secondary | ICD-10-CM

## 2016-09-24 DIAGNOSIS — Z8639 Personal history of other endocrine, nutritional and metabolic disease: Secondary | ICD-10-CM

## 2016-09-24 DIAGNOSIS — M109 Gout, unspecified: Secondary | ICD-10-CM

## 2016-09-24 DIAGNOSIS — J4541 Moderate persistent asthma with (acute) exacerbation: Secondary | ICD-10-CM

## 2016-09-24 MED ORDER — METOLAZONE 2.5 MG PO TABS: 2.5000 mg | ORAL_TABLET | Freq: Every day | ORAL | 3 refills | Status: DC | PRN

## 2016-09-24 MED ORDER — DOXAZOSIN MESYLATE 8 MG PO TABS: 8.0000 mg | ORAL_TABLET | Freq: Two times a day (BID) | ORAL | 3 refills | Status: DC

## 2016-09-24 MED ORDER — WARFARIN SODIUM 6 MG PO TABS: 6.0000 mg | ORAL_TABLET | Freq: Every day | ORAL | 6 refills | Status: DC

## 2016-09-24 MED ORDER — GLIPIZIDE 10 MG PO TABS: 10.0000 mg | ORAL_TABLET | Freq: Two times a day (BID) | ORAL | 3 refills | Status: DC

## 2016-09-24 MED ORDER — METOPROLOL TARTRATE 100 MG PO TABS: 100.0000 mg | ORAL_TABLET | Freq: Two times a day (BID) | ORAL | 3 refills | Status: DC

## 2016-09-24 MED ORDER — FLUTICASONE FUROATE-VILANTEROL 100-25 MCG/INH IN AEPB: 1.0000 | INHALATION_SPRAY | Freq: Every day | RESPIRATORY_TRACT | 11 refills | Status: DC

## 2016-09-24 MED ORDER — POTASSIUM CHLORIDE CRYS ER 20 MEQ PO TBCR: 20.0000 meq | EXTENDED_RELEASE_TABLET | Freq: Two times a day (BID) | ORAL | 6 refills | Status: DC

## 2016-09-24 MED ORDER — NAPROXEN 500 MG PO TABS: ORAL_TABLET | ORAL | 0 refills | Status: DC

## 2016-09-24 MED ORDER — LISINOPRIL 40 MG PO TABS: 40.0000 mg | ORAL_TABLET | Freq: Every day | ORAL | 3 refills | Status: DC

## 2016-09-24 MED ORDER — FUROSEMIDE 80 MG PO TABS: 80.0000 mg | ORAL_TABLET | Freq: Every day | ORAL | 0 refills | Status: DC

## 2016-09-24 MED ORDER — ALLOPURINOL 100 MG PO TABS: 100.0000 mg | ORAL_TABLET | Freq: Every day | ORAL | 3 refills | Status: DC

## 2016-09-24 NOTE — Telephone Encounter (Signed)
Patient is switching pharmacies. Patient is requesting all refills be sent to [facility] pharmacy.

## 2016-10-02 ENCOUNTER — Ambulatory Visit: Payer: BLUE CROSS/BLUE SHIELD | Admitting: Family Medicine

## 2016-10-17 ENCOUNTER — Ambulatory Visit (INDEPENDENT_AMBULATORY_CARE_PROVIDER_SITE_OTHER): Payer: BLUE CROSS/BLUE SHIELD | Admitting: Cardiovascular Disease

## 2016-10-17 ENCOUNTER — Encounter: Payer: Self-pay | Admitting: Cardiovascular Disease

## 2016-10-17 VITALS — BP 178/84 | HR 62 | Ht 71.0 in | Wt >= 6400 oz

## 2016-10-17 DIAGNOSIS — I482 Chronic atrial fibrillation, unspecified: Secondary | ICD-10-CM

## 2016-10-17 DIAGNOSIS — N183 Chronic kidney disease, stage 3 unspecified: Secondary | ICD-10-CM

## 2016-10-17 DIAGNOSIS — Z8639 Personal history of other endocrine, nutritional and metabolic disease: Secondary | ICD-10-CM

## 2016-10-17 DIAGNOSIS — I10 Essential (primary) hypertension: Secondary | ICD-10-CM | POA: Diagnosis not present

## 2016-10-17 DIAGNOSIS — E1140 Type 2 diabetes mellitus with diabetic neuropathy, unspecified: Secondary | ICD-10-CM

## 2016-10-17 DIAGNOSIS — I5032 Chronic diastolic (congestive) heart failure: Secondary | ICD-10-CM

## 2016-10-17 DIAGNOSIS — E7800 Pure hypercholesterolemia, unspecified: Secondary | ICD-10-CM

## 2016-10-17 DIAGNOSIS — R600 Localized edema: Secondary | ICD-10-CM

## 2016-10-17 MED ORDER — POTASSIUM CHLORIDE CRYS ER 20 MEQ PO TBCR: 20.0000 meq | EXTENDED_RELEASE_TABLET | Freq: Three times a day (TID) | ORAL | 6 refills | Status: DC

## 2016-10-17 NOTE — Patient Instructions (Signed)

## 2016-10-17 NOTE — Progress Notes (Signed)
Cardiology Office Note  Date:  10/17/2016   ID:  Marco James, DOB 25-Oct-1956, MRN [id number]  PCP:  Tommie Sams, DO   Chief Complaint  Patient presents with  . other    6 month.  SOB.      HPI:  Marco James is a 60 year old gentleman with morbid obesity,  hypertension, diabetes type 2, chronic lower extremity swelling, chronic atrial fibrillation who follows up for his atrial fibrillation  In follow-up today, Marco James has changed primary care physicians, now sees Dr. Adriana Simas. Marco James has expressed interest in gastric bypass surgery, Continues on Lasix 80 mg daily with metolazone 2.5 mg daily High fluid intake daily, occasional beers  Reports one episode in June 2017 was dehydrated, lightheaded Had blood work around that time, creatinine up to 1.6  Labs reviewed: HBA1C 7.7 Creatinine 1.6, BUN 31 05/29/2016: (was dehydrated on his bike) Baseline Cr 1.4  3 to 4 weeks ago, ran out of lasix Was SOB after several days, refilled his medications, feels like Marco James is back to his baseline. Marco James does not check his weight Quit smoking last year  EKG on today's visit shows atrial fibrillation with ventricular rate 62 bpm  In follow-up today, Marco James reports that his blood pressure has been relatively well-controlled, continues to be bothered by lower extremity edema Marco James has obstructive sleep apnea, uses CPAP, but has not seen a sleep physician in 12 years, no recent sleep study  Review of lab work shows hemoglobin A1c of 6, total cholesterol 170 Recent diabetes medication was held as numbers were doing well Denies any chest pain concerning for angina Tolerating warfarin though for Medicare has not checked this on regular basis.. Last INR was August 2016. Reports that Marco James only checks this every 6 months or so Marco James takes Lasix daily with metolazone, takes potassium 40 mEq daily   Other past medical history  taking Lasix 80 mg in the morning with metolazone 2.5 mg daily.    uncertain if Marco James has had prior  workup with previous cardiologist including echocardiogram. Marco James denies having a stress test   PMH:   has a past medical history of A-fib (HCC); Cellulitis and abscess of leg (2009); CHF (congestive heart failure) (HCC); Dupuytren's disease; External hemorrhoids; GERD (gastroesophageal reflux disease); HTN (hypertension); Hyperlipidemia; Hyperuricemia; Obstructive sleep apnea; Pneumonia; Polysubstance abuse; Shortness of breath dyspnea; Toenail fungus; and Type 2 diabetes mellitus (HCC).  PSH:    Past Surgical History:  Procedure Laterality Date  . COLONOSCOPY WITH PROPOFOL N/A 10/25/2015   Procedure: COLONOSCOPY WITH PROPOFOL;  Surgeon: Midge Minium, MD;  Location: [facility] ENDOSCOPY;  Service: Endoscopy;  Laterality: N/A;  . NASAL SINUS SURGERY    . WISDOM TOOTH EXTRACTION      Current Outpatient Prescriptions  Medication Sig Dispense Refill  . allopurinol (ZYLOPRIM) 100 MG tablet Take 1 tablet (100 mg total) by mouth daily. 30 tablet 3  . doxazosin (CARDURA) 8 MG tablet Take 1 tablet (8 mg total) by mouth 2 (two) times daily. 180 tablet 3  . fluticasone furoate-vilanterol (BREO ELLIPTA) 100-25 MCG/INH AEPB Inhale 1 puff into the lungs at bedtime. 1 each 11  . furosemide (LASIX) 80 MG tablet Take 1 tablet (80 mg total) by mouth daily. 90 tablet 0  . glipiZIDE (GLUCOTROL) 10 MG tablet Take 1 tablet (10 mg total) by mouth 2 (two) times daily before a meal. 60 tablet 3  . lisinopril (PRINIVIL,ZESTRIL) 40 MG tablet Take 1 tablet (40 mg total) by mouth daily. 90 tablet 3  .  metolazone (ZAROXOLYN) 2.5 MG tablet Take 1 tablet (2.5 mg total) by mouth daily as needed. 30 tablet 3  . metoprolol (LOPRESSOR) 100 MG tablet Take 1 tablet (100 mg total) by mouth 2 (two) times daily. 180 tablet 3  . naproxen (NAPROSYN) 500 MG tablet TAKE 1 TABLET BY MOUTH TWICE A DAY WITH A MEAL 180 tablet 0  . NON FORMULARY CPAP    . potassium chloride SA (K-DUR,KLOR-CON) 20 MEQ tablet Take 1 tablet (20 mEq total) by mouth 2  (two) times daily. 60 tablet 6  . warfarin (COUMADIN) 6 MG tablet Take 1 tablet (6 mg total) by mouth daily. 30 tablet 6   No current facility-administered medications for this visit.      Allergies:   Review of patient's allergies indicates no known allergies.   Social History:  The patient  reports that Marco James has quit smoking. His smoking use included Cigarettes. Marco James has a 12.50 pack-year smoking history. Marco James has never used smokeless tobacco. Marco James reports that Marco James drinks about 4.2 oz of alcohol per week . Marco James reports that Marco James does not use drugs.   Family History:   family history includes Heart disease (age of onset: 1977) in his father.    Review of Systems: Review of Systems  Constitutional: Negative.   Respiratory: Negative.   Cardiovascular: Negative.   Gastrointestinal: Negative.   Musculoskeletal: Negative.   Neurological: Negative.   Psychiatric/Behavioral: Negative.   All other systems reviewed and are negative.    PHYSICAL EXAM: VS:  BP (!) 178/84 (BP Location: Left Arm, Patient Position: Sitting, Cuff Size: Large)   Ht 5\' 11"  (1.803 m)   Wt (!) 402 lb 12 oz (182.7 kg)   BMI 56.17 kg/m  , BMI Body mass index is 56.17 kg/m.  Repeat blood pressure 160 systolic over 100 GEN: Well nourished, well developed, in no acute distress , obese HEENT: normal  Neck: no JVD, carotid bruits, or masses Cardiac: RRR; no murmurs, rubs, or gallops,no edema  Respiratory:  clear to auscultation bilaterally, normal work of breathing GI: soft, nontender, nondistended, + BS MS: no deformity or atrophy  Skin: warm and dry, no rash Neuro:  Strength and sensation are intact Psych: euthymic mood, full affect    Recent Labs: 05/29/2016: ALT 17; TSH 2.290 08/29/2016: BUN 21; Creatinine, Ser 1.41; Platelets 137; Potassium 3.6; Sodium 142    Lipid Panel Lab Results  Component Value Date   CHOL 199 05/22/2014   HDL 38 (L) 05/22/2014   LDLCALC 137 (H) 05/22/2014   TRIG 122 05/22/2014      Wt  Readings from Last 3 Encounters:  10/17/16 (!) 402 lb 12 oz (182.7 kg)  09/11/16 (!) 397 lb 6 oz (180.2 kg)  05/29/16 (!) 398 lb (180.5 kg)       ASSESSMENT AND PLAN:  Diastolic CHF, chronic (HCC) For now would stay on Lasix 80 mg daily with metolazone as needed. Marco James prefers to take metolazone every day Recommended Marco James increase his potassium, add extra pill 3 days per week  Chronic atrial fibrillation (HCC) Rate well controlled,  Warfarin managed by primary care. Previously managed by Dr. Sherrie MustacheFisher Marco James prefers not to have this checked on a regular basis  Morbid obesity [facility](HCC) Long discussion concerning options for weight loss. Marco James is interested in bariatric surgery, about to start paperwork Would likely be acceptable surgical risk weight loss would be beneficial  Essential (primary) hypertension Blood pressure is well controlled on today's visit. No changes made to the  medications.  Pure hypercholesterolemia Reports that Marco James does not have a cholesterol problem No recent lipids in our system. No known coronary disease Given his diabetes, goal LDL should be less than 100  Bilateral lower extremity edema Stable chronic leg edema, recommended compression hose  Type 2 diabetes mellitus with diabetic neuropathy, unspecified long term insulin use status (HCC) Hemoglobin A1c 7.7. Recommended strict diet, low carbohydrates  CKD (chronic kidney disease) stage 3, GFR 30-59 ml/min Baseline creatinine 1.3, 1.4 with diuretics, 1.6 with overdiuresis   Total encounter time more than 25 minutes  Greater than 50% was spent in counseling and coordination of care with the patient   Disposition:   F/U  6 months  No orders of the defined types were placed in this encounter.    Signed, Dossie Arbour, M.D., Ph.D. 10/17/2016  [facility] [place], [place] [phone]

## 2016-10-18 ENCOUNTER — Telehealth: Payer: Self-pay | Admitting: Cardiovascular Disease

## 2016-10-18 NOTE — Telephone Encounter (Signed)
Someone requested [facility] records from [facility] yesterday.  Patient had a new patient appt. With [facility] in 2015 that was made and cancelled.  Patient has never been seen by [facility] cardiology.  No records available per Lupita Leashonna at [facility].

## 2016-12-10 ENCOUNTER — Other Ambulatory Visit: Payer: Self-pay | Admitting: Surgery

## 2016-12-11 ENCOUNTER — Encounter: Payer: Self-pay | Admitting: Family Medicine

## 2016-12-11 ENCOUNTER — Ambulatory Visit (INDEPENDENT_AMBULATORY_CARE_PROVIDER_SITE_OTHER): Payer: BLUE CROSS/BLUE SHIELD | Admitting: Family Medicine

## 2016-12-11 DIAGNOSIS — R0600 Dyspnea, unspecified: Secondary | ICD-10-CM | POA: Diagnosis not present

## 2016-12-11 MED ORDER — PREDNISONE 50 MG PO TABS: ORAL_TABLET | ORAL | 0 refills | Status: DC

## 2016-12-11 MED ORDER — DOXYCYCLINE HYCLATE 100 MG PO TABS
100.0000 mg | ORAL_TABLET | Freq: Two times a day (BID) | ORAL | 0 refills | Status: DC
Start: 2016-12-11 — End: 2017-01-22

## 2016-12-11 NOTE — Assessment & Plan Note (Signed)
New acute problem. Suspect that this is secondary to underlying COPD and acute exacerbation of that in addition to congestive heart failure as he has been holding his diuretic. His most recent chest x-ray revealed findings consistent with COPD. Advised to resume diuretic. Starting patient on prednisone and doxycycline. Is on warfarin and will need an INR later this week.   **Of note, this is the second time I have seen the patient. He is being followed by cardiology and I was under the impression that they were managing his warfarin. Per cardiology, his former PCP was managing. He has not had an INR since 2016.

## 2016-12-11 NOTE — Progress Notes (Signed)
Pre visit review using our clinic review tool, if applicable. No additional management support is needed unless otherwise documented below in the visit note. 

## 2016-12-11 NOTE — Patient Instructions (Signed)
Resume diuretic.  Inform cardiology about antibiotic so that your warfarin can be adjusted.  Doxy and Prednisone.  Feel better and Merry Christmas  Dr. Adriana Simasook

## 2016-12-11 NOTE — Progress Notes (Signed)
Subjective:  Patient ID: Marco James, male    DOB: 06-23-1956  Age: 60 y.o. MRN: [id number]  CC: Cough, SOB, Wheezing  HPI:  60 year old male with morbid obesity, OSA, diastolic heart failure, DM 2, CKD, hypertension, hyperlipidemia presents with the above complaints.  Patient states that he's been sick for the past 2 weeks. He states that he's had shortness of breath, cough, wheezing. Symptoms are severe. Cough is mildly productive. Additionally, he's felt like his volume status was up. He has recently held his diuretic in preparation for upcoming labs by bariatric surgeon. He is a former smoker. No reported history of COPD. No medications or interventions tried. No known exacerbating or relieving factors. No other complaints or concerns at this time.  Social Hx   Social History   Social History  . Marital status: Divorced    Spouse name: N/A  . Number of children: N/A  . Years of education: N/A   Social History Main Topics  . Smoking status: Former Smoker    Packs/day: 0.50    Years: 25.00    Types: Cigarettes  . Smokeless tobacco: Never Used  . Alcohol use 4.2 oz/week    5 Cans of beer, 2 Shots of liquor per week  . Drug use: No     Comment: Pt. used cocaine for 3 years, marijuana  . Sexual activity: Not Asked   Other Topics Concern  . None   Social History Narrative  . None    Review of Systems  Constitutional: Negative for fever.  Respiratory: Positive for cough, shortness of breath and wheezing.    Objective:  BP 140/70 (BP Location: Left Arm, Patient Position: Sitting, Cuff Size: Large)   Pulse 60   Temp 98 F (36.7 C) (Oral)   Wt (!) 404 lb 4 oz (183.4 kg)   SpO2 93%   BMI 56.38 kg/m   BP/Weight 12/11/2016 10/17/2016 09/11/2016  Systolic BP 140 178 138  Diastolic BP 70 84 84  Wt. (Lbs) 404.25 402.75 397.38  BMI 56.38 56.17 55.42    Physical Exam  Constitutional: He is oriented to person, place, and time.  Chronically ill-appearing, morbidly  obese male. Appears dyspneic.  HENT:  Mouth/Throat: Oropharynx is clear and moist.  Neck: Neck supple.  Cardiovascular: Normal rate and regular rhythm.   1+ edema.  Pulmonary/Chest:  Increased work of breathing. Expiratory wheezing noted.  Neurological: He is alert and oriented to person, place, and time.  Vitals reviewed.  Lab Results  Component Value Date   WBC 6.4 08/29/2016   HGB 15.0 02/15/2015   HCT 40.0 08/29/2016   PLT 137 (L) 08/29/2016   GLUCOSE 196 (H) 08/29/2016   CHOL 199 05/22/2014   TRIG 122 05/22/2014   HDL 38 (L) 05/22/2014   LDLCALC 137 (H) 05/22/2014   ALT 17 05/29/2016   AST 20 05/29/2016   NA 142 08/29/2016   K 3.6 08/29/2016   CL 96 08/29/2016   CREATININE 1.41 (H) 08/29/2016   BUN 21 08/29/2016   CO2 30 (H) 08/29/2016   TSH 2.290 05/29/2016   INR 2.1 08/24/2015   HGBA1C 7.7 05/29/2016    Assessment & Plan:   Problem List Items Addressed This Visit    Acute dyspnea    New acute problem. Suspect that this is secondary to underlying COPD and acute exacerbation of that in addition to congestive heart failure as he has been holding his diuretic. His most recent chest x-ray revealed findings consistent with COPD. Advised to  resume diuretic. Starting patient on prednisone and doxycycline. Is on warfarin and will need an INR later this week.   **Of note, this is the second time I have seen the patient. He is being followed by cardiology and I was under the impression that they were managing his warfarin. Per cardiology, his former PCP was managing. He has not had an INR since 2016.          Meds ordered this encounter  Medications  . predniSONE (DELTASONE) 50 MG tablet    Sig: 1 tablet daily x 5 days.    Dispense:  5 tablet    Refill:  0  . doxycycline (VIBRA-TABS) 100 MG tablet    Sig: Take 1 tablet (100 mg total) by mouth 2 (two) times daily.    Dispense:  14 tablet    Refill:  0    Follow-up: INR later this week.   Marco OtherJayce Kaliopi Blyden  DO [facility]

## 2016-12-13 ENCOUNTER — Other Ambulatory Visit: Payer: Self-pay

## 2016-12-13 DIAGNOSIS — Z5181 Encounter for therapeutic drug level monitoring: Secondary | ICD-10-CM

## 2016-12-13 DIAGNOSIS — Z7901 Long term (current) use of anticoagulants: Principal | ICD-10-CM

## 2016-12-14 ENCOUNTER — Other Ambulatory Visit: Payer: Self-pay

## 2016-12-18 ENCOUNTER — Other Ambulatory Visit: Payer: Self-pay

## 2016-12-21 ENCOUNTER — Encounter: Payer: Self-pay | Admitting: Podiatry

## 2016-12-21 ENCOUNTER — Ambulatory Visit (INDEPENDENT_AMBULATORY_CARE_PROVIDER_SITE_OTHER): Payer: BLUE CROSS/BLUE SHIELD | Admitting: Podiatry

## 2016-12-21 DIAGNOSIS — L84 Corns and callosities: Secondary | ICD-10-CM

## 2016-12-21 DIAGNOSIS — L851 Acquired keratosis [keratoderma] palmaris et plantaris: Secondary | ICD-10-CM | POA: Diagnosis not present

## 2016-12-21 DIAGNOSIS — L819 Disorder of pigmentation, unspecified: Secondary | ICD-10-CM

## 2016-12-21 DIAGNOSIS — L603 Nail dystrophy: Secondary | ICD-10-CM | POA: Diagnosis not present

## 2016-12-21 DIAGNOSIS — L608 Other nail disorders: Secondary | ICD-10-CM

## 2016-12-21 DIAGNOSIS — M79609 Pain in unspecified limb: Secondary | ICD-10-CM | POA: Diagnosis not present

## 2016-12-21 DIAGNOSIS — E1143 Type 2 diabetes mellitus with diabetic autonomic (poly)neuropathy: Secondary | ICD-10-CM

## 2016-12-21 DIAGNOSIS — E0843 Diabetes mellitus due to underlying condition with diabetic autonomic (poly)neuropathy: Secondary | ICD-10-CM | POA: Diagnosis not present

## 2016-12-21 DIAGNOSIS — B351 Tinea unguium: Secondary | ICD-10-CM | POA: Diagnosis not present

## 2016-12-21 DIAGNOSIS — M79672 Pain in left foot: Secondary | ICD-10-CM | POA: Diagnosis not present

## 2016-12-21 DIAGNOSIS — I872 Venous insufficiency (chronic) (peripheral): Secondary | ICD-10-CM | POA: Diagnosis not present

## 2016-12-21 DIAGNOSIS — Q828 Other specified congenital malformations of skin: Secondary | ICD-10-CM | POA: Diagnosis not present

## 2016-12-21 DIAGNOSIS — M79671 Pain in right foot: Secondary | ICD-10-CM | POA: Diagnosis not present

## 2016-12-21 NOTE — Progress Notes (Signed)
SUBJECTIVE Patient with a history of diabetes mellitus presents to office today complaining of elongated, thickened nails. Pain while ambulating in shoes. Patient is unable to trim their own nails. Patient also presents for swelling to the bilateral lower extremities. Patient states that this is his first time to be evaluated as a diabetic by a podiatrist and presents for a comprehensive diabetic evaluation.   No Known Allergies  OBJECTIVE General Patient is awake, alert, and oriented x 3 and in no acute distress. Derm Skin is dry and supple bilateral. Negative open lesions or macerations. Hyperkeratotic and his age and with flaking callus formation noted to the weightbearing surfaces of the bilateral feet Nails are tender, long, thickened and dystrophic with subungual debris, consistent with onychomycosis, 1-5 bilateral. No signs of infection noted. Vasc  bilateral lower extremity edema noted with hemosiderin deposition of the bilateral lower extremities. Varicosities noted. PT and DP pulses palpable bilateral Neuro Epicritic and protective threshold sensation diminished bilaterally.  Musculoskeletal Exam No symptomatic pedal deformities noted bilateral. Muscular strength within normal limits.  ASSESSMENT 1. Diabetes Mellitus w/ peripheral neuropathy 2. Onychomycosis of nail due to dermatophyte bilateral 3. Pain in foot bilateral 4. Edema Bilateral lower extremity 5. Venous insufficiency bilateral 6. Xerosis of skin bilateral  PLAN OF CARE 1. Patient evaluated today. 2. Instructed to maintain good pedal hygiene and foot care. Stressed importance of controlling blood sugar.  3. Mechanical debridement of nails 1-5 bilaterally performed using a nail nipper. Filed with dremel without incident.  4. Excisional debridement of hyperkeratotic callus lesions was performed to the weightbearing surfaces of the bilateral feet 6 using a tissue nipper without incident or bleeding  5. Prescription for  below knee compression stockings 20-7230mmHg 6. Return to clinic in 3 mos.     Felecia ShellingBrent M. Obdulio Mash, DPM [facility]  Dr. Felecia ShellingBrent M. Koichi Platte, DPM   [address]                                        [place], [place] [address]                Office [phone]  Fax [phone]

## 2016-12-25 ENCOUNTER — Other Ambulatory Visit: Payer: Self-pay | Admitting: Orthopedic Surgery

## 2016-12-28 ENCOUNTER — Other Ambulatory Visit (INDEPENDENT_AMBULATORY_CARE_PROVIDER_SITE_OTHER): Payer: BLUE CROSS/BLUE SHIELD

## 2016-12-28 ENCOUNTER — Telehealth: Payer: Self-pay | Admitting: Family Medicine

## 2016-12-28 DIAGNOSIS — E119 Type 2 diabetes mellitus without complications: Secondary | ICD-10-CM | POA: Diagnosis not present

## 2016-12-28 DIAGNOSIS — Z7901 Long term (current) use of anticoagulants: Secondary | ICD-10-CM

## 2016-12-28 DIAGNOSIS — Z5181 Encounter for therapeutic drug level monitoring: Secondary | ICD-10-CM | POA: Diagnosis not present

## 2016-12-28 LAB — PROTIME-INR
INR: 2 ratio — ABNORMAL HIGH (ref 0.8–1.0)
Prothrombin Time: 21.1 s — ABNORMAL HIGH (ref 9.6–13.1)

## 2016-12-28 LAB — HEMOGLOBIN A1C: Hgb A1c MFr Bld: 6.5 % (ref 4.6–6.5)

## 2016-12-28 NOTE — Addendum Note (Signed)
Addended by: Felix AhmadiFRANSEN, Jailyn Langhorst A on: 12/28/2016 03:24 PM   Modules accepted: Orders

## 2016-12-28 NOTE — Telephone Encounter (Signed)
Pt was advised of Dr.Cook's comments.

## 2016-12-28 NOTE — Telephone Encounter (Signed)
Albuterol as needed. Continue Breo. Needs to see cardiology and pulm.

## 2016-12-28 NOTE — Telephone Encounter (Signed)
Pt stated he was still having wheezing other than that his fine.

## 2016-12-28 NOTE — Telephone Encounter (Signed)
Pt came into office. He is having his INR done. Would like his A1C done also.  Pt saw Dr. Adriana Simasook before Christmas, he is still not feeling well. Still wheezing. Pt is requesting medication. Please call at [phone].

## 2017-01-01 ENCOUNTER — Encounter: Payer: BLUE CROSS/BLUE SHIELD | Attending: Surgery | Admitting: Dietician

## 2017-01-01 ENCOUNTER — Encounter: Payer: Self-pay | Admitting: Dietician

## 2017-01-01 DIAGNOSIS — Z713 Dietary counseling and surveillance: Secondary | ICD-10-CM | POA: Insufficient documentation

## 2017-01-01 NOTE — Progress Notes (Signed)
  Pre-Op Assessment Visit:  Pre-Operative Sleeve gastrectomy Surgery  Medical Nutrition Therapy:  Appt start time: 215   End time:  330.  Patient was seen on 01/01/2017 for Pre-Operative Nutrition Assessment. [facility] on 01/01/2017.   Preferred Learning Style:   No preference indicated   Learning Readiness:   Ready  Handouts given during visit include:  Pre-Op Goals Bariatric Surgery Protein Shakes   During the appointment today the following Pre-Op Goals were reviewed with the patient: Maintain or lose weight as instructed by your surgeon Make healthy food choices Begin to limit portion sizes Limited concentrated sugars and fried foods Keep fat/sugar in the single digits per serving on   food labels Practice CHEWING your food  (aim for 30 chews per bite or until applesauce consistency) Practice not drinking 15 minutes before, during, and 30 minutes after each meal/snack Avoid all carbonated beverages  Avoid/limit caffeinated beverages  Avoid all sugar-sweetened beverages Consume 3 meals per day; eat every 3-5 hours Make a list of non-food related activities Aim for 64-100 ounces of FLUID daily  Aim for at least 60-80 grams of PROTEIN daily Look for a liquid protein source that contain ?15 g protein and ?5 g carbohydrate  (ex: shakes, drinks, shots)  Demonstrated degree of understanding via:  Teach Back  Teaching Method Utilized:  Visual Auditory Hands on  Barriers to learning/adherence to lifestyle change: none  [facility] to call [facility] to enroll in Pre-Op and Post-Op Nutrition Education when surgery date is scheduled.

## 2017-01-03 ENCOUNTER — Ambulatory Visit
Admission: RE | Admit: 2017-01-03 | Discharge: 2017-01-03 | Disposition: A | Discharge status: Home / Self Care | Payer: BLUE CROSS/BLUE SHIELD | Source: Ambulatory Visit | Attending: Surgery | Admitting: Surgery

## 2017-01-03 DIAGNOSIS — I493 Ventricular premature depolarization: Secondary | ICD-10-CM | POA: Diagnosis not present

## 2017-01-03 DIAGNOSIS — K449 Diaphragmatic hernia without obstruction or gangrene: Secondary | ICD-10-CM | POA: Diagnosis not present

## 2017-01-03 DIAGNOSIS — I4581 Long QT syndrome: Secondary | ICD-10-CM | POA: Insufficient documentation

## 2017-01-03 DIAGNOSIS — I4891 Unspecified atrial fibrillation: Secondary | ICD-10-CM | POA: Insufficient documentation

## 2017-01-03 DIAGNOSIS — K5710 Diverticulosis of small intestine without perforation or abscess without bleeding: Secondary | ICD-10-CM | POA: Insufficient documentation

## 2017-01-18 ENCOUNTER — Other Ambulatory Visit: Payer: Self-pay | Admitting: Family Medicine

## 2017-01-18 DIAGNOSIS — I5030 Unspecified diastolic (congestive) heart failure: Secondary | ICD-10-CM

## 2017-01-22 ENCOUNTER — Encounter (INDEPENDENT_AMBULATORY_CARE_PROVIDER_SITE_OTHER): Payer: Self-pay

## 2017-01-22 ENCOUNTER — Ambulatory Visit (INDEPENDENT_AMBULATORY_CARE_PROVIDER_SITE_OTHER): Payer: BLUE CROSS/BLUE SHIELD | Admitting: Family Medicine

## 2017-01-22 ENCOUNTER — Encounter: Payer: Self-pay | Admitting: Family Medicine

## 2017-01-22 VITALS — BP 116/79 | HR 90 | Temp 97.7°F | Wt 384.6 lb

## 2017-01-22 DIAGNOSIS — J410 Simple chronic bronchitis: Secondary | ICD-10-CM | POA: Diagnosis not present

## 2017-01-22 DIAGNOSIS — J42 Unspecified chronic bronchitis: Secondary | ICD-10-CM | POA: Insufficient documentation

## 2017-01-22 DIAGNOSIS — S81801A Unspecified open wound, right lower leg, initial encounter: Secondary | ICD-10-CM | POA: Diagnosis not present

## 2017-01-22 MED ORDER — HYDROCOD POLST-CPM POLST ER 10-8 MG/5ML PO SUER: 5.0000 mL | Freq: Two times a day (BID) | ORAL | 0 refills | Status: DC | PRN

## 2017-01-22 MED ORDER — FLUTICASONE FUROATE-VILANTEROL 100-25 MCG/INH IN AEPB: 1.0000 | INHALATION_SPRAY | Freq: Every day | RESPIRATORY_TRACT | 0 refills | Status: DC

## 2017-01-22 NOTE — Assessment & Plan Note (Signed)
New problem. Superficial wound, likely secondary venous stasis and possible trauma that he is unaware of. No evidence of infection. Wound cleansed with saline and dressed with petroleum gauze and wrapped in Coban.

## 2017-01-22 NOTE — Assessment & Plan Note (Signed)
Established problem, worsening per patient report. Lung exam unremarkable today. Starting back on Breo. Samples given today. Recommended pulmonary function tests and he declined. Tussionex for cough.

## 2017-01-22 NOTE — Patient Instructions (Signed)
Continue your meds.  Start the OsgoodBreo.  Follow up in 3 months.  Take care  Dr. Adriana Simasook

## 2017-01-22 NOTE — Progress Notes (Signed)
Subjective:  Patient ID: Marco James, male    DOB: 1956/10/01  Age: 61 y.o. MRN: [id number]  CC: Cough, SOB, wheezing; leg wound  HPI:  61 year old male with an extensive past medical history including OSA, morbid obesity, DM 2, hypertension, hyperlipidemia, diastolic heart failure, CKD presents with the above complaints.  Patient states that he has not felt well since December. I previously saw him on December 19 and treated him with doxycycline and prednisone (for multifactorial dyspnea; which seemed predominantly related to COPD). Patient continues to endorse cough, congestion, shortness of breath, fatigue, and wheezing. He uses albuterol as needed. He has never had chronic function tests. He was previously put on Breo and has not been using it. No associated fevers or chills. No relieving factors.  Additionally, patient states that on Saturday he noticed a wound on his right lower leg (distal calf). He's had some bleeding. No drainage. He has not applied anything or did anything to dress the wound. He does not recall an injury.  Social Hx   Social History   Social History  . Marital status: Divorced    Spouse name: N/A  . Number of children: N/A  . Years of education: N/A   Social History Main Topics  . Smoking status: Former Smoker    Packs/day: 0.50    Years: 25.00    Types: Cigarettes  . Smokeless tobacco: Never Used  . Alcohol use 4.2 oz/week    5 Cans of beer, 2 Shots of liquor per week  . Drug use: No     Comment: Pt. used cocaine for 3 years, marijuana  . Sexual activity: Not Asked   Other Topics Concern  . None   Social History Narrative  . None    Review of Systems  HENT: Positive for congestion.   Respiratory: Positive for cough, chest tightness, shortness of breath and wheezing.   Skin: Positive for wound.   Objective:  BP 116/79   Pulse 90   Temp 97.7 F (36.5 C) (Oral)   Wt (!) 384 lb 9.6 oz (174.5 kg)   SpO2 92%   BMI 53.64 kg/m    BP/Weight 01/22/2017 01/01/2017 12/11/2016  Systolic BP 116 - 140  Diastolic BP 79 - 70  Wt. (Lbs) 384.6 390.8 404.25  BMI 53.64 54.51 56.38   Physical Exam  Constitutional: He is oriented to person, place, and time.  Morbidly obese male in no acute distress.  Cardiovascular: Normal rate and regular rhythm.   Pulmonary/Chest: Effort normal and breath sounds normal. He has no wheezes. He has no rales.  Neurological: He is alert and oriented to person, place, and time.  Skin:  Right lower leg - superficial wound noted. Bleeding noted and was controlled with pressure. No purulent discharge. No surrounding erythema.  Psychiatric:  Flat affect. Depressed mood.  Vitals reviewed.  Lab Results  Component Value Date   WBC 6.4 08/29/2016   HGB 15.0 02/15/2015   HCT 40.0 08/29/2016   PLT 137 (L) 08/29/2016   GLUCOSE 196 (H) 08/29/2016   CHOL 199 05/22/2014   TRIG 122 05/22/2014   HDL 38 (L) 05/22/2014   LDLCALC 137 (H) 05/22/2014   ALT 17 05/29/2016   AST 20 05/29/2016   NA 142 08/29/2016   K 3.6 08/29/2016   CL 96 08/29/2016   CREATININE 1.41 (H) 08/29/2016   BUN 21 08/29/2016   CO2 30 (H) 08/29/2016   TSH 2.290 05/29/2016   INR 2.0 (H) 12/28/2016   HGBA1C  6.5 12/28/2016    Assessment & Plan:   Problem List Items Addressed This Visit    Leg wound, right    New problem. Superficial wound, likely secondary venous stasis and possible trauma that he is unaware of. No evidence of infection. Wound cleansed with saline and dressed with petroleum gauze and wrapped in Coban.      Chronic bronchitis (HCC) - Primary    Established problem, worsening per patient report. Lung exam unremarkable today. Starting back on Breo. Samples given today. Recommended pulmonary function tests and he declined. Tussionex for cough.      Relevant Medications   fluticasone furoate-vilanterol (BREO ELLIPTA) 100-25 MCG/INH AEPB     Meds ordered this encounter  Medications  .  chlorpheniramine-HYDROcodone (TUSSIONEX PENNKINETIC ER) 10-8 MG/5ML SUER    Sig: Take 5 mLs by mouth every 12 (twelve) hours as needed.    Dispense:  115 mL    Refill:  0  . fluticasone furoate-vilanterol (BREO ELLIPTA) 100-25 MCG/INH AEPB    Sig: Inhale 1 puff into the lungs at bedtime.    Dispense:  28 each    Refill:  0    Follow-up: 3 months  Marcos Ruelas Adriana Simas DO [facility]

## 2017-01-29 ENCOUNTER — Encounter: Payer: BLUE CROSS/BLUE SHIELD | Attending: Surgery | Admitting: Dietician

## 2017-01-29 ENCOUNTER — Encounter: Payer: Self-pay | Admitting: Dietician

## 2017-01-29 DIAGNOSIS — Z713 Dietary counseling and surveillance: Secondary | ICD-10-CM | POA: Insufficient documentation

## 2017-01-29 NOTE — Progress Notes (Signed)
Appt start time: 245 end time:  300  Supervised Weight Loss:   1st SWL Appointment.  Marco James returns for his first SWL appointment having lost 5 pounds. Having a gout flare in his hand and thinks this is due to alcohol consumption. Met with psychologist and had a good visit, felt like he learned a lot. Has been practicing chewing foods well. Blood sugars have been good lately. Replacing potatoes at breakfast with tomatoes and has cut back to only 1 piece toast. Marco James reports that he understands that he needs to start planning meals ahead of time.  Start Wt at [facility]: 391 lbs on 01/01/2017 Weight today: 386.3 lbs Weight change from last appt: 5 lbs loss  BMI: 53.88  Goal: Start thinking about planning meals and snacks ahead of time  Preferred Learning Style:   No preference indicated   Learning Readiness:  Ready  MEDICATIONS: see list   DIETARY INTAKE:  24-hr recall:  B ( AM): glass of whole milk with medicine B ( AM): 2 eggs, 1 sausage patty, 2 slices tomato, 1 slice toast  L ( PM): chef salad, sometimes skips Snk ( PM):  D ( PM): grilled salmon with rice and broccoli Snk ( PM):   Beverages: hot tea, whole milk, water, half sweet tea, occasional beer or liquor  Usual physical activity: did not assess  Diet to Follow: 2000-2200 calories 225-248 g carbohydrates 150-165 g protein 56-61 g fat   Nutritional Diagnosis:  [place]-3.3 Overweight/obesity related to past poor dietary habits and physical inactivity as evidenced by patient in SWL for pending bariatric surgery following dietary guidelines for continued weight loss.    Intervention:  Nutrition counseling for upcoming Bariatric Surgery.  Teaching Method Utilized:  Auditory  Handouts given during visit include:  none  Barriers to learning/adherence to lifestyle change: none  Demonstrated degree of understanding via:  Teach Back   Monitoring/Evaluation:  Dietary intake, exercise, and body weight in 4 week(s).

## 2017-01-29 NOTE — Patient Instructions (Signed)
-  Start thinking about planning meals and snacks ahead of time

## 2017-01-30 ENCOUNTER — Other Ambulatory Visit: Payer: Self-pay | Admitting: Radiology

## 2017-01-30 DIAGNOSIS — Z7901 Long term (current) use of anticoagulants: Principal | ICD-10-CM

## 2017-01-30 DIAGNOSIS — Z5181 Encounter for therapeutic drug level monitoring: Secondary | ICD-10-CM

## 2017-01-31 ENCOUNTER — Other Ambulatory Visit (INDEPENDENT_AMBULATORY_CARE_PROVIDER_SITE_OTHER): Payer: BLUE CROSS/BLUE SHIELD

## 2017-01-31 DIAGNOSIS — Z7901 Long term (current) use of anticoagulants: Secondary | ICD-10-CM | POA: Diagnosis not present

## 2017-01-31 DIAGNOSIS — Z5181 Encounter for therapeutic drug level monitoring: Secondary | ICD-10-CM | POA: Diagnosis not present

## 2017-01-31 LAB — PROTIME-INR
INR: 1.5 ratio — ABNORMAL HIGH (ref 0.8–1.0)
PROTHROMBIN TIME: 16.1 s — AB (ref 9.6–13.1)

## 2017-02-01 ENCOUNTER — Other Ambulatory Visit: Payer: Self-pay | Admitting: Family Medicine

## 2017-02-01 NOTE — Telephone Encounter (Signed)
Refilled by PA, Chrismon on 09/24/16. Pt last seen 01/22/17. Please advise?

## 2017-02-04 ENCOUNTER — Other Ambulatory Visit: Payer: Self-pay | Admitting: Family Medicine

## 2017-02-11 ENCOUNTER — Telehealth: Payer: Self-pay | Admitting: Family Medicine

## 2017-02-11 ENCOUNTER — Other Ambulatory Visit: Payer: Self-pay | Admitting: Family Medicine

## 2017-02-11 NOTE — Telephone Encounter (Signed)
Called patient to followup foot pain.  Patient states he is having pain that is preventing him from walking and has been on Naproxen for 7 years and he is "angry" he was taken off it without any telling him why.  He states that he went to a walk in clinic today and received a steroid injection to help with the pain.  He denies trying Tylenol or anything over the counter to help with the pain.  -Explained to patient he was taken off Naproxen due to risk of bleeding with warfarin and also based on his renal function. -Discussed using OTC Tylenol extra strength or Tylenol OTC extended release up to 4000 mg/day -Instructed him to call clinic if OTC tylenol did not help with the pain and Dr Adriana Simasook may trial Tramadol.  Hazle NordmannKelsy Combs, PharmD, BCPS [facility] PGY2 Pharmacy Resident [phone]

## 2017-02-11 NOTE — Telephone Encounter (Signed)
Pain where? He should not be on NSAID's due to kidney function. Tylenol is fine. We could try tramadol as well.

## 2017-02-11 NOTE — Telephone Encounter (Signed)
Pt was taken of naproxen.

## 2017-02-11 NOTE — Telephone Encounter (Signed)
Pt is having feet pain. Pt stated that Dr. Adriana Simasook has taken him off his medication. Pt is having a problem standing because of the pain. Please advise. [phone].

## 2017-02-12 ENCOUNTER — Telehealth: Payer: Self-pay | Admitting: Radiology

## 2017-02-12 ENCOUNTER — Other Ambulatory Visit: Payer: Self-pay | Admitting: Family Medicine

## 2017-02-12 DIAGNOSIS — I482 Chronic atrial fibrillation, unspecified: Secondary | ICD-10-CM

## 2017-02-12 NOTE — Telephone Encounter (Signed)
Pt coming in for labs tomorrow, please place future orders. Thank you.  

## 2017-02-13 ENCOUNTER — Other Ambulatory Visit: Payer: BLUE CROSS/BLUE SHIELD

## 2017-02-18 ENCOUNTER — Other Ambulatory Visit: Payer: Self-pay | Admitting: Family Medicine

## 2017-02-18 DIAGNOSIS — M109 Gout, unspecified: Secondary | ICD-10-CM

## 2017-02-24 ENCOUNTER — Other Ambulatory Visit: Payer: Self-pay | Admitting: Family Medicine

## 2017-02-24 DIAGNOSIS — M109 Gout, unspecified: Secondary | ICD-10-CM

## 2017-02-25 ENCOUNTER — Other Ambulatory Visit (INDEPENDENT_AMBULATORY_CARE_PROVIDER_SITE_OTHER): Payer: BLUE CROSS/BLUE SHIELD

## 2017-02-25 ENCOUNTER — Other Ambulatory Visit: Payer: Self-pay | Admitting: Radiology

## 2017-02-25 ENCOUNTER — Telehealth: Payer: Self-pay

## 2017-02-25 DIAGNOSIS — Z5181 Encounter for therapeutic drug level monitoring: Secondary | ICD-10-CM

## 2017-02-25 DIAGNOSIS — Z7901 Long term (current) use of anticoagulants: Secondary | ICD-10-CM

## 2017-02-25 LAB — PROTIME-INR
INR: 2.3 ratio — ABNORMAL HIGH (ref 0.8–1.0)
Prothrombin Time: 24.9 s — ABNORMAL HIGH (ref 9.6–13.1)

## 2017-02-25 NOTE — Telephone Encounter (Signed)
Patient walked into the office, c/o right foot, right knee, and left shoulder pain that has been progressively getting worse since he was taken off the Naproxen after his last appt on January/February. He states that he has a old shoulder injury and knee injury that are acting up and that he believes he has bone spurs in the right foot.  I explained after consult with PCP that he was taken off the Naproxen due to his renal status and that it is not recommended that he start back on it.  He was not happy with that response and feels like he is getting a run around regarding getting his care.  While talking to him he shared that he was not able to get his INR drawn last month, and that he has been so miserable that he went to a urgnet care and was given prednisone to take for the pain.  I suggested that we get the INR today and that we can do a referral for Orthopedic if he wanted to assist with other alternatives to his pain/inflammation.  He was agreeable to that.  He did state that he hasn't had a drink of alcohol in a month and that until something is done he will take the OTC naproxen as needed.   Please advise. thanks

## 2017-02-25 NOTE — Telephone Encounter (Signed)
Please advise for refill, was last done by previous provider.  That provider declined the refill yeaterday.  Please advise, thanks

## 2017-02-25 NOTE — Telephone Encounter (Signed)
There is no "run around" regarding his care. I received refill requests for a medication that he should not be taking. He can use tylenol for pain. No narcotics given history of substance abuse. I am happy for him to see someone regarding his pain.

## 2017-02-26 ENCOUNTER — Telehealth: Payer: Self-pay | Admitting: *Deleted

## 2017-02-26 NOTE — Telephone Encounter (Signed)
Patient requested lab/test results Pt contact   [phone]

## 2017-02-26 NOTE — Telephone Encounter (Addendum)
Left message to call.

## 2017-02-26 NOTE — Telephone Encounter (Signed)
Pt called back returning your call. Thank you!  Call pt @ [phone]

## 2017-02-26 NOTE — Telephone Encounter (Signed)
Noted thanks, patient informed of referral.

## 2017-02-26 NOTE — Telephone Encounter (Signed)
Patient advised of below and verbalized understanding.  

## 2017-02-26 NOTE — Telephone Encounter (Signed)
Left message to call.

## 2017-02-27 ENCOUNTER — Other Ambulatory Visit: Payer: Self-pay | Admitting: Family Medicine

## 2017-02-27 ENCOUNTER — Telehealth: Payer: Self-pay

## 2017-02-27 ENCOUNTER — Encounter: Payer: BLUE CROSS/BLUE SHIELD | Attending: Surgery | Admitting: Skilled Nursing Facility1

## 2017-02-27 ENCOUNTER — Telehealth: Payer: Self-pay | Admitting: Family Medicine

## 2017-02-27 ENCOUNTER — Encounter: Payer: Self-pay | Admitting: Skilled Nursing Facility1

## 2017-02-27 DIAGNOSIS — Z713 Dietary counseling and surveillance: Secondary | ICD-10-CM | POA: Insufficient documentation

## 2017-02-27 DIAGNOSIS — I482 Chronic atrial fibrillation, unspecified: Secondary | ICD-10-CM

## 2017-02-27 DIAGNOSIS — N183 Chronic kidney disease, stage 3 unspecified: Secondary | ICD-10-CM

## 2017-02-27 DIAGNOSIS — E1140 Type 2 diabetes mellitus with diabetic neuropathy, unspecified: Secondary | ICD-10-CM

## 2017-02-27 NOTE — Telephone Encounter (Signed)
Can you please call pt to schedule a lab appt for 1 month.

## 2017-02-27 NOTE — Telephone Encounter (Signed)
No referral in pts chart. Please advise?

## 2017-02-27 NOTE — Progress Notes (Signed)
Appt start time: 245 end time:  300  Supervised Weight Loss:   2nd SWL Appointment.  : having lost 7.5 pounds.   Pt is Agitated from physician not prescribing him his naproxen anymore Pt was taking prednisone which resulted in increased blood sugars. No alcohol since February 1st. Pt states he is in so much pain he is not sleeping well. Pt states he loves whole milk and will not change that habit. Pt states his blood sugar numbers are usually about 130: fasting 142.  Pt is talkative.  Start Wt at [facility]: 391 lbs on 01/01/2017 Weight today: 378.12.8 lbs  BMI: 52.83  Goal: Start thinking about planning meals and snacks ahead of time  Preferred Learning Style:   No preference indicated   Learning Readiness:  Ready  MEDICATIONS: see list   DIETARY INTAKE:  24-hr recall:  B ( AM): glass of whole milk with medicine B ( AM): 3 eggs, 2 sausage patty, 2 slices tomato, 1 slice toast  L ( PM): chef salad, sometimes skips Snk ( PM):  D ( PM): pho vietnamese  Snk ( PM):   Beverages: hot tea, whole milk, water, half sweet tea  Usual physical activity: In too much pain  Diet to Follow: 2000-2200 calories 225-248 g carbohydrates 150-165 g protein 56-61 g fat   Nutritional Diagnosis:  [place]-3.3 Overweight/obesity related to past poor dietary habits and physical inactivity as evidenced by patient in SWL for pending bariatric surgery following dietary guidelines for continued weight loss.    Intervention:  Nutrition counseling for upcoming Bariatric Surgery.  Teaching Method Utilized:  Auditory  Handouts given during visit include:  none  Barriers to learning/adherence to lifestyle change: none  Demonstrated degree of understanding via:  Teach Back   Monitoring/Evaluation:  Dietary intake, exercise, and body weight in 4 week(s).

## 2017-02-27 NOTE — Telephone Encounter (Signed)
-----   Message from Tommie SamsJayce G Cook, DO sent at 02/26/2017  4:48 PM EST ----- 1 month.

## 2017-02-27 NOTE — Telephone Encounter (Signed)
Pt asked about his orthopedic referral. Please advise, thank you!

## 2017-02-27 NOTE — Telephone Encounter (Signed)
Pt walked into office today and wanted to talk about his successes with his weight loss and upcoming bariatric surgery.  Pt very talkative.  States [facility] office, but [facility] and he would like to transfer to this office.  He states that he has had to recently go to a walk in clinic to get help for his recurring knee, shoulder and foot pain.  (prescribed prednisone)  He would prefer not want to take "pain pills".  Pt also requested a referral to orthopedic on 02/25/17 and has not heard from his PCP office.  Pt states his PCP "straight up won't talk to me and Morrie Sheldonshley has been very disrespectful to me".  Pt is requesting a transfer but does not want that transfer to interfere with his bariatric surgery.  Explained that our male doctors will not be back in the office until next week so if we get OK to transfer, I will contact him earliest 03/05/17 or 03/06/17.  Best number to contact pt is [phone].

## 2017-02-27 NOTE — Telephone Encounter (Signed)
Pt is scheduled for 1 month pt/inr.

## 2017-02-27 NOTE — Telephone Encounter (Signed)
I have not been involved in the care of the patient.  I am unable to accept him as a new patient at this point.

## 2017-02-28 ENCOUNTER — Other Ambulatory Visit: Payer: Self-pay | Admitting: Family Medicine

## 2017-02-28 DIAGNOSIS — M2550 Pain in unspecified joint: Secondary | ICD-10-CM

## 2017-02-28 NOTE — Telephone Encounter (Signed)
Okay to transfer.  FYI: Very difficult patient and has been rude to many staff members. We did not have any problems until his refill request for Naproxen was declined due to CKD. Regarding our communication, he has expected me to call him regarding my refusal of his medication. My CMA has spoken with him several times and explained to him the reason for not refilling.

## 2017-03-01 NOTE — Telephone Encounter (Signed)
I would be willing to accept but not rush an appt--- I am a bit overwhelmed right now

## 2017-03-06 NOTE — Telephone Encounter (Signed)
Agree with Dr Alphonsus SiasLetvak - could see but I think my new pt slots are out far and I'm just coming back from out of town.

## 2017-03-08 NOTE — Telephone Encounter (Signed)
Called pt to schedule appointment

## 2017-03-28 ENCOUNTER — Encounter: Payer: BLUE CROSS/BLUE SHIELD | Attending: Surgery | Admitting: Skilled Nursing Facility1

## 2017-03-28 ENCOUNTER — Encounter: Payer: Self-pay | Admitting: Skilled Nursing Facility1

## 2017-03-28 DIAGNOSIS — Z713 Dietary counseling and surveillance: Secondary | ICD-10-CM | POA: Insufficient documentation

## 2017-03-28 DIAGNOSIS — E1140 Type 2 diabetes mellitus with diabetic neuropathy, unspecified: Secondary | ICD-10-CM

## 2017-03-28 NOTE — Progress Notes (Signed)
Appt start time: 245 end time:  300  Supervised Weight Loss:   3rd SWL Appointment.  : having lost 16 pounds.   Pt arrives having lost 16 pounds. Pt has excited to switch to 2% milk from whole milk. Ate chicken at [employer], no gravy, no rice, no alcohol, and it caused gout in his right hand. Pt states that he eats a lot of peanut butter. Boost, with boiled egg,  Grazing throughout the day. Mom is in assisted living home. Darl Pikes mothers him with pecan pie and other pies. Very interested in eating soup after surgery. Loves soup. Dad used to make "superman" soup while growing up (lentils in red suauce and rice).  Doesn't like cooking for one person.  Pt states his harley is in the shop due to a Battery issue.   Start Wt at [facility]: 391 lbs on 01/01/2017 Weight today: 362.11.2 lbs  BMI: 50.59  Goal: keep working on your great changes: was given a high purin food list  Preferred Learning Style:   No preference indicated   Learning Readiness:  Ready  MEDICATIONS: see list   DIETARY INTAKE:  24-hr recall:  B ( AM): glass of whole milk with medicine B ( AM): 2 eggs, 1 sausage patty, 2 slices tomato, 1 whole wheat slice toast  L ( PM): chef salad, sometimes skips Snk ( PM):  D ( PM): pho vietnamese, hot dog, peanut butter sandwich  Snk ( PM):   Beverages: hot tea, 2% milk, water, half sweet tea  Usual physical activity: In too much pain  Diet to Follow: 2000-2200 calories 225-248 g carbohydrates 150-165 g protein 56-61 g fat   Nutritional Diagnosis:  -3.3 Overweight/obesity related to past poor dietary habits and physical inactivity as evidenced by patient in SWL for pending bariatric surgery following dietary guidelines for continued weight loss.    Intervention:  Nutrition counseling for upcoming Bariatric Surgery.  Teaching Method Utilized:  Auditory  Handouts given during visit include:  none  Barriers to learning/adherence to lifestyle change:  none  Demonstrated degree of understanding via:  Teach Back   Monitoring/Evaluation:  Dietary intake, exercise, and body weight in 4 week(s).

## 2017-04-01 ENCOUNTER — Other Ambulatory Visit: Payer: BLUE CROSS/BLUE SHIELD

## 2017-04-08 ENCOUNTER — Ambulatory Visit (INDEPENDENT_AMBULATORY_CARE_PROVIDER_SITE_OTHER): Payer: BLUE CROSS/BLUE SHIELD | Admitting: Internal Medicine

## 2017-04-08 ENCOUNTER — Encounter: Payer: Self-pay | Admitting: Internal Medicine

## 2017-04-08 ENCOUNTER — Ambulatory Visit (INDEPENDENT_AMBULATORY_CARE_PROVIDER_SITE_OTHER): Payer: BLUE CROSS/BLUE SHIELD

## 2017-04-08 VITALS — BP 122/76 | HR 69 | Temp 97.9°F | Resp 20 | Ht 71.75 in | Wt 387.0 lb

## 2017-04-08 DIAGNOSIS — R7989 Other specified abnormal findings of blood chemistry: Secondary | ICD-10-CM | POA: Diagnosis not present

## 2017-04-08 DIAGNOSIS — N183 Chronic kidney disease, stage 3 unspecified: Secondary | ICD-10-CM

## 2017-04-08 DIAGNOSIS — I482 Chronic atrial fibrillation, unspecified: Secondary | ICD-10-CM

## 2017-04-08 DIAGNOSIS — E1140 Type 2 diabetes mellitus with diabetic neuropathy, unspecified: Secondary | ICD-10-CM

## 2017-04-08 DIAGNOSIS — I5032 Chronic diastolic (congestive) heart failure: Secondary | ICD-10-CM

## 2017-04-08 DIAGNOSIS — Z8639 Personal history of other endocrine, nutritional and metabolic disease: Secondary | ICD-10-CM

## 2017-04-08 DIAGNOSIS — Z5181 Encounter for therapeutic drug level monitoring: Secondary | ICD-10-CM

## 2017-04-08 LAB — POCT INR: INR: 2.3

## 2017-04-08 NOTE — Assessment & Plan Note (Signed)
Reasonably compensated Can't weigh but adjusts diuretics with SOB

## 2017-04-08 NOTE — Assessment & Plan Note (Signed)
Not many options for his pain relief Asked him to cut back on aleve and not take more that the bare minimum

## 2017-04-08 NOTE — Progress Notes (Signed)
Subjective:    Patient ID: Marco James [facility], male    DOB: 1956-06-30, 61 y.o.   MRN: [id number]  HPI Here to establish with another doctor Thought Dr Adriana Simas didn't talk to him enough  Lots of aches and pains from his obesity Upset that he told him to stop the naproxen---due to renal issues Actually couldn't walk--- went to Walk in clinic Got shot of prednisone and 6 day supply Has gone back to naproxen OTC--- 5 per day This works pretty well Tried tylenol ---didn't help  Being evaluated by surgeon for sleeve gastrostomy for weight loss Dr Daphine Deutscher at [facility]  Has had diabetes for many years On only glipizide He checks daily Ongoing neuropathy--- numbness and sometimes gets pain  History of atrial fibrillation--but no palpitations Stable edema Hasn't been weighing  No chest pain Gets SOB if "fluid levels go up"--- SOB is the key since he can't weigh at home Has tried compression hose--- slight help but nothing much  Former cocaine user Only snorted Decades ago  Current Outpatient Prescriptions on File Prior to Visit  Medication Sig Dispense Refill  . allopurinol (ZYLOPRIM) 100 MG tablet take 1 tablet by mouth once daily 30 tablet 3  . chlorpheniramine-HYDROcodone (TUSSIONEX PENNKINETIC ER) 10-8 MG/5ML SUER Take 5 mLs by mouth every 12 (twelve) hours as needed. 115 mL 0  . doxazosin (CARDURA) 8 MG tablet Take 1 tablet (8 mg total) by mouth 2 (two) times daily. (Patient taking differently: Take 8 mg by mouth daily. ) 180 tablet 3  . furosemide (LASIX) 80 MG tablet take 1 tablet by mouth once daily 90 tablet 0  . glipiZIDE (GLUCOTROL) 10 MG tablet Take 1 tablet (10 mg total) by mouth 2 (two) times daily before a meal. (Patient taking differently: Take 10 mg by mouth daily. ) 60 tablet 3  . lisinopril (PRINIVIL,ZESTRIL) 40 MG tablet Take 1 tablet (40 mg total) by mouth daily. 90 tablet 3  . metolazone (ZAROXOLYN) 2.5 MG tablet take 1 tablet by mouth once daily if needed 30 tablet 3  .  metoprolol (LOPRESSOR) 100 MG tablet Take 1 tablet (100 mg total) by mouth 2 (two) times daily. (Patient taking differently: Take 200 mg by mouth daily. ) 180 tablet 3  . NON FORMULARY CPAP    . potassium chloride SA (K-DUR,KLOR-CON) 20 MEQ tablet Take 1 tablet (20 mEq total) by mouth 3 (three) times daily. 90 tablet 6  . warfarin (COUMADIN) 6 MG tablet Take 1 tablet (6 mg total) by mouth daily. 30 tablet 6  . fluticasone furoate-vilanterol (BREO ELLIPTA) 100-25 MCG/INH AEPB Inhale 1 puff into the lungs at bedtime. (Patient not taking: Reported on 04/08/2017) 28 each 0   No current facility-administered medications on file prior to visit.     No Known Allergies  Past Medical History:  Diagnosis Date  . A-fib (HCC)   . Cellulitis and abscess of leg 2009  . CHF (congestive heart failure) (HCC)   . Dupuytren's disease   . External hemorrhoids   . HTN (hypertension)   . Hyperlipidemia   . Hyperuricemia   . Obstructive sleep apnea   . Pneumonia   . Polysubstance abuse   . Toenail fungus   . Type 2 diabetes mellitus (HCC)     Past Surgical History:  Procedure Laterality Date  . COLONOSCOPY WITH PROPOFOL N/A 10/25/2015   Procedure: COLONOSCOPY WITH PROPOFOL;  Surgeon: Midge Minium, MD;  Location: [facility] ENDOSCOPY;  Service: Endoscopy;  Laterality: N/A;  . NASAL  SINUS SURGERY    . WISDOM TOOTH EXTRACTION      Family History  Problem Relation Age of Onset  . Heart disease Father 30    Social History   Social History  . Marital status: Divorced    Spouse name: N/A  . Number of children: 1  . Years of education: N/A   Occupational History  . Toolmaker     self employed   Social History Main Topics  . Smoking status: Former Smoker    Packs/day: 0.50    Years: 25.00    Types: Cigarettes  . Smokeless tobacco: Never Used  . Alcohol use Yes     Comment: just occasional  . Drug use: No     Comment: Pt. used cocaine for 3 years, marijuana--long ago  . Sexual activity: Not on  file   Other Topics Concern  . Not on file   Social History Narrative   1 son in [place]   Review of Systems Weight is up now Sleeps in bed--- flat. Wears CPAP. No PND Nocturia x 1 generally Occasional blood on toilet paper--?hemorrhoid    Objective:   Physical Exam  Constitutional: No distress.  Neck: No thyromegaly present.  Cardiovascular: Normal rate.  Exam reveals no gallop.   No murmur heard. Irregular Pedal pulses absent  Pulmonary/Chest: Effort normal and breath sounds normal. No respiratory distress. He has no wheezes. He has no rales.  Musculoskeletal:  1+ edema  Lymphadenopathy:    He has no cervical adenopathy.  Skin:  No foot ulcers--dry skin Mycotic toenails  Psychiatric: He has a normal mood and affect. His behavior is normal.          Assessment & Plan:

## 2017-04-08 NOTE — Assessment & Plan Note (Signed)
Rate is controlled On the coumadin 

## 2017-04-08 NOTE — Patient Instructions (Signed)
Pre visit review using our clinic review tool, if applicable. No additional management support is needed unless otherwise documented below in the visit note. 

## 2017-04-08 NOTE — Assessment & Plan Note (Signed)
Lab Results  Component Value Date   HGBA1C 6.5 12/28/2016   Good control fortunately

## 2017-04-09 LAB — RENAL FUNCTION PANEL
ALBUMIN: 4.3 g/dL (ref 3.5–5.2)
BUN: 39 mg/dL — AB (ref 6–23)
CO2: 37 mEq/L — ABNORMAL HIGH (ref 19–32)
Calcium: 9.6 mg/dL (ref 8.4–10.5)
Chloride: 95 mEq/L — ABNORMAL LOW (ref 96–112)
Creatinine, Ser: 1.62 mg/dL — ABNORMAL HIGH (ref 0.40–1.50)
GFR: 46.51 mL/min — ABNORMAL LOW (ref >60.00)
GLUCOSE: 78 mg/dL (ref 70–99)
POTASSIUM: 3.3 meq/L — AB (ref 3.5–5.1)
Phosphorus: 3.8 mg/dL (ref 2.3–4.6)
SODIUM: 140 meq/L (ref 135–145)

## 2017-04-09 LAB — LIPID PANEL
Cholesterol: 181 mg/dL (ref 0–200)
HDL: 27.3 mg/dL — AB (ref >39.00)
NONHDL: 154.08
Total CHOL/HDL Ratio: 7
Triglycerides: 274 mg/dL — ABNORMAL HIGH (ref 0.0–149.0)
VLDL: 54.8 mg/dL — AB (ref 0.0–40.0)

## 2017-04-09 LAB — HEMOGLOBIN A1C: Hgb A1c MFr Bld: 6.3 % (ref 4.6–6.5)

## 2017-04-09 LAB — LDL CHOLESTEROL, DIRECT: LDL DIRECT: 101 mg/dL

## 2017-04-11 MED ORDER — POTASSIUM CHLORIDE CRYS ER 20 MEQ PO TBCR: 40.0000 meq | EXTENDED_RELEASE_TABLET | Freq: Two times a day (BID) | ORAL | 1 refills | Status: DC

## 2017-04-11 NOTE — Addendum Note (Signed)
Addended by: Desmond Dike on: 04/11/2017 09:28 AM   Modules accepted: Orders

## 2017-04-15 ENCOUNTER — Encounter: Payer: Self-pay | Admitting: Podiatry

## 2017-04-15 ENCOUNTER — Ambulatory Visit (INDEPENDENT_AMBULATORY_CARE_PROVIDER_SITE_OTHER): Payer: BLUE CROSS/BLUE SHIELD | Admitting: Podiatry

## 2017-04-15 DIAGNOSIS — B351 Tinea unguium: Secondary | ICD-10-CM

## 2017-04-15 DIAGNOSIS — M7662 Achilles tendinitis, left leg: Secondary | ICD-10-CM

## 2017-04-15 DIAGNOSIS — M79609 Pain in unspecified limb: Secondary | ICD-10-CM | POA: Diagnosis not present

## 2017-04-15 MED ORDER — MELOXICAM 15 MG PO TABS: 15.0000 mg | ORAL_TABLET | Freq: Every day | ORAL | 3 refills | Status: DC

## 2017-04-15 NOTE — Progress Notes (Signed)
This patient presents the office for continued evaluation and treatment of his long thick painful nails. He states the nails are painful walking and wearing his shoes. Patient is diabetic and is unable to self treat. He also relates having pain noted on the back of his left heel. This is painful walking and wearing his shoes. He presents the office today for preventative foot care services as well as treatment for his painful left heel    VASCULAR: Pedal pulses are  palpable at  [facility] and PT bilateral.  Capillary refill time is immediate to all digits,  Normal temperature gradient.   NEUROLOGIC: sensation is diminished  to 5.07 monofilament at 5/5 sites bilateral.  Light touch is intact bilateral, Muscle strength normal.  MUSCULOSKELETAL: acceptable muscle strength, tone and stability bilateral.  Intrinsic muscluature intact bilateral.  Rectus appearance of foot and digits noted bilateral. Pain at the insertion achilles tendon left foot.  DERMATOLOGIC: skin color, texture, and turgor are within normal limits.  No preulcerative lesions or ulcers  are seen, no interdigital maceration noted.  No open lesions present.   No drainage noted.  NAILS  Thick disfigured discolored nails both feet.    .Onychomycosis  B/L  Achilles tendinitis left   ROV.  Debridement and grinding of nails  Prescribe Mobic for achilles tendinitis  RTC for heel evaluation by Al Corpus or [facility].  RTC 3 months for preventive footcare services.   Helane Gunther DPM

## 2017-04-17 ENCOUNTER — Other Ambulatory Visit: Payer: Self-pay | Admitting: Family Medicine

## 2017-04-18 ENCOUNTER — Telehealth: Payer: Self-pay

## 2017-04-18 MED ORDER — WARFARIN SODIUM 6 MG PO TABS: 6.0000 mg | ORAL_TABLET | Freq: Every day | ORAL | 6 refills | Status: DC

## 2017-04-18 NOTE — Telephone Encounter (Signed)
Rx sent electronically.  

## 2017-04-23 ENCOUNTER — Other Ambulatory Visit: Payer: Self-pay | Admitting: Family Medicine

## 2017-04-23 DIAGNOSIS — I5030 Unspecified diastolic (congestive) heart failure: Secondary | ICD-10-CM

## 2017-04-24 ENCOUNTER — Other Ambulatory Visit: Payer: Self-pay

## 2017-04-24 MED ORDER — FUROSEMIDE 80 MG PO TABS: 80.0000 mg | ORAL_TABLET | Freq: Every day | ORAL | 3 refills | Status: DC

## 2017-04-24 NOTE — Telephone Encounter (Signed)
I sent in the furosemide refill for 1 year and corrected the metolazone. Left a message on vm for pt.

## 2017-04-24 NOTE — Telephone Encounter (Signed)
Okay furosemide for a year Please update the metolazone to show how he is taking it

## 2017-04-24 NOTE — Telephone Encounter (Signed)
Pt is going out of town on 04/25/17; pt is going to be out of furosemide. Pt established care on 04/08/17. Pt request refill furosemide 80 mg taking one tab daily to [address]Dr Alphonsus Sias has not refilled yet; last refill # 90 on 01/18/17 by Dr Shella Spearing.  Pt does not need refill but wanted Dr Alphonsus Sias to be aware pt taking metolazone 2.5 mg daily (not prn as on med list). Please advise.

## 2017-04-30 ENCOUNTER — Ambulatory Visit (INDEPENDENT_AMBULATORY_CARE_PROVIDER_SITE_OTHER): Payer: BLUE CROSS/BLUE SHIELD | Admitting: Podiatry

## 2017-04-30 ENCOUNTER — Encounter: Payer: Self-pay | Admitting: Podiatry

## 2017-04-30 DIAGNOSIS — M7662 Achilles tendinitis, left leg: Secondary | ICD-10-CM

## 2017-04-30 DIAGNOSIS — M7752 Other enthesopathy of left foot: Secondary | ICD-10-CM | POA: Diagnosis not present

## 2017-05-02 ENCOUNTER — Encounter: Payer: Self-pay | Admitting: Registered"

## 2017-05-02 ENCOUNTER — Encounter: Payer: BLUE CROSS/BLUE SHIELD | Attending: Surgery | Admitting: Registered"

## 2017-05-02 DIAGNOSIS — Z713 Dietary counseling and surveillance: Secondary | ICD-10-CM | POA: Diagnosis not present

## 2017-05-02 DIAGNOSIS — E669 Obesity, unspecified: Secondary | ICD-10-CM

## 2017-05-02 NOTE — Progress Notes (Signed)
Appt start time: 3:35 end time: 3:55   Supervised Weight Loss: 4th SW[facility] Appointment.   Start Wt at [facility]: 391 lbs on 01/01/2017 Weight today: 384.4 lbs BMI: 52.50   Pt arrives having gained 2 lbs from previous visit, using corrected weight of 382.11. Error-there was a typo and last weight should have been 382.11 instead of 362.11. Doesn't like cooking for one person. Pt states his son got married last weekend at the beach and he accidentally mixed up his medications while driving back. He overlooked 2 pills including his diuretic which caused shortness of breath until he took it at 2am. Pt reports he felt bad Monday because of his medication mix-up but felt awesome yesterday (Wednesday). Pt reports his FBS (102-105).    Goal: keep working on your great changes  Preferred Learning Style:   No preference indicated   Learning Readiness:  Ready  MEDICATIONS: see list   DIETARY INTAKE:  24-hr recall:  B ( AM): 2 eggs, 1 sausage patty, 2 slices tomato, 1 whole wheat slice toast L ( PM): chef salad, sometimes skips Snk ( PM): none D ( PM): pho vietnamese, hot dog, peanut butter sandwich  Snk ( PM): none  Beverages: hot tea, skim milk, 2% milk, water, half sweet tea  Usual physical activity: In too much pain  Diet to Follow: 2000-2200 calories 225-248 g carbohydrates 150-165 g protein 56-61 g fat   Nutritional Diagnosis:  [place]-3.3 Overweight/obesity related to past poor dietary habits and physical inactivity as evidenced by patient in [facility] for pending bariatric surgery following dietary guidelines for continued weight loss.    Intervention:  Nutrition counseling for upcoming Bariatric Surgery.  Goals:  - Increase physical activity with arm exercises - Find a liquid protein source that you enjoy with at least 15g or more of protein or 5g or less of carbohydrates for evenings  Teaching Method Utilized:  Auditory  Handouts given during visit include:  Arm Chair  exercises  Barriers to learning/adherence to lifestyle change: none  Demonstrated degree of understanding via:  Teach Back   Monitoring/Evaluation:  Dietary intake, exercise, and body weight in 4 week(s).

## 2017-05-02 NOTE — Patient Instructions (Addendum)
-   Increase physical activity with arm exercises  - Find a liquid protein source that you enjoy with at least 15g or more of protein or 5g or less of carbohydrates for evenings

## 2017-05-02 NOTE — Progress Notes (Addendum)
   HPI: Patient is a 61 year old male, referred by Dr. Stacie AcresMayer for achilles tendinitis. Pt states the pain has been present for the past 3-4 weeks. He states the pain is improved since his previous visit. Putting his shoes on and removing the aggravate the pain. He has been taking Meloxicam which has helped alleviate his symptoms.    Physical Exam: General: The patient is alert and oriented x3 in no acute distress.  Dermatology: Skin is warm, dry and supple bilateral lower extremities. Negative for open lesions or macerations.  Vascular: Palpable pedal pulses bilaterally. No edema or erythema noted. Capillary refill within normal limits.  Neurological: Epicritic and protective threshold grossly intact bilaterally.   Musculoskeletal Exam: Pain on palpation noted directly to the posterior calcaneal tubercle left foot. Pain with forced plantarflexion of the ankle joint.  Assessment: 1. Retrocalcaneal bursitis-left 2. Achilles tendinitis-left   Plan of Care:  1. Patient was evaluated. 2. Injection of 0.5 mLs Celestone Soluspan given into the insertion of the left achilles tendon. Care was taken to avoid direct injection into the tendon.  3. Return to clinic in 4 weeks.   Felecia ShellingBrent M. Mashal Slavick, DPM [facility]  Dr. Felecia ShellingBrent M. Nereida Schepp, DPM    [address]                                        [place], [place] [address]                Office [phone]  Fax [phone]

## 2017-05-04 NOTE — Progress Notes (Deleted)
Cardiology Office Note  Date:  05/04/2017   ID:  Marco James, DOB May 21, 1956, MRN [id number]  PCP:  Marco James, Marco I, MD   No chief complaint on file.   HPI:  [name] is a 61 year old gentleman with  morbid obesity,   hypertension,  diabetes type 2,  chronic lower extremity swelling,  chronic atrial fibrillation  Chronic diastolic CHF who follows up for his atrial fibrillation  He has expressed interest in gastric bypass surgery, Continues on Lasix 80 mg daily with metolazone 2.5 mg daily High fluid intake daily, occasional beers  Reports one episode in June 2017 was dehydrated, lightheaded Had blood work around that time, creatinine up to 1.6  Labs reviewed: HBA1C 7.7 Creatinine 1.6, BUN 31 05/29/2016: (was dehydrated on his bike) Baseline Cr 1.4  3 to 4 weeks ago, ran out of lasix Was SOB after several days, refilled his medications, feels like he is back to his baseline. He does not check his weight Quit smoking last year  EKG on today's visit shows atrial fibrillation with ventricular rate 62 bpm  In follow-up today, he reports that his blood pressure has been relatively well-controlled, continues to be bothered by lower extremity edema He has obstructive sleep apnea, uses CPAP, but has not seen a sleep physician in 12 years, no recent sleep study  Review of lab work shows hemoglobin A1c of 6, total cholesterol 170 Recent diabetes medication was held as numbers were doing well Denies any chest pain concerning for angina Tolerating warfarin though for Medicare has not checked this on regular basis.. Last INR was August 2016. Reports that he only checks this every 6 months or so He takes Lasix daily with metolazone, takes potassium 40 mEq daily   Other past medical history  taking Lasix 80 mg in the morning with metolazone 2.5 mg daily.    uncertain if he has had prior workup with previous cardiologist including echocardiogram. He denies having a stress  test   PMH:   has a past medical history of A-fib (HCC); Cellulitis and abscess of leg (2009); CHF (congestive heart failure) (HCC); Dupuytren's disease; External hemorrhoids; HTN (hypertension); Hyperlipidemia; Hyperuricemia; Obstructive sleep apnea; Pneumonia; Polysubstance abuse; Toenail fungus; and Type 2 diabetes mellitus (HCC).  PSH:    Past Surgical History:  Procedure Laterality Date  . COLONOSCOPY WITH PROPOFOL N/A 10/25/2015   Procedure: COLONOSCOPY WITH PROPOFOL;  Surgeon: Marco Miniumarren Wohl, MD;  Location: [facility] ENDOSCOPY;  Service: Endoscopy;  Laterality: N/A;  . NASAL SINUS SURGERY    . WISDOM TOOTH EXTRACTION      Current Outpatient Prescriptions  Medication Sig Dispense Refill  . allopurinol (ZYLOPRIM) 100 MG tablet take 1 tablet by mouth once daily 30 tablet 3  . chlorpheniramine-HYDROcodone (TUSSIONEX PENNKINETIC ER) 10-8 MG/5ML SUER Take 5 mLs by mouth every 12 (twelve) hours as needed. 115 mL 0  . doxazosin (CARDURA) 8 MG tablet Take 1 tablet (8 mg total) by mouth 2 (two) times daily. (Patient taking differently: Take 8 mg by mouth daily. ) 180 tablet 3  . fluticasone furoate-vilanterol (BREO ELLIPTA) 100-25 MCG/INH AEPB Inhale 1 puff into the lungs at bedtime. (Patient not taking: Reported on 04/08/2017) 28 each 0  . furosemide (LASIX) 80 MG tablet Take 1 tablet (80 mg total) by mouth daily. 90 tablet 3  . glipiZIDE (GLUCOTROL) 10 MG tablet Take 1 tablet (10 mg total) by mouth 2 (two) times daily before a meal. (Patient taking differently: Take 10 mg by mouth daily. ) 60  tablet 3  . lisinopril (PRINIVIL,ZESTRIL) 40 MG tablet Take 1 tablet (40 mg total) by mouth daily. 90 tablet 3  . meloxicam (MOBIC) 15 MG tablet Take 1 tablet (15 mg total) by mouth daily. 30 tablet 3  . metolazone (ZAROXOLYN) 2.5 MG tablet take 1 tablet by mouth once daily if needed (Patient taking differently: take 1 tablet by mouth once daily) 30 tablet 3  . metoprolol (LOPRESSOR) 100 MG tablet Take 1 tablet  (100 mg total) by mouth 2 (two) times daily. (Patient taking differently: Take 200 mg by mouth daily. ) 180 tablet 3  . NAPROXEN SODIUM PO Take 1,000 mg by mouth daily.    . NON FORMULARY CPAP    . potassium chloride SA (K-DUR,KLOR-CON) 20 MEQ tablet Take 2 tablets (40 mEq total) by mouth 2 (two) times daily. 360 tablet 1  . warfarin (COUMADIN) 6 MG tablet Take 1 tablet (6 mg total) by mouth daily. 30 tablet 6   No current facility-administered medications for this visit.      Allergies:   Patient has no known allergies.   Social History:  The patient  reports that he has quit smoking. His smoking use included Cigarettes. He has a 12.50 pack-year smoking history. He has never used smokeless tobacco. He reports that he drinks alcohol. He reports that he does not use drugs.   Family History:   family history includes Heart disease (age of onset: 48) in his father.    Review of Systems: Review of Systems  Constitutional: Negative.   Respiratory: Negative.   Cardiovascular: Negative.   Gastrointestinal: Negative.   Musculoskeletal: Negative.   Neurological: Negative.   Psychiatric/Behavioral: Negative.   All other systems reviewed and are negative.    PHYSICAL EXAM: VS:  There were no vitals taken for this visit. , BMI There is no height or weight on file to calculate BMI.  Repeat blood pressure 160 systolic over 100 GEN: Well nourished, well developed, in no acute distress , obese HEENT: normal  Neck: no JVD, carotid bruits, or masses Cardiac: RRR; no murmurs, rubs, or gallops,no edema  Respiratory:  clear to auscultation bilaterally, normal work of breathing GI: soft, nontender, nondistended, + BS MS: no deformity or atrophy  Skin: warm and dry, no rash Neuro:  Strength and sensation are intact Psych: euthymic mood, full affect    Recent Labs: 05/29/2016: ALT 17; TSH 2.290 08/29/2016: Platelets 137 04/08/2017: BUN 39; Creatinine, Ser 1.62; Potassium 3.3; Sodium 140     Lipid Panel Lab Results  Component Value Date   CHOL 181 04/08/2017   HDL 27.30 (L) 04/08/2017   LDLCALC 137 (H) 05/22/2014   TRIG 274.0 (H) 04/08/2017      Wt Readings from Last 3 Encounters:  05/02/17 (!) 384 lb 6.4 oz (174.4 kg)  04/08/17 (!) 387 lb (175.5 kg)  03/28/17 (!) 362 lb 11.2 oz (164.5 kg)       ASSESSMENT AND PLAN:  Diastolic CHF, chronic (HCC) For now would stay on Lasix 80 mg daily with metolazone as needed. He prefers to take metolazone every day Recommended he increase his potassium, add extra pill 3 days per week  Chronic atrial fibrillation (HCC) Rate well controlled,  Warfarin managed by primary care. Previously managed by Dr. Sherrie Mustache He prefers not to have this checked on a regular basis  Morbid obesity [facility]) Long discussion concerning options for weight loss. He is interested in bariatric surgery, about to start paperwork Would likely be acceptable surgical risk weight  loss would be beneficial  Essential (primary) hypertension Blood pressure is well controlled on today's visit. No changes made to the medications.  Pure hypercholesterolemia Reports that he does not have a cholesterol problem No recent lipids in our system. No known coronary disease Given his diabetes, goal LDL should be less than 100  Bilateral lower extremity edema Stable chronic leg edema, recommended compression hose  Type 2 diabetes mellitus with diabetic neuropathy, unspecified long term insulin use status (HCC) Hemoglobin A1c 7.7. Recommended strict diet, low carbohydrates  CKD (chronic kidney disease) stage 3, GFR 30-59 ml/min Baseline creatinine 1.3, 1.4 with diuretics, 1.6 with overdiuresis   Total encounter time more than 25 minutes  Greater than 50% was spent in counseling and coordination of care with the patient   Disposition:   F/U  6 months  No orders of the defined types were placed in this encounter.    Signed, Dossie Arbour, M.D.,  Ph.D. 05/04/2017  [facility] [place], [place] [phone]

## 2017-05-06 ENCOUNTER — Encounter: Payer: Self-pay | Admitting: Cardiovascular Disease

## 2017-05-06 ENCOUNTER — Ambulatory Visit: Payer: BLUE CROSS/BLUE SHIELD | Admitting: Cardiovascular Disease

## 2017-05-08 NOTE — Progress Notes (Signed)
Cardiology Office Note  Date:  05/09/2017   ID:  LIEV BROCKBANK, DOB December 30, 1955, MRN [id number]  PCP:  Karie Schwalbe, MD   Chief Complaint  Patient presents with  . other    6 month follow up. Meds reviewed by the pt. verbally. Pt. c/o LE edema & has shortness of breath with breaking out into a sweat last Thursday.     HPI:  Mr. Tejada is a 61 year old gentleman with  morbid obesity,   hypertension,  diabetes type 2,  Prior smoking history, stopped 2016 chronic lower extremity swelling, On high-dose diuretics chronic atrial fibrillation  OSA on CPAP who follows up for his atrial fibrillation  Reports having recent episodes of severe sweating Sweating epsiode overnight one night, woke up soaking, etiology unclear  Happened again during the day He did monitor her blood pressure and heart rate when he had these episodes  Pressures up to 170 that time , Heart rate minimally changed  Did not check his sugar level  Chronically low potassium, doses recently increased up to 40 mEq twice a day He takes metolazone 2.5 mg daily  Lab work reviewed with him in detail  LDL 101 HBa1C 6.3 Total chol 181 Potassium 3.3 CR 1.6 INR checked through primary care,  last in March 2018  He has expressed interest in gastric bypass surgery,  previously reported having High fluid intake daily, occasional beers  EKG  personally reviewed on today's visit shows atrial fibrillation with ventricular rate 62 bpm  Other past medical history  taking Lasix 80 mg in the morning with metolazone 2.5 mg daily.    uncertain if he has had prior workup with previous cardiologist including echocardiogram.  He denies having a stress test   PMH:   has a past medical history of A-fib (HCC); Cellulitis and abscess of leg (2009); CHF (congestive heart failure) (HCC); Dupuytren's disease; External hemorrhoids; HTN (hypertension); Hyperlipidemia; Hyperuricemia; Obstructive sleep apnea; Pneumonia;  Polysubstance abuse; Toenail fungus; and Type 2 diabetes mellitus (HCC).  PSH:    Past Surgical History:  Procedure Laterality Date  . COLONOSCOPY WITH PROPOFOL N/A 10/25/2015   Procedure: COLONOSCOPY WITH PROPOFOL;  Surgeon: Midge Minium, MD;  Location: [facility] ENDOSCOPY;  Service: Endoscopy;  Laterality: N/A;  . NASAL SINUS SURGERY    . WISDOM TOOTH EXTRACTION      Current Outpatient Prescriptions  Medication Sig Dispense Refill  . allopurinol (ZYLOPRIM) 100 MG tablet take 1 tablet by mouth once daily 30 tablet 3  . chlorpheniramine-HYDROcodone (TUSSIONEX PENNKINETIC ER) 10-8 MG/5ML SUER Take 5 mLs by mouth every 12 (twelve) hours as needed. 115 mL 0  . doxazosin (CARDURA) 8 MG tablet Take 1 tablet (8 mg total) by mouth 2 (two) times daily. (Patient taking differently: Take 8 mg by mouth daily. ) 180 tablet 3  . fluticasone furoate-vilanterol (BREO ELLIPTA) 100-25 MCG/INH AEPB Inhale 1 puff into the lungs at bedtime. 28 each 0  . furosemide (LASIX) 80 MG tablet Take 1 tablet (80 mg total) by mouth daily. 90 tablet 3  . glipiZIDE (GLUCOTROL) 10 MG tablet Take 1 tablet (10 mg total) by mouth 2 (two) times daily before a meal. (Patient taking differently: Take 10 mg by mouth daily. ) 60 tablet 3  . lisinopril (PRINIVIL,ZESTRIL) 40 MG tablet Take 1 tablet (40 mg total) by mouth daily. 90 tablet 3  . meloxicam (MOBIC) 15 MG tablet Take 1 tablet (15 mg total) by mouth daily. 30 tablet 3  . metoprolol (LOPRESSOR) 100 MG  tablet Take 1 tablet (100 mg total) by mouth 2 (two) times daily. (Patient taking differently: Take 200 mg by mouth daily. ) 180 tablet 3  . NAPROXEN SODIUM PO Take 1,000 mg by mouth daily.    . NON FORMULARY CPAP    . potassium chloride SA (K-DUR,KLOR-CON) 20 MEQ tablet Take 2 tablets (40 mEq total) by mouth 2 (two) times daily. 360 tablet 1  . warfarin (COUMADIN) 6 MG tablet Take 1 tablet (6 mg total) by mouth daily. 30 tablet 6  . metolazone (ZAROXOLYN) 5 MG tablet Take 1 tablet (5  mg total) by mouth as directed. Take 1/2 to 1 tablet as needed once daily 30 tablet 6   No current facility-administered medications for this visit.      Allergies:   Patient has no known allergies.   Social History:  The patient  reports that he has quit smoking. His smoking use included Cigarettes. He has a 12.50 pack-year smoking history. He has never used smokeless tobacco. He reports that he drinks alcohol. He reports that he does not use drugs.   Family History:   family history includes Heart disease (age of onset: 5877) in his father.    Review of Systems: Review of Systems  Constitutional: Negative.        2 episodes of sweats  Cardiovascular: Positive for leg swelling.  Gastrointestinal: Negative.   Musculoskeletal: Negative.   Neurological: Negative.   Psychiatric/Behavioral: Negative.   All other systems reviewed and are negative.    PHYSICAL EXAM: VS:  BP (!) 139/94 (BP Location: Left Arm, Patient Position: Sitting, Cuff Size: Large)   Pulse 80   Ht 6' (1.829 m)   Wt (!) 386 lb 12 oz (175.4 kg)   BMI 52.45 kg/m  , BMI Body mass index is 52.45 kg/m.  GEN: Well nourished, well developed, in no acute distress ,  obese HEENT: normal  Neck: no JVD, carotid bruits, or masses Cardiac:Irregularly irregular  no murmurs, rubs, or gallops, trace nonpitting lower extremity  edema  Respiratory:  clear to auscultation bilaterally, normal work of breathing GI: soft, nontender, nondistended, + BS MS: no deformity or atrophy  Skin: warm and dry, no rash Neuro:  Strength and sensation are intact Psych: euthymic mood, full affect    Recent Labs: 05/29/2016: ALT 17; TSH 2.290 08/29/2016: Platelets 137 04/08/2017: BUN 39; Creatinine, Ser 1.62; Potassium 3.3; Sodium 140    Lipid Panel Lab Results  Component Value Date   CHOL 181 04/08/2017   HDL 27.30 (L) 04/08/2017   LDLCALC 137 (H) 05/22/2014   TRIG 274.0 (H) 04/08/2017      Wt Readings from Last 3 Encounters:   05/09/17 (!) 386 lb 12 oz (175.4 kg)  05/02/17 (!) 384 lb 6.4 oz (174.4 kg)  04/08/17 (!) 387 lb (175.5 kg)       ASSESSMENT AND PLAN:  Diastolic CHF, chronic (HCC) He prefers to stay on Lasix 80 in the morning with metolazone 2.5 mg daily  Creatinine running high end of his normal range , likely prerenal  He prefers to take metolazone every day Taking potassium 40 mEq twice a day  Chronic atrial fibrillation (HCC) Rate well controlled,  Warfarin managed by primary care. He prefers not to have this checked on a regular basis Last evaluated 2 months ago   Morbid obesity (HCC)  interested in bariatric surgery Would be acceptable risk for surgical  weight loss would be beneficial  Essential (primary) hypertension Blood pressure is well  controlled on today's visit. No changes made to the medications.  Pure hypercholesterolemia Acceptable range, total cholesterol 181, currently not on a cholesterol medication  Bilateral lower extremity edema Stable chronic leg edema, recommended compression hose Component of dependent edema  Type 2 diabetes mellitus with diabetic neuropathy, unspecified long term insulin use status (HCC)  Recommended strict diet, low carbohydrates  CKD (chronic kidney disease) stage 3, GFR 30-59 ml/min Baseline creatinine 1.3, 1.4 with diuretics, 1.6 with overdiuresis  Sweating Etiology unclear, happens twice recommended that he monitor his blood glucose when he has episodes Did not appear to be cardiac related given reasonably controlled heart rate and blood pressure Unable to exclude problems with his CPAP  he is declining referral to pulmonology at this time    Total encounter time more than 25 minutes  Greater than 50% was spent in counseling and coordination of care with the patient   Disposition:   F/U  12 months   Orders Placed This Encounter  Procedures  . EKG 12-Lead     Signed, Dossie Arbour, M.D., Ph.D. 05/09/2017  [facility] [place], [place] [phone]

## 2017-05-09 ENCOUNTER — Ambulatory Visit (INDEPENDENT_AMBULATORY_CARE_PROVIDER_SITE_OTHER): Payer: BLUE CROSS/BLUE SHIELD | Admitting: Cardiovascular Disease

## 2017-05-09 ENCOUNTER — Encounter: Payer: Self-pay | Admitting: Cardiovascular Disease

## 2017-05-09 VITALS — BP 139/94 | HR 80 | Ht 72.0 in | Wt 386.8 lb

## 2017-05-09 DIAGNOSIS — I5032 Chronic diastolic (congestive) heart failure: Secondary | ICD-10-CM | POA: Diagnosis not present

## 2017-05-09 DIAGNOSIS — I482 Chronic atrial fibrillation, unspecified: Secondary | ICD-10-CM

## 2017-05-09 DIAGNOSIS — E7800 Pure hypercholesterolemia, unspecified: Secondary | ICD-10-CM | POA: Diagnosis not present

## 2017-05-09 DIAGNOSIS — E1140 Type 2 diabetes mellitus with diabetic neuropathy, unspecified: Secondary | ICD-10-CM

## 2017-05-09 DIAGNOSIS — N183 Chronic kidney disease, stage 3 unspecified: Secondary | ICD-10-CM

## 2017-05-09 DIAGNOSIS — I10 Essential (primary) hypertension: Secondary | ICD-10-CM | POA: Diagnosis not present

## 2017-05-09 MED ORDER — METOLAZONE 5 MG PO TABS: 5.0000 mg | ORAL_TABLET | ORAL | 6 refills | Status: DC

## 2017-05-09 MED ORDER — BETAMETHASONE SOD PHOS & ACET 6 (3-3) MG/ML IJ SUSP: 3.0000 mg | Freq: Once | INTRAMUSCULAR | Status: DC

## 2017-05-09 NOTE — Patient Instructions (Signed)

## 2017-05-09 NOTE — Addendum Note (Signed)
Addended by: Felecia ShellingEVANS, Yazid Pop M on: 05/09/2017 05:30 PM   Modules accepted: Orders, Level of Service

## 2017-05-20 ENCOUNTER — Other Ambulatory Visit: Payer: Self-pay | Admitting: Family Medicine

## 2017-05-20 DIAGNOSIS — E1140 Type 2 diabetes mellitus with diabetic neuropathy, unspecified: Secondary | ICD-10-CM

## 2017-05-21 ENCOUNTER — Ambulatory Visit (INDEPENDENT_AMBULATORY_CARE_PROVIDER_SITE_OTHER): Payer: BLUE CROSS/BLUE SHIELD

## 2017-05-21 DIAGNOSIS — I482 Chronic atrial fibrillation, unspecified: Secondary | ICD-10-CM

## 2017-05-21 DIAGNOSIS — Z5181 Encounter for therapeutic drug level monitoring: Secondary | ICD-10-CM

## 2017-05-21 LAB — POCT INR: INR: 2.4

## 2017-05-21 NOTE — Patient Instructions (Signed)
Pre visit review using our clinic review tool, if applicable. No additional management support is needed unless otherwise documented below in the visit note. 

## 2017-05-28 ENCOUNTER — Ambulatory Visit: Payer: BLUE CROSS/BLUE SHIELD | Admitting: Podiatry

## 2017-05-30 ENCOUNTER — Encounter: Payer: BLUE CROSS/BLUE SHIELD | Attending: Surgery | Admitting: Registered"

## 2017-05-30 ENCOUNTER — Encounter: Payer: Self-pay | Admitting: Registered"

## 2017-05-30 DIAGNOSIS — Z713 Dietary counseling and surveillance: Secondary | ICD-10-CM | POA: Diagnosis not present

## 2017-05-30 DIAGNOSIS — E119 Type 2 diabetes mellitus without complications: Secondary | ICD-10-CM

## 2017-05-30 NOTE — Progress Notes (Signed)
Appt start time: 4:12 end time: 4:32   Supervised Weight Loss: 5th SWL Appointment.   Start Wt at [facility]: 391 lbs on 01/01/2017 Weight today: 389.7 lbs BMI: 52.85   Pt arrives 12 minutes late having gained 5.3 lbs from previous visit. Pt states he has a lot of family issues going on with parents and has been drinking alcohol. Pt states he drinks a few beers a night and a shot of liquor at nearby bar. Pt states he checks his BS: FBS (90-125). Pt reports he had a value of 140 this morning, higher than normal.    Preferred Learning Style:   No preference indicated   Learning Readiness:  Contemplating   MEDICATIONS: see list   DIETARY INTAKE:  24-hr recall:  B ( AM): 2 eggs, 1 sausage patty, 2 slices tomato, 1 whole wheat slice toast L ( PM): chef salad, sometimes skips Snk ( PM): none D ( PM): pho vietnamese, hot dog, peanut butter sandwich  Snk ( PM): none  Beverages: hot tea, skim milk, 2% milk, water, half sweet tea, alcohol  Usual physical activity: In too much pain  Diet to Follow: 2000-2200 calories 225-248 g carbohydrates 150-165 g protein 56-61 g fat   Nutritional Diagnosis:  [place]-3.3 Overweight/obesity related to past poor dietary habits and physical inactivity as evidenced by patient in SWL for pending bariatric surgery following dietary guidelines for continued weight loss.    Intervention:  Nutrition counseling for upcoming Bariatric Surgery.  Goals:  - Increase physical activity with arm exercises - Find a a variety of flavors of a liquid protein source that you enjoy with at least 15g or more of protein or 5g or less of carbohydrates for evenings - Reduce alcohol intake.  Teaching Method Utilized:  Auditory  Handouts given during visit include:  none  Barriers to learning/adherence to lifestyle change: current stress management skills  Demonstrated degree of understanding via:  Teach Back   Monitoring/Evaluation:  Dietary intake, exercise, and body  weight in 4 week(s).

## 2017-05-30 NOTE — Patient Instructions (Addendum)
-   Increase physical activity with arm exercises  - Find a a variety of flavors of a liquid protein source that you enjoy with at least 15g or more of protein or 5g or less of carbohydrates for evenings  - Reduce alcohol intake.

## 2017-06-13 ENCOUNTER — Telehealth: Payer: Self-pay | Admitting: Internal Medicine

## 2017-06-13 ENCOUNTER — Other Ambulatory Visit: Payer: Self-pay | Admitting: Family Medicine

## 2017-06-13 DIAGNOSIS — E1140 Type 2 diabetes mellitus with diabetic neuropathy, unspecified: Secondary | ICD-10-CM

## 2017-06-13 NOTE — Telephone Encounter (Signed)
Pt called wanting to make appointment.  We had no appointment here at this office.  I offered appointment at another location or to have pt talk to [facility].  Pt stated he could not drive to another location because his eyes were draining and he was having a hard time keeping them open.  I offered to transfer him to [facility] so they could give him some advise and they could make appointment for pt.  Phone disconnected or Patient hung up.  I ask Rena her advise she recommended I call pt back to transfer him to [facility] or she would speak to him.  I called back 1st time no answer  Tried 2nd time left message asking pt to call office

## 2017-06-13 NOTE — Telephone Encounter (Signed)
Thank you for your effort. I will let Dr. Alphonsus SiasLetvak know you tried.

## 2017-06-13 NOTE — Telephone Encounter (Signed)
Left detailed message on vm telling pt to call the office if he still needs an OV and Dr Alphonsus SiasLetvak would work him in at 445 today.

## 2017-06-13 NOTE — Telephone Encounter (Signed)
Please try to reach him I can add on at 4:45PM if something fairly discrete--like eye drainage

## 2017-06-14 ENCOUNTER — Other Ambulatory Visit: Payer: Self-pay | Admitting: Family Medicine

## 2017-06-14 DIAGNOSIS — E1140 Type 2 diabetes mellitus with diabetic neuropathy, unspecified: Secondary | ICD-10-CM

## 2017-06-18 ENCOUNTER — Telehealth: Payer: Self-pay | Admitting: Internal Medicine

## 2017-06-18 MED ORDER — GLIPIZIDE 10 MG PO TABS: 10.0000 mg | ORAL_TABLET | Freq: Two times a day (BID) | ORAL | 11 refills | Status: DC

## 2017-06-18 NOTE — Telephone Encounter (Signed)
He is my patient now Okay to refill for a year

## 2017-06-18 NOTE — Telephone Encounter (Signed)
Rx sent electronically.  

## 2017-06-18 NOTE — Telephone Encounter (Signed)
Pt called because he wanted to get his Glipizide refilled but the pharmacy told him they couldn't refill it because we dropped him as a patient.  I could not find any documentation in the chart to indicate that he is no longer our patient.   Can you please refill his Glipizide.  It was previously ordered by Dr. Shella Spearinghrismon but he hasnt seen him in over a year.  He is completely out of his medication.  He said he takes one pill  Twice a day. He uses the [facility] on [address] in [place].

## 2017-06-27 ENCOUNTER — Encounter: Payer: BLUE CROSS/BLUE SHIELD | Attending: Surgery | Admitting: Registered"

## 2017-06-27 ENCOUNTER — Ambulatory Visit: Payer: BLUE CROSS/BLUE SHIELD | Admitting: Registered"

## 2017-06-27 ENCOUNTER — Encounter: Payer: Self-pay | Admitting: Registered"

## 2017-06-27 DIAGNOSIS — E119 Type 2 diabetes mellitus without complications: Secondary | ICD-10-CM

## 2017-06-27 DIAGNOSIS — Z713 Dietary counseling and surveillance: Secondary | ICD-10-CM | POA: Diagnosis not present

## 2017-06-27 NOTE — Patient Instructions (Addendum)
-   Snack on protein options such as: jerky, protein shakes, cheese, deli meat.  - Check blood sugars and track numbers 3-4 times a day.  - Aim to not skip meals; aim to eat at least every 3-5 hrs; purchase protein shakes and drink in replacement.  - Continue to aim to not drink when eating; waiting 15 min after drinking to eat, not drinking while eating, and waiting 30 min after eating to drink.

## 2017-06-27 NOTE — Progress Notes (Signed)
Appt start time: 11:00 end time: 11:30  Supervised Weight Loss: 6th SWL Appointment.   Start Wt at [facility]: 391 lbs on 01/01/2017 Weight today: 380.7 BMI: 51.63   Pt arrives having lost 9 lbs from previous visit. 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 Pt states he has bought sandwich meat and [place] burgers recently from grocery store to prevent eating out so often and skipping meals. Pt states he has been busy. Pt states he has been practicing chewing well. Pt states he enjoys chocolate premier protein shakes. Pt states he has not been checking his BS recently because he is low on strips but FBS is around 135. Pt states he wants this process to be made as easy as possible with what to eat, staying on track, and having a variety of options provided to him.   This is pt's last SWL visit with us prior to surgery.    Preferred Learning Style:   No preference indicated   Learning Readiness:  Contemplating   MEDICATIONS: see list   DIETARY INTAKE:  24-hr recall:  B ( AM): 2 eggs, 3 sausage patty, whole wheat bread Snk (AM): none L ( PM): skips; chicken salad, cantaloupe, tomatoes, fries or chef salad or hot dogs Snk ( PM): none D ( PM): Sonic-double cheeseburger or  Snk ( PM): none  Beverages: hot tea, skim milk, 2% milk, water, half sweet tea, alcohol  Usual physical activity: In too much pain  Diet to Follow: 2000-2200 calories 225-248 g carbohydrates 150-165 g protein 56-61 g fat   Nutritional Diagnosis:  [place]-3.3 Overweight/obesity related to past poor dietary habits and physical inactivity as evidenced by patient in SWL for pending bariatric surgery following dietary guidelines for continued weight loss.    Intervention:  Nutrition counseling for upcoming Bariatric Surgery.  Goals:  - Snack on protein options such as: jerky, protein shakes, cheese, deli meat. - Check blood sugars and track numbers 3-4 times a day. - Aim to not skip  meals; aim to eat at least every 3-5 hrs; purchase protein shakes and drink in replacement. - Continue to aim to not drink when eating; waiting 15 min after drinking to eat, not drinking while eating, and waiting 30 min after eating to drink.  Teaching Method Utilized:  Auditory  Handouts given during visit include:  none  Barriers to learning/adherence to lifestyle change: work-life balance  Demonstrated degree of understanding via:  Teach Back   Monitoring/Evaluation:  Dietary intake, exercise, and body weight prn.

## 2017-06-28 ENCOUNTER — Other Ambulatory Visit: Payer: Self-pay | Admitting: Family Medicine

## 2017-06-28 DIAGNOSIS — M109 Gout, unspecified: Secondary | ICD-10-CM

## 2017-07-02 ENCOUNTER — Ambulatory Visit: Payer: BLUE CROSS/BLUE SHIELD

## 2017-07-02 ENCOUNTER — Telehealth: Payer: Self-pay

## 2017-07-02 NOTE — Telephone Encounter (Signed)
Patient no showed for INR check in the coumadin clinic today.  Lm on VM (ok per DPR) for patient to call me back and reschedule for next Tuesday (07/09/17).

## 2017-07-09 ENCOUNTER — Ambulatory Visit (INDEPENDENT_AMBULATORY_CARE_PROVIDER_SITE_OTHER): Payer: BLUE CROSS/BLUE SHIELD

## 2017-07-09 DIAGNOSIS — Z5181 Encounter for therapeutic drug level monitoring: Secondary | ICD-10-CM

## 2017-07-09 DIAGNOSIS — I482 Chronic atrial fibrillation, unspecified: Secondary | ICD-10-CM

## 2017-07-09 LAB — POCT INR: INR: 3.2

## 2017-07-09 NOTE — Patient Instructions (Signed)
Pre visit review using our clinic review tool, if applicable. No additional management support is needed unless otherwise documented below in the visit note. 

## 2017-08-05 ENCOUNTER — Encounter: Payer: BLUE CROSS/BLUE SHIELD | Attending: Surgery | Admitting: Skilled Nursing Facility1

## 2017-08-05 DIAGNOSIS — Z713 Dietary counseling and surveillance: Secondary | ICD-10-CM | POA: Diagnosis not present

## 2017-08-05 DIAGNOSIS — E119 Type 2 diabetes mellitus without complications: Secondary | ICD-10-CM

## 2017-08-06 ENCOUNTER — Encounter: Payer: Self-pay | Admitting: Skilled Nursing Facility1

## 2017-08-06 NOTE — Progress Notes (Signed)
Pre-Operative Nutrition Class:  Appt start time: 7943   End time:  1830.  Patient was seen on 08/06/2017 for Pre-Operative Bariatric Surgery Education at [facility].   Sleeve Gastrectomy Start Wt at [facility]: 391 lbs on 01/01/2017 Weight today: 395 lbs BMI: 55.09  Samples given per MNT protocol. Patient educated on appropriate usage: Bariatric Advantage Calcium  Lot # [id number] Exp: sep-26-2018  Celebrate Vitamins Calcium  Lot # [id number] Exp: 11/2018  Unjury powder Lot # [id number] Exp: 02/2018 The following the learning objectives were met by the patient during this course:  Identify Pre-Op Dietary Goals and will begin 2 weeks pre-operatively  Identify appropriate sources of fluids and proteins   State protein recommendations and appropriate sources pre and post-operatively  Identify Post-Operative Dietary Goals and will follow for 2 weeks post-operatively  Identify appropriate multivitamin and calcium sources  Describe the need for physical activity post-operatively and will follow MD recommendations  State when to call healthcare provider regarding medication questions or post-operative complications  Handouts given during class include:  Pre-Op Bariatric Surgery Diet Handout  Protein Shake Handout  Post-Op Bariatric Surgery Nutrition Handout  BEL[facility] Program Information Flyer  Support Group Information Flyer  [facility] Bariatric Supplements Price List  Follow-Up Plan: Patient will follow-up at [facility] 2 weeks post operatively for diet advancement per MD.

## 2017-08-20 ENCOUNTER — Ambulatory Visit (INDEPENDENT_AMBULATORY_CARE_PROVIDER_SITE_OTHER): Payer: BLUE CROSS/BLUE SHIELD

## 2017-08-20 DIAGNOSIS — I482 Chronic atrial fibrillation, unspecified: Secondary | ICD-10-CM

## 2017-08-20 DIAGNOSIS — Z5181 Encounter for therapeutic drug level monitoring: Secondary | ICD-10-CM

## 2017-08-20 LAB — POCT INR: INR: 2

## 2017-08-20 NOTE — Patient Instructions (Signed)
Pre visit review using our clinic review tool, if applicable. No additional management support is needed unless otherwise documented below in the visit note. 

## 2017-09-10 ENCOUNTER — Encounter: Payer: Self-pay | Admitting: Internal Medicine

## 2017-09-10 ENCOUNTER — Ambulatory Visit: Payer: BLUE CROSS/BLUE SHIELD | Admitting: Internal Medicine

## 2017-09-10 ENCOUNTER — Ambulatory Visit (INDEPENDENT_AMBULATORY_CARE_PROVIDER_SITE_OTHER): Payer: BLUE CROSS/BLUE SHIELD | Admitting: Internal Medicine

## 2017-09-10 VITALS — BP 124/78 | HR 98 | Temp 98.0°F | Wt 382.0 lb

## 2017-09-10 DIAGNOSIS — M25531 Pain in right wrist: Secondary | ICD-10-CM | POA: Diagnosis not present

## 2017-09-10 MED ORDER — PREDNISONE 10 MG PO TABS: ORAL_TABLET | ORAL | 0 refills | Status: DC

## 2017-09-10 NOTE — Patient Instructions (Signed)
Wrist Pain, Adult There are many things that can cause wrist pain. Some common causes include:  An injury to the wrist area.  Overuse of the joint.  A condition that causes too much pressure to be put on a nerve in the wrist (carpal tunnel syndrome).  Wear and tear of the joints that happens as a person gets older (osteoarthritis).  Other types of arthritis.  Sometimes, the cause of wrist pain is not known. Often, the pain goes away when you follow your doctor's instructions for helping pain at home, such as resting or icing your wrist. If your wrist pain does not go away, it is important to tell your doctor. Follow these instructions at home:  Rest the wrist area for 48 hours or more, or as long as told by your doctor.  If a splint or elastic bandage has been put on your wrist, use it as told by your doctor. ? Take off the splint or bandage only as told by your doctor. ? Loosen the splint or bandage if your fingers tingle, lose feeling (get numb), or turn cold or blue.  If directed, apply ice to the injured area: ? If you have a removable splint or elastic bandage, remove it as told by your doctor. ? Put ice in a plastic bag. ? Place a towel between your skin and the bag or between your splint or bandage and the bag. ? Leave the ice on for 20 minutes, 2-3 times a day.  Keep your arm raised (elevated) above the level of your heart while you are sitting or lying down.  Take over-the-counter and prescription medicines only as told by your doctor.  Keep all follow-up visits as told by your doctor. This is important. Contact a doctor if:  You have a sudden sharp pain in the wrist, hand, or arm that is different or new.  The swelling or bruising on your wrist or hand gets worse.  Your skin becomes red, gets a rash, or has open sores.  Your pain does not get better or it gets worse. Get help right away if:  You lose feeling in your fingers or hand.  Your fingers turn white,  very red, or cold and blue.  You cannot move your fingers.  You have a fever or chills. This information is not intended to replace advice given to you by your health care provider. Make sure you discuss any questions you have with your health care provider. Document Released: 05/28/2008 Document Revised: 07/05/2016 Document Reviewed: 06/28/2016 Elsevier Interactive Patient Education  2017 [employer]  

## 2017-09-10 NOTE — Progress Notes (Signed)
Subjective:    Patient ID: Marco James [facility], male    DOB: January 25, 1956, 61 y.o.   MRN: [id number]  HPI  Pt presents to the clinic today with c/o right wrist pain. He reports this started 3-4 weeks ago. He describes the pain as achy and throbbing. He has noticed some swelling but denies redness or warmth. He denies any injury to the area. He has tried Allopurinol, Colchicine and Indomethacin without much relief. He reports this wrist pain is interfering with his ability to work.  Review of Systems      Past Medical History:  Diagnosis Date  . A-fib (HCC)   . Cellulitis and abscess of leg 2009  . CHF (congestive heart failure) (HCC)   . Dupuytren's disease   . External hemorrhoids   . HTN (hypertension)   . Hyperlipidemia   . Hyperuricemia   . Obstructive sleep apnea   . Pneumonia   . Polysubstance abuse   . Toenail fungus   . Type 2 diabetes mellitus (HCC)     Current Outpatient Prescriptions  Medication Sig Dispense Refill  . allopurinol (ZYLOPRIM) 100 MG tablet take 1 tablet by mouth once daily 30 tablet 3  . chlorpheniramine-HYDROcodone (TUSSIONEX PENNKINETIC ER) 10-8 MG/5ML SUER Take 5 mLs by mouth every 12 (twelve) hours as needed. 115 mL 0  . doxazosin (CARDURA) 8 MG tablet Take 1 tablet (8 mg total) by mouth 2 (two) times daily. (Patient taking differently: Take 8 mg by mouth daily. ) 180 tablet 3  . fluticasone furoate-vilanterol (BREO ELLIPTA) 100-25 MCG/INH AEPB Inhale 1 puff into the lungs at bedtime. 28 each 0  . furosemide (LASIX) 80 MG tablet Take 1 tablet (80 mg total) by mouth daily. 90 tablet 3  . glipiZIDE (GLUCOTROL) 10 MG tablet Take 1 tablet (10 mg total) by mouth 2 (two) times daily before a meal. 60 tablet 11  . lisinopril (PRINIVIL,ZESTRIL) 40 MG tablet Take 1 tablet (40 mg total) by mouth daily. 90 tablet 3  . meloxicam (MOBIC) 15 MG tablet Take 1 tablet (15 mg total) by mouth daily. 30 tablet 3  . metoprolol (LOPRESSOR) 100 MG tablet Take 1 tablet (100  mg total) by mouth 2 (two) times daily. (Patient taking differently: Take 200 mg by mouth daily. ) 180 tablet 3  . NAPROXEN SODIUM PO Take 1,000 mg by mouth daily.    . NON FORMULARY CPAP    . potassium chloride SA (K-DUR,KLOR-CON) 20 MEQ tablet Take 2 tablets (40 mEq total) by mouth 2 (two) times daily. 360 tablet 1  . warfarin (COUMADIN) 6 MG tablet Take 1 tablet (6 mg total) by mouth daily. 30 tablet 6  . metolazone (ZAROXOLYN) 5 MG tablet Take 1 tablet (5 mg total) by mouth as directed. Take 1/2 to 1 tablet as needed once daily 30 tablet 6   Current Facility-Administered Medications  Medication Dose Route Frequency Provider Last Rate Last Dose  . betamethasone acetate-betamethasone sodium phosphate (CELESTONE) injection 3 mg  3 mg Intramuscular Once Felecia Shelling, DPM        No Known Allergies  Family History  Problem Relation Age of Onset  . Heart disease Father 24    Social History   Social History  . Marital status: Divorced    Spouse name: N/A  . Number of children: 1  . Years of education: N/A   Occupational History  . Toolmaker     self employed   Social History Main Topics  . Smoking  status: Former Smoker    Packs/day: 0.50    Years: 25.00    Types: Cigarettes  . Smokeless tobacco: Never Used  . Alcohol use Yes     Comment: just occasional  . Drug use: No     Comment: Pt. used cocaine for 3 years, marijuana--long ago  . Sexual activity: Not on file   Other Topics Concern  . Not on file   Social History Narrative   1 son in [place]     Constitutional: Denies fever, malaise, fatigue, headache or abrupt weight changes.  Musculoskeletal: Pt reports right wrist pain and swelling. Denies difficulty with gait, muscle pain.     No other specific complaints in a complete review of systems (except as listed in HPI above).  Objective:   Physical Exam   BP 124/78   Pulse 98   Temp 98 F (36.7 C) (Oral)   Wt (!) 382 lb (173.3 kg)   SpO2 97%   BMI  53.28 kg/m  Wt Readings from Last 3 Encounters:  09/10/17 (!) 382 lb (173.3 kg)  08/06/17 (!) 395 lb (179.2 kg)  06/27/17 (!) 380 lb 11.2 oz (172.7 kg)    General: Appears his stated age, obese in NAD. Skin: Warm, dry and intact. Musculoskeletal: Decreased flexion, extension and rotation of the right wrist. Swelling noted of the fingers. Pain with palpation over the right lateral wrist. Hand grips equal.    BMET    Component Value Date/Time   NA 140 04/08/2017 1628   NA 142 08/29/2016 1430   NA 138 05/22/2014 1337   K 3.3 (L) 04/08/2017 1628   K 3.5 05/22/2014 1337   CL 95 (L) 04/08/2017 1628   CL 100 05/22/2014 1337   CO2 37 (H) 04/08/2017 1628   CO2 32 05/22/2014 1337   GLUCOSE 78 04/08/2017 1628   GLUCOSE 108 (H) 05/22/2014 1337   BUN 39 (H) 04/08/2017 1628   BUN 21 08/29/2016 1430   BUN 16 05/22/2014 1337   CREATININE 1.62 (H) 04/08/2017 1628   CREATININE 0.75 05/22/2014 1337   CALCIUM 9.6 04/08/2017 1628   CALCIUM 8.7 05/22/2014 1337   GFRNONAA 55 (L) 08/29/2016 1430   GFRNONAA >60 05/22/2014 1337   GFRAA 63 08/29/2016 1430   GFRAA >60 05/22/2014 1337    Lipid Panel     Component Value Date/Time   CHOL 181 04/08/2017 1628   CHOL 199 05/22/2014 1337   TRIG 274.0 (H) 04/08/2017 1628   TRIG 122 05/22/2014 1337   HDL 27.30 (L) 04/08/2017 1628   HDL 38 (L) 05/22/2014 1337   CHOLHDL 7 04/08/2017 1628   VLDL 54.8 (H) 04/08/2017 1628   VLDL 24 05/22/2014 1337   LDLCALC 137 (H) 05/22/2014 1337    CBC    Component Value Date/Time   WBC 6.4 08/29/2016 1430   RBC 4.45 08/29/2016 1430   HGB 13.5 08/29/2016 1430   HCT 40.0 08/29/2016 1430   PLT 137 (L) 08/29/2016 1430   MCV 90 08/29/2016 1430   MCH 30.3 08/29/2016 1430   MCHC 33.8 08/29/2016 1430   RDW 14.3 08/29/2016 1430   LYMPHSABS 1.1 08/29/2016 1430   EOSABS 0.1 08/29/2016 1430   BASOSABS 0.0 08/29/2016 1430    Hgb A1C Lab Results  Component Value Date   HGBA1C 6.3 04/08/2017             Assessment & Plan:   Right Wrist Pain:  Concern for tendonitis Advised him that he should not be taking  NSAID's with Coumadin He has a brace, advised him to wear it eRx for Pred Taper x 9 days  Return precautions discussed Nicki Reaper, NP

## 2017-09-17 ENCOUNTER — Telehealth: Payer: Self-pay

## 2017-09-17 NOTE — Telephone Encounter (Signed)
I called Marco James and he scheduled appt to see Dr Alphonsus Sias 09/18/17 at 8:45. Marco James hopes this is going to help or Marco James wants to see specialist.

## 2017-09-17 NOTE — Telephone Encounter (Signed)
Will reevaluate and decide if referral appropriate

## 2017-09-17 NOTE — Telephone Encounter (Signed)
Pt left v/m pt was seen 09/10/17 for tendonitis;pt thought he might have gout; pt has finished prednisone and is no better; joints and fingers are very painful; pain level is at least 5; pt cannot make a fist. Pt request cb. [facility] [address]

## 2017-09-17 NOTE — Telephone Encounter (Signed)
I told him if he was no better, to follow up with Dr. Alphonsus Sias. He needs to make an appt with Dr. Alphonsus Sias

## 2017-09-18 ENCOUNTER — Ambulatory Visit (INDEPENDENT_AMBULATORY_CARE_PROVIDER_SITE_OTHER): Payer: BLUE CROSS/BLUE SHIELD | Admitting: Internal Medicine

## 2017-09-18 ENCOUNTER — Other Ambulatory Visit: Payer: Self-pay

## 2017-09-18 ENCOUNTER — Ambulatory Visit (INDEPENDENT_AMBULATORY_CARE_PROVIDER_SITE_OTHER)
Admission: RE | Admit: 2017-09-18 | Discharge: 2017-09-18 | Disposition: A | Discharge status: Home / Self Care | Payer: BLUE CROSS/BLUE SHIELD | Source: Ambulatory Visit | Attending: Internal Medicine | Admitting: Internal Medicine

## 2017-09-18 ENCOUNTER — Encounter: Payer: Self-pay | Admitting: Internal Medicine

## 2017-09-18 VITALS — BP 104/70 | HR 84 | Temp 97.7°F | Wt 376.0 lb

## 2017-09-18 DIAGNOSIS — M659 Unspecified synovitis and tenosynovitis, unspecified site: Secondary | ICD-10-CM

## 2017-09-18 DIAGNOSIS — M109 Gout, unspecified: Secondary | ICD-10-CM | POA: Insufficient documentation

## 2017-09-18 DIAGNOSIS — M1A9XX0 Chronic gout, unspecified, without tophus (tophi): Secondary | ICD-10-CM

## 2017-09-18 LAB — RENAL FUNCTION PANEL
ALBUMIN: 4.2 g/dL (ref 3.5–5.2)
BUN: 47 mg/dL — AB (ref 6–23)
CALCIUM: 9.6 mg/dL (ref 8.4–10.5)
CHLORIDE: 94 meq/L — AB (ref 96–112)
CO2: 31 meq/L (ref 19–32)
Creatinine, Ser: 1.52 mg/dL — ABNORMAL HIGH (ref 0.40–1.50)
GFR: 49.98 mL/min — ABNORMAL LOW (ref >60.00)
Glucose, Bld: 178 mg/dL — ABNORMAL HIGH (ref 70–99)
POTASSIUM: 3.4 meq/L — AB (ref 3.5–5.1)
Phosphorus: 4.2 mg/dL (ref 2.3–4.6)
SODIUM: 134 meq/L — AB (ref 135–145)

## 2017-09-18 LAB — SEDIMENTATION RATE: SED RATE: 16 mm/h (ref 0–20)

## 2017-09-18 LAB — URIC ACID: URIC ACID, SERUM: 11.1 mg/dL — AB (ref 4.0–7.8)

## 2017-09-18 MED ORDER — ALLOPURINOL 300 MG PO TABS: 300.0000 mg | ORAL_TABLET | Freq: Every day | ORAL | 11 refills | Status: DC

## 2017-09-18 NOTE — Assessment & Plan Note (Signed)
Could be gout but limited response to colchicine and prednisone May need higher dose of allopurinol Will check some labs and proceed with rheumatology referral Check wrist x-ray

## 2017-09-18 NOTE — Progress Notes (Signed)
Subjective:    Patient ID: Marco James [facility], male    DOB: 09-14-1956, 61 y.o.   MRN: [id number]  HPI Here due to ongoing right wrist pain  Started there--but now in fingers in both hands Hard to even make a fist Right shoulder also bad  No injuries Wrist pain started last month--hard to pinpoint start but already bad on 9/1 Slight improvement after that--then improving Concerned it was gout-- tried colchicine x 2 days and then again later.  May have helped some--but didn't knock it out  Seen here---prednisone taper did help--but didn't get rid of it Tried friend's indomethacin--also mild help  Current Outpatient Prescriptions on File Prior to Visit  Medication Sig Dispense Refill  . allopurinol (ZYLOPRIM) 100 MG tablet take 1 tablet by mouth once daily 30 tablet 3  . doxazosin (CARDURA) 8 MG tablet Take 1 tablet (8 mg total) by mouth 2 (two) times daily. (Patient taking differently: Take 8 mg by mouth daily. ) 180 tablet 3  . furosemide (LASIX) 80 MG tablet Take 1 tablet (80 mg total) by mouth daily. 90 tablet 3  . glipiZIDE (GLUCOTROL) 10 MG tablet Take 1 tablet (10 mg total) by mouth 2 (two) times daily before a meal. 60 tablet 11  . lisinopril (PRINIVIL,ZESTRIL) 40 MG tablet Take 1 tablet (40 mg total) by mouth daily. 90 tablet 3  . metoprolol (LOPRESSOR) 100 MG tablet Take 1 tablet (100 mg total) by mouth 2 (two) times daily. (Patient taking differently: Take 200 mg by mouth daily. ) 180 tablet 3  . NON FORMULARY CPAP    . potassium chloride SA (K-DUR,KLOR-CON) 20 MEQ tablet Take 2 tablets (40 mEq total) by mouth 2 (two) times daily. 360 tablet 1  . warfarin (COUMADIN) 6 MG tablet Take 1 tablet (6 mg total) by mouth daily. 30 tablet 6  . metolazone (ZAROXOLYN) 5 MG tablet Take 1 tablet (5 mg total) by mouth as directed. Take 1/2 to 1 tablet as needed once daily 30 tablet 6   No current facility-administered medications on file prior to visit.     No Known Allergies  Past  Medical History:  Diagnosis Date  . A-fib (HCC)   . Cellulitis and abscess of leg 2009  . CHF (congestive heart failure) (HCC)   . Dupuytren's disease   . External hemorrhoids   . HTN (hypertension)   . Hyperlipidemia   . Hyperuricemia   . Obstructive sleep apnea   . Pneumonia   . Polysubstance abuse   . Toenail fungus   . Type 2 diabetes mellitus (HCC)     Past Surgical History:  Procedure Laterality Date  . COLONOSCOPY WITH PROPOFOL N/A 10/25/2015   Procedure: COLONOSCOPY WITH PROPOFOL;  Surgeon: Midge Minium, MD;  Location: [facility] ENDOSCOPY;  Service: Endoscopy;  Laterality: N/A;  . NASAL SINUS SURGERY    . WISDOM TOOTH EXTRACTION      Family History  Problem Relation Age of Onset  . Heart disease Father 71    Social History   Social History  . Marital status: Divorced    Spouse name: N/A  . Number of children: 1  . Years of education: N/A   Occupational History  . Toolmaker     self employed   Social History Main Topics  . Smoking status: Former Smoker    Packs/day: 0.50    Years: 25.00    Types: Cigarettes  . Smokeless tobacco: Never Used  . Alcohol use Yes  Comment: just occasional  . Drug use: No     Comment: Pt. used cocaine for 3 years, marijuana--long ago  . Sexual activity: Not on file   Other Topics Concern  . Not on file   Social History Narrative   1 son in [place]   Review of Systems No fever, chills or recent infection No rash    Objective:   Physical Exam  Constitutional: No distress.  Musculoskeletal:  Swelling (synovitis) and tenderness in right wrist. Swelling/pain in left 4th PIP, and several on right hand No other true swollen joints          Assessment & Plan:

## 2017-09-19 LAB — RHEUMATOID FACTOR: Rhuematoid fact SerPl-aCnc: <14 IU/mL (ref <14)

## 2017-09-20 ENCOUNTER — Encounter: Payer: Self-pay | Admitting: Internal Medicine

## 2017-09-20 MED ORDER — PREDNISONE 20 MG PO TABS: 40.0000 mg | ORAL_TABLET | Freq: Every day | ORAL | 0 refills | Status: DC

## 2017-09-24 ENCOUNTER — Other Ambulatory Visit: Payer: Self-pay | Admitting: Family Medicine

## 2017-09-27 ENCOUNTER — Other Ambulatory Visit: Payer: Self-pay | Admitting: Family Medicine

## 2017-09-27 ENCOUNTER — Other Ambulatory Visit: Payer: Self-pay | Admitting: Internal Medicine

## 2017-10-05 ENCOUNTER — Other Ambulatory Visit: Payer: Self-pay | Admitting: Family Medicine

## 2017-10-05 DIAGNOSIS — I10 Essential (primary) hypertension: Secondary | ICD-10-CM

## 2017-10-07 ENCOUNTER — Other Ambulatory Visit: Payer: Self-pay | Admitting: Internal Medicine

## 2017-10-07 DIAGNOSIS — I10 Essential (primary) hypertension: Secondary | ICD-10-CM

## 2017-10-14 ENCOUNTER — Ambulatory Visit: Payer: BLUE CROSS/BLUE SHIELD | Admitting: Internal Medicine

## 2017-10-15 ENCOUNTER — Ambulatory Visit (INDEPENDENT_AMBULATORY_CARE_PROVIDER_SITE_OTHER): Payer: BLUE CROSS/BLUE SHIELD

## 2017-10-15 ENCOUNTER — Encounter: Payer: Self-pay | Admitting: Internal Medicine

## 2017-10-15 ENCOUNTER — Ambulatory Visit (INDEPENDENT_AMBULATORY_CARE_PROVIDER_SITE_OTHER): Payer: BLUE CROSS/BLUE SHIELD | Admitting: Internal Medicine

## 2017-10-15 VITALS — BP 110/64 | HR 51 | Temp 97.9°F | Wt 381.2 lb

## 2017-10-15 DIAGNOSIS — E1140 Type 2 diabetes mellitus with diabetic neuropathy, unspecified: Secondary | ICD-10-CM

## 2017-10-15 DIAGNOSIS — Z5181 Encounter for therapeutic drug level monitoring: Secondary | ICD-10-CM

## 2017-10-15 DIAGNOSIS — I10 Essential (primary) hypertension: Secondary | ICD-10-CM | POA: Diagnosis not present

## 2017-10-15 DIAGNOSIS — I482 Chronic atrial fibrillation, unspecified: Secondary | ICD-10-CM

## 2017-10-15 DIAGNOSIS — M659 Unspecified synovitis and tenosynovitis, unspecified site: Secondary | ICD-10-CM

## 2017-10-15 LAB — LIPID PANEL
CHOLESTEROL: 180 mg/dL (ref 0–200)
HDL: 32.6 mg/dL — AB (ref >39.00)
LDL Cholesterol: 110 mg/dL — ABNORMAL HIGH (ref 0–99)
NonHDL: 147.32
Total CHOL/HDL Ratio: 6
Triglycerides: 186 mg/dL — ABNORMAL HIGH (ref 0.0–149.0)
VLDL: 37.2 mg/dL (ref 0.0–40.0)

## 2017-10-15 LAB — HEMOGLOBIN A1C: HEMOGLOBIN A1C: 6.7 % — AB (ref 4.6–6.5)

## 2017-10-15 LAB — POCT INR: INR: 2.9

## 2017-10-15 MED ORDER — ALLOPURINOL 100 MG PO TABS: 100.0000 mg | ORAL_TABLET | Freq: Every day | ORAL | 3 refills | Status: DC

## 2017-10-15 MED ORDER — DOXAZOSIN MESYLATE 8 MG PO TABS: 8.0000 mg | ORAL_TABLET | Freq: Every day | ORAL | 0 refills | Status: DC
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
## 2017-10-15 NOTE — Assessment & Plan Note (Signed)
Gout and degenerative Seeing Dr Kathi LudwigSyed

## 2017-10-15 NOTE — Patient Instructions (Signed)
Decrease the doxazosin to 8mg  once a day. Increase the allopurinol to 400mg  daily.

## 2017-10-15 NOTE — Patient Instructions (Signed)
Pre visit review using our clinic review tool, if applicable. No additional management support is needed unless otherwise documented below in the visit note. 

## 2017-10-15 NOTE — Assessment & Plan Note (Signed)
Rate control is fine On coumadin

## 2017-10-15 NOTE — Assessment & Plan Note (Signed)
Having some orthostatic dizziness Will cut the doxazosin back to just 8mg  daily

## 2017-10-15 NOTE — Assessment & Plan Note (Signed)
Hasn't been checking sugars and on prednisone Is getting bariatric procedure so may need to wean meds after this Ongoing neuropathy but no major pain issues

## 2017-10-17 ENCOUNTER — Telehealth: Payer: Self-pay

## 2017-10-17 NOTE — Telephone Encounter (Addendum)
Patient stated that he will have bariatric surgery on 11/18/17.  I discussed with him whether or not he is aware of need to hold coumadin for the procedure.  He stated, " I have to hold several medicines, that's up to you and Dr. Alphonsus SiasLetvak to do".    Dr. Alphonsus SiasLetvak,  Do you have any information from bariatric clinic as to what we are doing with his coumadin?  I do not for-see a lovenox bridge, but very unclear on details of upcoming procedure.    [facility]?  Patient was very dismissive of my attempts to explain what is involved with a coumadin hold.  I explained that (if we hold) I will need to see him before procedure to check INR and then again following procedure as we are trying to boost him back to therapeutic range.  He became very argumentative and stated that he would not be able to come back in for several weeks.  His coag visit on 1023/18 was very difficult.  He continues to be angry, with cursing and very argumentative with me about why he has to come every 6 weeks and refuses to come any sooner. This is his behavior at every coag visit appointment. (Note: he has been very consistent so 6 weeks is okay at this point, but he complains about even coming at the 6 week interval).  He accuses me multiple times of "only caring about taking his money" and he does not keep me informed of any changes to his meds, health or diet in the meantime.  When I ask, he becomes irritated and resists all efforts that I make to educate him on possible interactions or to discuss his general coumadin care at visits.    He adamantly states that he is always normal and I should give him "credit" for that and acknowledge that "he is not other people", when I try to explain how sensitive coumadin can be and why we have to monitor so frequently.   If we have to hold coumadin,  (which I expect we will), I may need your help in speaking with this patient if he absolutely refuses to follow  instructions.    Please share your thoughts to highlighted questions above.  Thanks.

## 2017-10-17 NOTE — Telephone Encounter (Signed)
I am sure he will need his coumadin held--- usually for 5-7 days before surgery (and this is generally okay when it is used for atrial fibrillation-----with no lovenox bridge). You can confirm this with Dr Mariah MillingGollan. I don't think other meds will need to be held. (other than diabetic meds that day) I told him he would need probably  cardiac clearance--so all these issues can be addressed. Doesn't need separate medical clearance otherwise Sorry he gives you such a hard time.

## 2017-11-04 NOTE — Telephone Encounter (Signed)
I will forward this communication to Medtroniclexis Scotece, RD, regarding concerns for upcoming procedure.  Marco James,  I am the coumadin clinic nurse here at [facility] and am overseeing patient's coumadin care.  He was unaware at last visit of any recommendations regarding a hold on his coumadin and will need consideration for that with any invasive procedure.  Who should I discuss this with as he does not have a clearance appointment set up with his cardiologist as Dr. Alphonsus SiasLetvak has suggested below?  Dr. Alphonsus SiasLetvak recommended possible cardiac clearance prior to his surgery to address patient's medical clearance needs for 11/18/17 surgery and informed patient of this.  I see that he has not yet made an appointment and has his pre-admit appt scheduled for 11/07/17.    Please let me know who I should be in discussion with regarding these concerns.    Thanks.

## 2017-11-04 NOTE — Progress Notes (Signed)
Please place orders in Epic as patient has a pre-op appointment on 11/07/2017! Thank you! 

## 2017-11-05 NOTE — Progress Notes (Signed)
Please place orders in Epic as patient has a pre-op appointment on 11/07/2017! Thank you! 

## 2017-11-06 NOTE — Patient Instructions (Addendum)
Marco James  11/06/2017   Your procedure is scheduled on: 11-18-17  [facility] Main  Entrance Take [place]  elevators to 3rd floor to  Short Stay Center at       0530 AM.    Call this number if you have problems the morning of surgery [phone]    Remember: ONLY 1 PERSON MAY GO WITH YOU TO SHORT STAY TO GET  READY MORNING OF YOUR SURGERY.  Do not eat food or drink liquids :After Midnight.     Take these medicines the morning of surgery with A SIP OF WATER: METOPROLOL, DOXAZOSIN, COLCHINE ,ALLOPURINOL              DO NOT TAKE ANY DIABETIC MEDICATIONS DAY OF YOUR SURGERY                               You may not have any metal on your body including hair pins and              piercings  Do not wear jewelry,lotions, powders or perfumes, deodorant           .              Men may shave face and neck.   Do not bring valuables to the hospital. [facility] IS NOT             RESPONSIBLE   FOR VALUABLES.  Contacts, dentures or bridgework may not be worn into surgery.  Leave suitcase in the car. After surgery it may be brought to your room.                Please read over the following fact sheets you were given: _____________________________________________________________________          [facility] - Preparing for [facility] Before surgery, you can play an important role.  Because skin is not sterile, your skin needs to be as free of germs as possible.  You can reduce the number of germs on your skin by washing with CHG (chlorahexidine gluconate) soap before surgery.  CHG is an antiseptic cleaner which kills germs and bonds with the skin to continue killing germs even after washing. Please DO NOT use if you have an allergy to CHG or antibacterial soaps.  If your skin becomes reddened/irritated stop using the CHG and inform your nurse when you arrive at Short Stay. Do not shave (including legs and underarms) for at least 48 hours prior to the first  CHG shower.  You may shave your face/neck. Please follow these instructions carefully:  1.  Shower with CHG Soap the night before surgery and the  morning of Surgery.  2.  If you choose to wash your hair, wash your hair first as usual with your  normal  shampoo.  3.  After you shampoo, rinse your hair and body thoroughly to remove the  shampoo.                           4.  Use CHG as you would any other liquid soap.  You can apply chg directly  to the skin and wash                       Gently with a  scrungie or clean washcloth.  5.  Apply the CHG Soap to your body ONLY FROM THE NECK DOWN.   Do not use on face/ open                           Wound or open sores. Avoid contact with eyes, ears mouth and genitals (private parts).                       Wash face,  Genitals (private parts) with your normal soap.             6.  Wash thoroughly, paying special attention to the area where your surgery  will be performed.  7.  Thoroughly rinse your body with warm water from the neck down.  8.  DO NOT shower/wash with your normal soap after using and rinsing off  the CHG Soap.                9.  Pat yourself dry with a clean towel.            10.  Wear clean pajamas.            11.  Place clean sheets on your bed the night of your first shower and do not  sleep with pets. Day of Surgery : Do not apply any lotions/deodorants the morning of surgery.  Please wear clean clothes to the hospital/surgery center.  FAILURE TO FOLLOW THESE INSTRUCTIONS MAY RESULT IN THE CANCELLATION OF YOUR SURGERY PATIENT SIGNATURE_________________________________  NURSE SIGNATURE__________________________________  ________________________________________________________________________ How to Manage Your Diabetes Before and After Surgery  Why is it important to control my blood sugar before and after surgery? . Improving blood sugar levels before and after surgery helps healing and can limit problems. . A way of  improving blood sugar control is eating a healthy diet by: o  Eating less sugar and carbohydrates o  Increasing activity/exercise o  Talking with your doctor about reaching your blood sugar goals . High blood sugars (greater than 180 mg/dL) can raise your risk of infections and slow your recovery, so you will need to focus on controlling your diabetes during the weeks before surgery. . Make sure that the doctor who takes care of your diabetes knows about your planned surgery including the date and location.  How do I manage my blood sugar before surgery? . Check your blood sugar at least 4 times a day, starting 2 days before surgery, to make sure that the level is not too high or low. o Check your blood sugar the morning of your surgery when you wake up and every 2 hours until you get to the Short Stay unit. . If your blood sugar is less than 70 mg/dL, you will need to treat for low blood sugar: o Do not take insulin. o Treat a low blood sugar (less than 70 mg/dL) with  cup of clear juice (cranberry or apple), 4 glucose tablets, OR glucose gel. o Recheck blood sugar in 15 minutes after treatment (to make sure it is greater than 70 mg/dL). If your blood sugar is not greater than 70 mg/dL on recheck, call [phone] for further instructions. . Report your blood sugar to the short stay nurse when you get to Short Stay.  . If you are admitted to the hospital after surgery: o Your blood sugar will be checked by the staff and you will probably be given insulin after  surgery (instead of oral diabetes medicines) to make sure you have good blood sugar levels. o The goal for blood sugar control after surgery is 80-180 mg/dL.   WHAT DO I DO ABOUT MY DIABETES MEDICATION?  Marland Kitchen. Do not take oral diabetes medicines (pills) the morning of surgery.  Marland Kitchen. GLIPIZIDE DAY BEFORE SURGERY ONLY TAKE MORNING OR LUNCH DOSES   NONE AT BEDTIME     Patient Signature:  Date:   Nurse Signature:  Date:   Reviewed and  Endorsed by [facility] Patient Education Committee, August 2015

## 2017-11-06 NOTE — Telephone Encounter (Signed)
I have not received a response or direction on this as of yet.  Will forward to Dr. Windell HummingbirdGollan's office for further advice/ discussion.

## 2017-11-06 NOTE — Telephone Encounter (Signed)
He should be acceptable risk for surgery I will just have our  triage team call him to verify there is been no dramatic change in his fluid status, leg swelling, shortness of breath etc.  Should be okay to hold the warfarin 5 days prior to surgery  One thing he may want to consider would be changing to a NOAC , like eliquis Potentially this could be started after surgery following recovery

## 2017-11-06 NOTE — Progress Notes (Signed)
Please place orders in Epic as patient has a pre-op appointment on 11/07/2017! Thank you! 

## 2017-11-07 ENCOUNTER — Encounter (HOSPITAL_COMMUNITY)
Admission: RE | Admit: 2017-11-07 | Discharge: 2017-11-07 | Disposition: A | Discharge status: Home / Self Care | Payer: BLUE CROSS/BLUE SHIELD | Source: Ambulatory Visit | Attending: Surgery | Admitting: Surgery

## 2017-11-07 ENCOUNTER — Encounter (HOSPITAL_COMMUNITY): Payer: Self-pay

## 2017-11-07 ENCOUNTER — Other Ambulatory Visit: Payer: Self-pay

## 2017-11-07 DIAGNOSIS — Z01812 Encounter for preprocedural laboratory examination: Secondary | ICD-10-CM | POA: Diagnosis not present

## 2017-11-07 HISTORY — DX: Cardiac arrhythmia, unspecified: I49.9

## 2017-11-07 HISTORY — DX: Unspecified osteoarthritis, unspecified site: M19.90

## 2017-11-07 HISTORY — DX: Dyspnea, unspecified: R06.00

## 2017-11-07 LAB — BASIC METABOLIC PANEL
Anion gap: 11 (ref 5–15)
BUN: 48 mg/dL — AB (ref 6–20)
CO2: 31 mmol/L (ref 22–32)
Calcium: 9.4 mg/dL (ref 8.9–10.3)
Chloride: 96 mmol/L — ABNORMAL LOW (ref 101–111)
Creatinine, Ser: 1.56 mg/dL — ABNORMAL HIGH (ref 0.61–1.24)
GFR, EST AFRICAN AMERICAN: 54 mL/min — AB (ref >60)
GFR, EST NON AFRICAN AMERICAN: 47 mL/min — AB (ref >60)
Glucose, Bld: 183 mg/dL — ABNORMAL HIGH (ref 65–99)
POTASSIUM: 3.2 mmol/L — AB (ref 3.5–5.1)
SODIUM: 138 mmol/L (ref 135–145)

## 2017-11-07 LAB — CBC
HCT: 41 % (ref 39.0–52.0)
Hemoglobin: 13.8 g/dL (ref 13.0–17.0)
MCH: 30.2 pg (ref 26.0–34.0)
MCHC: 33.7 g/dL (ref 30.0–36.0)
MCV: 89.7 fL (ref 78.0–100.0)
PLATELETS: 124 10*3/uL — AB (ref 150–400)
RBC: 4.57 MIL/uL (ref 4.22–5.81)
RDW: 15.8 % — ABNORMAL HIGH (ref 11.5–15.5)
WBC: 12.1 10*3/uL — AB (ref 4.0–10.5)

## 2017-11-07 LAB — GLUCOSE, CAPILLARY: GLUCOSE-CAPILLARY: 221 mg/dL — AB (ref 65–99)

## 2017-11-07 NOTE — Progress Notes (Addendum)
CBC  And bmp   routed to Dr. Daphine DeutscherMartin via epic 11/07/17

## 2017-11-07 NOTE — Progress Notes (Signed)
Requested bari bed from portable equipment

## 2017-11-07 NOTE — Telephone Encounter (Signed)
It would be great for him to switch to a NOAC but not sure if he can afford it. We may need to check with his pharmacist to see what is covered under his insurance

## 2017-11-07 NOTE — Progress Notes (Addendum)
ekg 05/09/17 epic 10/15/17 [id number] epic cxr 01/03/17 epic CLEARANCE DR. GOLLAN 11/07/17 CHART

## 2017-11-07 NOTE — Telephone Encounter (Signed)
Attempted to call patient to verify his status. No answer. Left message to call back.

## 2017-11-08 NOTE — Telephone Encounter (Signed)
Recommend the following for hold and f/u boost:  1.  Last dose of Coumadin on 11/12/17   (procedure 11/26) 2.  Restart Coumadin taking 9 mg for a boost 11/27, 11/28, 11/29 and then resume 6mg  daily on 11/30.  Recheck on Tuesday 12/4.    Dr. Alphonsus SiasLetvak - does above dosing plan sound reasonable to you?  And I will discuss with him NOAC options and if he would like for us to discuss with his pharmacy.  Thanks.

## 2017-11-09 NOTE — Telephone Encounter (Signed)
That sounds fine

## 2017-11-11 NOTE — Telephone Encounter (Signed)
Spoke with patient. He is interested in transitioning over to Eliquis and will be speaking with his pharmacist in the meantime regarding cost.  In the meantime, we will move forward with coumadin hold/boost plan and INR recheck on 11/26/17.  Patient will let me know what he finds out from pharmacy and if he will be transitioning over to Eliquis rather than resuming coumadin on 11/27.    Patient has no further questions and was able to repeat back to me appropriate hold/dosing plan on the coumadin.  I will also send him a message in my chart (per patient request) of instructions and next appointment time.

## 2017-11-12 NOTE — Progress Notes (Signed)
Spoke with pt. On the phone to inform of surgery time change and to arrive at 9:50 instead of 0530 am pt. VU.

## 2017-11-13 ENCOUNTER — Ambulatory Visit: Payer: Self-pay | Admitting: Surgery

## 2017-11-13 NOTE — H&P (Signed)
Marco James 10/03/2017 11:18 AM Location: [place] [facility] Patient #: [id number] DOB: 04/08/1956 Single / Language: English / Race: White Male   History of Present Illness ( The patient is a 61 year old male who presents for a bariatric surgery evaluation. Marco James was initially seen by me in December 2017 and bariatric consultation.  He has diabetes, hypertension, and sleep apnea as well as gout.  He is also in atrial fibrillation.  He has completed his evaluations including a psych eval by Dr. Lori who approved for surgery.  He has an upper GI this showed a  small reducible hiatal hernia without visible reflux.  In addition he had a moderate sized fourth portion duodenal diverticulum.  He has significant gout and Dupuytren's contractures.  I think that sleeve gastrectomy would help him a lot in terms of changing his physiology and his requirement for medications.  Ultimately this may translate into better management of his gout.    We have discussed sleeve gastrectomy in some detail.  He is aware of the risk and benefits and is ready to move forward with surgery.  His weight today is 378 which is down from his prior weight of 397   Allergies (Marco James, CMA; 10/03/2017 11:18 AM) No Known Drug Allergies  12/06/2016  Medication History (Marco James, CMA; 10/03/2017 11:19 AM) Lisinopril  (40MG Tablet, Oral) Active. Potassium Chloride Crys ER  (20MEQ Tablet ER, Oral) Active. Metoprolol Tartrate  (100MG Tablet, Oral) Active. Allopurinol  (100MG Tablet, Oral) Active. MetOLazone  (2.5MG Tablet, Oral) Active. Doxazosin Mesylate  (8MG Tablet, Oral) Active. GlipiZIDE  (10MG Tablet, Oral) Active. Warfarin Sodium  (6MG Tablet, Oral) Active. Furosemide  (80MG Tablet, Oral) Active. Naproxen  (500MG Tablet, Oral) Active. Medications Reconciled   Vitals (Marco James CMA; 10/03/2017 11:18 AM) 10/03/2017 11:18 AM Weight: 378.4 lb   Height: 71.5 in  Body  Surface Area: 2.78 m²   Body Mass Index: 52.04 kg/m²   Pulse: 113 (Regular)    BP: 128/82 (Sitting, Left Arm, Standard)  Physical Exam ( General Note:  obese white male with a long gray hair and centripetal obesity. HEENT exam unremarkable. Neck supple. Chest clear heart irregularly irregular rhythm rate controlled abdomen protuberant abdomen with centripetal obesity. Extremity exam with pain of gout in his arms. Alert and oriented   Assessment & Plan (Marco James B. Etienne Mowers MD; 10/03/2017 12:25 PM) MORBID OBESITY, UNSPECIFIED OBESITY TYPE (E66.01) Impression: BMi 55 wm who has demonstrated ability to lose weight but with weight regain. he is benefits workup for 11 months and has a small hiatal hernia which may need to be repaired at the time of his sleeve gastrectomy. He has lost weight down to 378. BMI of 52. Plan schedule sleeve gastrectomy when approved by insurance  

## 2017-11-13 NOTE — H&P (View-Only) (Signed)
[facility] 10/03/2017 11:18 AM Location: [place] [facility] Patient #: [id number] DOB: 02/18/1956 Single / Language: [place] / Race: White Male   History of Present Illness ( The patient is a 61 year old male who presents for a bariatric surgery evaluation. Mister Lorayne MarekMasiello was initially seen by me in December 2017 and bariatric consultation. He has diabetes, hypertension, and sleep apnea as well as gout. He is also in atrial fibrillation. He has completed his evaluations including a psych eval by Dr. Lawson FiscalLori who approved for surgery. He has an upper GI this showed a  small reducible hiatal hernia without visible reflux. In addition he had a moderate sized fourth portion duodenal diverticulum.  He has significant gout and Dupuytren's contractures. I think that sleeve gastrectomy would help him a lot in terms of changing his physiology and his requirement for medications. Ultimately this may translate into better management of his gout.   We have discussed sleeve gastrectomy in some detail. He is aware of the risk and benefits and is ready to move forward with surgery. His weight today is 378 which is down from his prior weight of 397   Allergies (Chemira Jones, CMA; 10/03/2017 11:18 AM) No Known Drug Allergies 12/06/2016  Medication History (Chemira Jones, CMA; 10/03/2017 11:19 AM) Lisinopril (40MG  Tablet, Oral) Active. Potassium Chloride Crys ER (20MEQ Tablet ER, Oral) Active. Metoprolol Tartrate (100MG  Tablet, Oral) Active. Allopurinol (100MG  Tablet, Oral) Active. MetOLazone (2.5MG  Tablet, Oral) Active. Doxazosin Mesylate (8MG  Tablet, Oral) Active. GlipiZIDE (10MG  Tablet, Oral) Active. Warfarin Sodium (6MG  Tablet, Oral) Active. Furosemide (80MG  Tablet, Oral) Active. Naproxen (500MG  Tablet, Oral) Active. Medications Reconciled  Vitals (Chemira Jones CMA; 10/03/2017 11:18 AM) 10/03/2017 11:18 AM Weight: 378.4 lb Height: 71.5in Body  Surface Area: 2.78 m Body Mass Index: 52.04 kg/m  Pulse: 113 (Regular)  BP: 128/82 (Sitting, Left Arm, Standard)  Physical Exam ( General Note: obese white male with a long gray hair and centripetal obesity. HEENT exam unremarkable. Neck supple. Chest clear heart irregularly irregular rhythm rate controlled abdomen protuberant abdomen with centripetal obesity. Extremity exam with pain of gout in his arms. Alert and oriented   Assessment & Plan [place](Jeffey Janssen B. Daphine DeutscherMartin MD; 10/03/2017 12:25 PM) MORBID OBESITY, UNSPECIFIED OBESITY TYPE (E66.01) Impression: BMi 55 wm who has demonstrated ability to lose weight but with weight regain. he is benefits workup for 11 months and has a small hiatal hernia which may need to be repaired at the time of his sleeve gastrectomy. He has lost weight down to 378. BMI of 52. Plan schedule sleeve gastrectomy when approved by insurance

## 2017-11-18 ENCOUNTER — Other Ambulatory Visit: Payer: Self-pay

## 2017-11-18 ENCOUNTER — Inpatient Hospital Stay (HOSPITAL_COMMUNITY): Payer: BLUE CROSS/BLUE SHIELD | Admitting: Certified Registered Nurse Anesthetist

## 2017-11-18 ENCOUNTER — Encounter (HOSPITAL_COMMUNITY)
Admission: RE | Disposition: A | Discharge status: Home / Self Care | Payer: Self-pay | Source: Ambulatory Visit | Attending: Surgery

## 2017-11-18 ENCOUNTER — Encounter (HOSPITAL_COMMUNITY): Payer: Self-pay | Admitting: Emergency Medicine

## 2017-11-18 ENCOUNTER — Inpatient Hospital Stay (HOSPITAL_COMMUNITY)
Admission: RE | Admit: 2017-11-18 | Discharge: 2017-11-19 | DRG: 621 | Disposition: A | Discharge status: Home / Self Care | Payer: BLUE CROSS/BLUE SHIELD | Source: Ambulatory Visit | Attending: Surgery | Admitting: Surgery

## 2017-11-18 DIAGNOSIS — M720 Palmar fascial fibromatosis [Dupuytren]: Secondary | ICD-10-CM | POA: Diagnosis present

## 2017-11-18 DIAGNOSIS — I10 Essential (primary) hypertension: Secondary | ICD-10-CM | POA: Diagnosis present

## 2017-11-18 DIAGNOSIS — I4891 Unspecified atrial fibrillation: Secondary | ICD-10-CM | POA: Diagnosis present

## 2017-11-18 DIAGNOSIS — Z79899 Other long term (current) drug therapy: Secondary | ICD-10-CM | POA: Diagnosis not present

## 2017-11-18 DIAGNOSIS — G4730 Sleep apnea, unspecified: Secondary | ICD-10-CM | POA: Diagnosis present

## 2017-11-18 DIAGNOSIS — Z7901 Long term (current) use of anticoagulants: Secondary | ICD-10-CM | POA: Diagnosis not present

## 2017-11-18 DIAGNOSIS — E119 Type 2 diabetes mellitus without complications: Secondary | ICD-10-CM | POA: Diagnosis present

## 2017-11-18 DIAGNOSIS — M109 Gout, unspecified: Secondary | ICD-10-CM | POA: Diagnosis present

## 2017-11-18 DIAGNOSIS — Z7984 Long term (current) use of oral hypoglycemic drugs: Secondary | ICD-10-CM | POA: Diagnosis not present

## 2017-11-18 DIAGNOSIS — Z87891 Personal history of nicotine dependence: Secondary | ICD-10-CM | POA: Diagnosis not present

## 2017-11-18 DIAGNOSIS — Z791 Long term (current) use of non-steroidal anti-inflammatories (NSAID): Secondary | ICD-10-CM | POA: Diagnosis not present

## 2017-11-18 DIAGNOSIS — Z6841 Body Mass Index (BMI) 40.0 and over, adult: Secondary | ICD-10-CM

## 2017-11-18 DIAGNOSIS — Z9884 Bariatric surgery status: Secondary | ICD-10-CM

## 2017-11-18 DIAGNOSIS — K5710 Diverticulosis of small intestine without perforation or abscess without bleeding: Secondary | ICD-10-CM | POA: Diagnosis present

## 2017-11-18 DIAGNOSIS — I482 Chronic atrial fibrillation, unspecified: Secondary | ICD-10-CM

## 2017-11-18 HISTORY — PX: LAPAROSCOPIC GASTRIC SLEEVE RESECTION: SHX5895

## 2017-11-18 LAB — CBC WITH DIFFERENTIAL/PLATELET
BASOS ABS: 0 10*3/uL (ref 0.0–0.1)
BASOS PCT: 0 %
EOS ABS: 0.2 10*3/uL (ref 0.0–0.7)
Eosinophils Relative: 2 %
HCT: 41.5 % (ref 39.0–52.0)
HEMOGLOBIN: 13.9 g/dL (ref 13.0–17.0)
Lymphocytes Relative: 17 %
Lymphs Abs: 1.5 10*3/uL (ref 0.7–4.0)
MCH: 30.2 pg (ref 26.0–34.0)
MCHC: 33.5 g/dL (ref 30.0–36.0)
MCV: 90 fL (ref 78.0–100.0)
MONOS PCT: 7 %
Monocytes Absolute: 0.7 10*3/uL (ref 0.1–1.0)
NEUTROS PCT: 74 %
Neutro Abs: 6.6 10*3/uL (ref 1.7–7.7)
Platelets: 149 10*3/uL — ABNORMAL LOW (ref 150–400)
RBC: 4.61 MIL/uL (ref 4.22–5.81)
RDW: 15.4 % (ref 11.5–15.5)
WBC: 9 10*3/uL (ref 4.0–10.5)

## 2017-11-18 LAB — POCT INR: INR: 1.15

## 2017-11-18 LAB — COMPREHENSIVE METABOLIC PANEL
ALBUMIN: 4.4 g/dL (ref 3.5–5.0)
ALT: 22 U/L (ref 17–63)
ANION GAP: 10 (ref 5–15)
AST: 29 U/L (ref 15–41)
Alkaline Phosphatase: 35 U/L — ABNORMAL LOW (ref 38–126)
BUN: 60 mg/dL — ABNORMAL HIGH (ref 6–20)
CO2: 31 mmol/L (ref 22–32)
Calcium: 10.5 mg/dL — ABNORMAL HIGH (ref 8.9–10.3)
Chloride: 95 mmol/L — ABNORMAL LOW (ref 101–111)
Creatinine, Ser: 1.79 mg/dL — ABNORMAL HIGH (ref 0.61–1.24)
GFR calc Af Amer: 46 mL/min — ABNORMAL LOW (ref >60)
GFR calc non Af Amer: 39 mL/min — ABNORMAL LOW (ref >60)
GLUCOSE: 96 mg/dL (ref 65–99)
POTASSIUM: 3.5 mmol/L (ref 3.5–5.1)
Sodium: 136 mmol/L (ref 135–145)
Total Bilirubin: 1 mg/dL (ref 0.3–1.2)
Total Protein: 7.2 g/dL (ref 6.5–8.1)

## 2017-11-18 LAB — GLUCOSE, CAPILLARY
GLUCOSE-CAPILLARY: 175 mg/dL — AB (ref 65–99)
Glucose-Capillary: 91 mg/dL (ref 65–99)
Glucose-Capillary: 146 mg/dL — ABNORMAL HIGH (ref 65–99)

## 2017-11-18 LAB — HEMOGLOBIN A1C
HEMOGLOBIN A1C: 6 % — AB (ref 4.8–5.6)
MEAN PLASMA GLUCOSE: 125.5 mg/dL

## 2017-11-18 LAB — PROTIME-INR
INR: 1.15
Prothrombin Time: 14.6 seconds (ref 11.4–15.2)

## 2017-11-18 LAB — HEMOGLOBIN AND HEMATOCRIT, BLOOD
HCT: 42.2 % (ref 39.0–52.0)
HEMOGLOBIN: 14.1 g/dL (ref 13.0–17.0)

## 2017-11-18 SURGERY — GASTRECTOMY, SLEEVE, LAPAROSCOPIC
Anesthesia: General | Site: Abdomen

## 2017-11-18 MED ORDER — PROPOFOL 10 MG/ML IV BOLUS
INTRAVENOUS | Status: AC
  Filled 2017-11-18: qty 40

## 2017-11-18 MED ORDER — OXYCODONE HCL 5 MG/5ML PO SOLN: 5.0000 mg | ORAL | Status: DC | PRN

## 2017-11-18 MED ORDER — SCOPOLAMINE 1 MG/3DAYS TD PT72
1.0000 | MEDICATED_PATCH | TRANSDERMAL | Status: DC
  Administered 2017-11-18: 1.5 mg via TRANSDERMAL
  Filled 2017-11-18: qty 1

## 2017-11-18 MED ORDER — PHENYLEPHRINE 40 MCG/ML (10ML) SYRINGE FOR IV PUSH (FOR BLOOD PRESSURE SUPPORT)
PREFILLED_SYRINGE | INTRAVENOUS | Status: AC
  Filled 2017-11-18: qty 10

## 2017-11-18 MED ORDER — FENTANYL CITRATE (PF) 100 MCG/2ML IJ SOLN
INTRAMUSCULAR | Status: AC
  Filled 2017-11-18: qty 2

## 2017-11-18 MED ORDER — OXYCODONE HCL 5 MG/5ML PO SOLN: 5.0000 mg | Freq: Once | ORAL | Status: DC | PRN

## 2017-11-18 MED ORDER — APREPITANT 40 MG PO CAPS
40.0000 mg | ORAL_CAPSULE | ORAL | Status: AC
  Administered 2017-11-18: 40 mg via ORAL
  Filled 2017-11-18: qty 1

## 2017-11-18 MED ORDER — EPHEDRINE 5 MG/ML INJ
INTRAVENOUS | Status: AC
  Filled 2017-11-18: qty 10

## 2017-11-18 MED ORDER — LIDOCAINE 2% (20 MG/ML) 5 ML SYRINGE
INTRAMUSCULAR | Status: DC | PRN
  Administered 2017-11-18: 60 mg via INTRAVENOUS

## 2017-11-18 MED ORDER — 0.9 % SODIUM CHLORIDE (POUR BTL) OPTIME
TOPICAL | Status: DC | PRN
  Administered 2017-11-18: 1000 mL

## 2017-11-18 MED ORDER — CHLORHEXIDINE GLUCONATE CLOTH 2 % EX PADS: 6.0000 | MEDICATED_PAD | Freq: Once | CUTANEOUS | Status: DC

## 2017-11-18 MED ORDER — FENTANYL CITRATE (PF) 100 MCG/2ML IJ SOLN
25.0000 ug | INTRAMUSCULAR | Status: DC | PRN
  Administered 2017-11-18: 25 ug via INTRAVENOUS
  Administered 2017-11-18: 50 ug via INTRAVENOUS

## 2017-11-18 MED ORDER — LACTATED RINGERS IR SOLN
Status: DC | PRN
  Administered 2017-11-18: 1000 mL

## 2017-11-18 MED ORDER — GLYCOPYRROLATE 0.2 MG/ML IJ SOLN
INTRAMUSCULAR | Status: DC | PRN
  Administered 2017-11-18: 0.2 mg via INTRAVENOUS

## 2017-11-18 MED ORDER — CELECOXIB 200 MG PO CAPS
400.0000 mg | ORAL_CAPSULE | ORAL | Status: AC
  Administered 2017-11-18: 400 mg via ORAL
  Filled 2017-11-18: qty 2

## 2017-11-18 MED ORDER — KETAMINE HCL 10 MG/ML IJ SOLN
INTRAMUSCULAR | Status: AC
  Filled 2017-11-18: qty 1

## 2017-11-18 MED ORDER — DEXAMETHASONE SODIUM PHOSPHATE 10 MG/ML IJ SOLN
INTRAMUSCULAR | Status: AC
  Filled 2017-11-18: qty 1

## 2017-11-18 MED ORDER — LACTATED RINGERS IV SOLN
INTRAVENOUS | Status: DC
  Administered 2017-11-18 (×3): via INTRAVENOUS

## 2017-11-18 MED ORDER — INSULIN ASPART 100 UNIT/ML ~~LOC~~ SOLN
0.0000 [IU] | SUBCUTANEOUS | Status: DC
  Administered 2017-11-18 – 2017-11-19 (×3): 4 [IU] via SUBCUTANEOUS
  Administered 2017-11-19: 7 [IU] via SUBCUTANEOUS

## 2017-11-18 MED ORDER — ONDANSETRON HCL 4 MG/2ML IJ SOLN
INTRAMUSCULAR | Status: DC | PRN
  Administered 2017-11-18: 4 mg via INTRAVENOUS

## 2017-11-18 MED ORDER — ROCURONIUM BROMIDE 50 MG/5ML IV SOSY
PREFILLED_SYRINGE | INTRAVENOUS | Status: AC
  Filled 2017-11-18: qty 5

## 2017-11-18 MED ORDER — PROPOFOL 10 MG/ML IV BOLUS
INTRAVENOUS | Status: DC | PRN
  Administered 2017-11-18: 260 mg via INTRAVENOUS

## 2017-11-18 MED ORDER — LIDOCAINE 2% (20 MG/ML) 5 ML SYRINGE
INTRAMUSCULAR | Status: DC | PRN
  Administered 2017-11-18: 1.5 mg/kg/h via INTRAVENOUS

## 2017-11-18 MED ORDER — EPHEDRINE SULFATE-NACL 50-0.9 MG/10ML-% IV SOSY
PREFILLED_SYRINGE | INTRAVENOUS | Status: DC | PRN
  Administered 2017-11-18 (×6): 5 mg via INTRAVENOUS

## 2017-11-18 MED ORDER — CEFOTETAN DISODIUM-DEXTROSE 2-2.08 GM-%(50ML) IV SOLR
2.0000 g | INTRAVENOUS | Status: AC
  Administered 2017-11-18: 2 g via INTRAVENOUS
  Filled 2017-11-18: qty 50

## 2017-11-18 MED ORDER — LACTATED RINGERS IV SOLN
INTRAVENOUS | Status: DC | PRN
  Administered 2017-11-18: 14:00:00 via INTRAVENOUS

## 2017-11-18 MED ORDER — KETAMINE HCL 10 MG/ML IJ SOLN
INTRAMUSCULAR | Status: DC | PRN
  Administered 2017-11-18 (×4): 10 mg via INTRAVENOUS

## 2017-11-18 MED ORDER — PREMIER PROTEIN SHAKE
2.0000 [oz_av] | ORAL | Status: DC
  Administered 2017-11-19 (×5): 2 [oz_av] via ORAL

## 2017-11-18 MED ORDER — PHENYLEPHRINE HCL 10 MG/ML IJ SOLN
INTRAMUSCULAR | Status: DC | PRN
  Administered 2017-11-18: 100 ug/min via INTRAVENOUS

## 2017-11-18 MED ORDER — HYDROMORPHONE HCL 1 MG/ML IJ SOLN: 0.2500 mg | INTRAMUSCULAR | Status: DC | PRN

## 2017-11-18 MED ORDER — PHENYLEPHRINE 40 MCG/ML (10ML) SYRINGE FOR IV PUSH (FOR BLOOD PRESSURE SUPPORT)
PREFILLED_SYRINGE | INTRAVENOUS | Status: DC | PRN
  Administered 2017-11-18 (×4): 80 ug via INTRAVENOUS
  Administered 2017-11-18: 120 ug via INTRAVENOUS

## 2017-11-18 MED ORDER — ENOXAPARIN SODIUM 30 MG/0.3ML ~~LOC~~ SOLN
30.0000 mg | Freq: Two times a day (BID) | SUBCUTANEOUS | Status: DC
  Administered 2017-11-19: 30 mg via SUBCUTANEOUS
  Filled 2017-11-18: qty 0.3

## 2017-11-18 MED ORDER — MORPHINE SULFATE (PF) 2 MG/ML IV SOLN: 1.0000 mg | INTRAVENOUS | Status: DC | PRN

## 2017-11-18 MED ORDER — ONDANSETRON HCL 4 MG/2ML IJ SOLN
INTRAMUSCULAR | Status: AC
  Filled 2017-11-18: qty 2

## 2017-11-18 MED ORDER — FENTANYL CITRATE (PF) 100 MCG/2ML IJ SOLN
INTRAMUSCULAR | Status: DC | PRN
  Administered 2017-11-18 (×2): 50 ug via INTRAVENOUS
  Administered 2017-11-18: 100 ug via INTRAVENOUS

## 2017-11-18 MED ORDER — PROMETHAZINE HCL 25 MG/ML IJ SOLN: 6.2500 mg | INTRAMUSCULAR | Status: DC | PRN

## 2017-11-18 MED ORDER — MIDAZOLAM HCL 5 MG/5ML IJ SOLN
INTRAMUSCULAR | Status: DC | PRN
  Administered 2017-11-18: 2 mg via INTRAVENOUS

## 2017-11-18 MED ORDER — ALBUMIN HUMAN 5 % IV SOLN
INTRAVENOUS | Status: DC | PRN
  Administered 2017-11-18: 14:00:00 via INTRAVENOUS

## 2017-11-18 MED ORDER — BUPIVACAINE LIPOSOME 1.3 % IJ SUSP
20.0000 mL | Freq: Once | INTRAMUSCULAR | Status: AC
  Administered 2017-11-18: 20 mL
  Filled 2017-11-18: qty 20

## 2017-11-18 MED ORDER — SUCCINYLCHOLINE CHLORIDE 200 MG/10ML IV SOSY
PREFILLED_SYRINGE | INTRAVENOUS | Status: AC
  Filled 2017-11-18: qty 10

## 2017-11-18 MED ORDER — LIDOCAINE 2% (20 MG/ML) 5 ML SYRINGE
INTRAMUSCULAR | Status: AC
  Filled 2017-11-18: qty 5

## 2017-11-18 MED ORDER — ROCURONIUM BROMIDE 10 MG/ML (PF) SYRINGE
PREFILLED_SYRINGE | INTRAVENOUS | Status: DC | PRN
  Administered 2017-11-18: 20 mg via INTRAVENOUS
  Administered 2017-11-18: 10 mg via INTRAVENOUS
  Administered 2017-11-18: 50 mg via INTRAVENOUS
  Administered 2017-11-18: 20 mg via INTRAVENOUS
  Administered 2017-11-18: 10 mg via INTRAVENOUS

## 2017-11-18 MED ORDER — DEXAMETHASONE SODIUM PHOSPHATE 4 MG/ML IJ SOLN
4.0000 mg | INTRAMUSCULAR | Status: AC
  Administered 2017-11-18: 10 mg via INTRAVENOUS

## 2017-11-18 MED ORDER — MIDAZOLAM HCL 2 MG/2ML IJ SOLN
INTRAMUSCULAR | Status: AC
  Filled 2017-11-18: qty 2

## 2017-11-18 MED ORDER — ACETAMINOPHEN 160 MG/5ML PO SOLN: 650.0000 mg | ORAL | Status: DC | PRN

## 2017-11-18 MED ORDER — LIDOCAINE HCL 2 % IJ SOLN
INTRAMUSCULAR | Status: AC
  Filled 2017-11-18: qty 20

## 2017-11-18 MED ORDER — KCL IN DEXTROSE-NACL 20-5-0.45 MEQ/L-%-% IV SOLN
INTRAVENOUS | Status: DC
  Administered 2017-11-18: 18:00:00 via INTRAVENOUS
  Filled 2017-11-18 (×2): qty 1000

## 2017-11-18 MED ORDER — HEPARIN SODIUM (PORCINE) 5000 UNIT/ML IJ SOLN
5000.0000 [IU] | INTRAMUSCULAR | Status: AC
  Administered 2017-11-18: 5000 [IU] via SUBCUTANEOUS
  Filled 2017-11-18: qty 1

## 2017-11-18 MED ORDER — OXYCODONE HCL 5 MG PO TABS: 5.0000 mg | ORAL_TABLET | Freq: Once | ORAL | Status: DC | PRN

## 2017-11-18 MED ORDER — PROCHLORPERAZINE EDISYLATE 5 MG/ML IJ SOLN: 5.0000 mg | INTRAMUSCULAR | Status: DC | PRN

## 2017-11-18 MED ORDER — SUCCINYLCHOLINE CHLORIDE 200 MG/10ML IV SOSY
PREFILLED_SYRINGE | INTRAVENOUS | Status: DC | PRN
  Administered 2017-11-18: 150 mg via INTRAVENOUS

## 2017-11-18 MED ORDER — SODIUM CHLORIDE 0.9 % IJ SOLN
INTRAMUSCULAR | Status: AC
  Filled 2017-11-18: qty 10

## 2017-11-18 MED ORDER — GABAPENTIN 300 MG PO CAPS
300.0000 mg | ORAL_CAPSULE | ORAL | Status: AC
  Administered 2017-11-18: 300 mg via ORAL
  Filled 2017-11-18: qty 1

## 2017-11-18 MED ORDER — ACETAMINOPHEN 500 MG PO TABS
1000.0000 mg | ORAL_TABLET | ORAL | Status: AC
  Administered 2017-11-18: 1000 mg via ORAL
  Filled 2017-11-18: qty 2

## 2017-11-18 MED ORDER — PHENYLEPHRINE HCL 10 MG/ML IJ SOLN
INTRAMUSCULAR | Status: AC
  Filled 2017-11-18: qty 2

## 2017-11-18 MED ORDER — SODIUM CHLORIDE 0.9 % IJ SOLN
INTRAMUSCULAR | Status: DC | PRN
  Administered 2017-11-18: 10 mL

## 2017-11-18 SURGICAL SUPPLY — 62 items
ADH SKN CLS APL DERMABOND .7 (GAUZE/BANDAGES/DRESSINGS) ×1
APPLICATOR COTTON TIP 6IN STRL (MISCELLANEOUS) IMPLANT
APPLIER CLIP 5 13 M/L LIGAMAX5 (MISCELLANEOUS)
APPLIER CLIP ROT 10 11.4 M/L (STAPLE)
APPLIER CLIP ROT 13.4 12 LRG (CLIP)
APR CLP LRG 13.4X12 ROT 20 MLT (CLIP)
APR CLP MED LRG 11.4X10 (STAPLE)
APR CLP MED LRG 5 ANG JAW (MISCELLANEOUS)
BLADE SURG 15 STRL LF DISP TIS (BLADE) ×1 IMPLANT
BLADE SURG 15 STRL SS (BLADE) ×2
CABLE HIGH FREQUENCY MONO STRZ (ELECTRODE) ×2 IMPLANT
CLIP APPLIE 5 13 M/L LIGAMAX5 (MISCELLANEOUS) IMPLANT
CLIP APPLIE ROT 10 11.4 M/L (STAPLE) IMPLANT
CLIP APPLIE ROT 13.4 12 LRG (CLIP) IMPLANT
DERMABOND ADVANCED (GAUZE/BANDAGES/DRESSINGS) ×1
DERMABOND ADVANCED .7 DNX12 (GAUZE/BANDAGES/DRESSINGS) IMPLANT
DEVICE PMI PUNCTURE CLOSURE (MISCELLANEOUS) ×2 IMPLANT
DEVICE SUT QUICK LOAD TK 5 (STAPLE) IMPLANT
DEVICE SUT TI-KNOT TK 5X26 (MISCELLANEOUS) IMPLANT
DEVICE SUTURE ENDOST 10MM (ENDOMECHANICALS) IMPLANT
DISSECTOR BLUNT TIP ENDO 5MM (MISCELLANEOUS) IMPLANT
ELECT REM PT RETURN 15FT ADLT (MISCELLANEOUS) ×2 IMPLANT
GAUZE SPONGE 4X4 12PLY STRL (GAUZE/BANDAGES/DRESSINGS) IMPLANT
GLOVE BIOGEL M 8.0 STRL (GLOVE) ×2 IMPLANT
GOWN STRL REUS W/TWL XL LVL3 (GOWN DISPOSABLE) ×9 IMPLANT
HANDLE STAPLE EGIA 4 XL (STAPLE) ×2 IMPLANT
HOVERMATT SINGLE USE (MISCELLANEOUS) ×2 IMPLANT
KIT BASIN OR (CUSTOM PROCEDURE TRAY) ×2 IMPLANT
MARKER SKIN DUAL TIP RULER LAB (MISCELLANEOUS) ×2 IMPLANT
NDL SPNL 22GX3.5 QUINCKE BK (NEEDLE) ×1 IMPLANT
NEEDLE SPNL 22GX3.5 QUINCKE BK (NEEDLE) ×2 IMPLANT
PACK UNIVERSAL I (CUSTOM PROCEDURE TRAY) ×2 IMPLANT
RELOAD STAPLE 45 PURP MED/THCK (STAPLE) IMPLANT
RELOAD TRI 45 ART MED THCK BLK (STAPLE) ×2 IMPLANT
RELOAD TRI 45 ART MED THCK PUR (STAPLE) ×2 IMPLANT
RELOAD TRI 60 ART MED THCK BLK (STAPLE) ×3 IMPLANT
RELOAD TRI 60 ART MED THCK PUR (STAPLE) ×4 IMPLANT
SCISSORS LAP 5X45 EPIX DISP (ENDOMECHANICALS) IMPLANT
SET BI-LUMEN FLTR TB AIRSEAL (TUBING) ×1 IMPLANT
SET IRRIG TUBING LAPAROSCOPIC (IRRIGATION / IRRIGATOR) ×2 IMPLANT
SHEARS HARMONIC ACE PLUS 45CM (MISCELLANEOUS) ×2 IMPLANT
SLEEVE ADV FIXATION 5X100MM (TROCAR) ×4 IMPLANT
SLEEVE GASTRECTOMY 36FR VISIGI (MISCELLANEOUS) ×2 IMPLANT
SOLUTION ANTI FOG 6CC (MISCELLANEOUS) ×2 IMPLANT
SPONGE LAP 18X18 X RAY DECT (DISPOSABLE) ×2 IMPLANT
STAPLER VISISTAT 35W (STAPLE) ×2 IMPLANT
SUT MNCRL AB 4-0 PS2 18 (SUTURE) ×3 IMPLANT
SUT SURGIDAC NAB ES-9 0 48 120 (SUTURE) IMPLANT
SUT VIC AB 4-0 SH 18 (SUTURE) ×2 IMPLANT
SUT VICRYL 0 TIES 12 18 (SUTURE) ×2 IMPLANT
SYR 10ML ECCENTRIC (SYRINGE) ×2 IMPLANT
SYR 20CC LL (SYRINGE) ×2 IMPLANT
SYR 50ML LL SCALE MARK (SYRINGE) ×2 IMPLANT
TOWEL OR 17X26 10 PK STRL BLUE (TOWEL DISPOSABLE) ×4 IMPLANT
TOWEL OR NON WOVEN STRL DISP B (DISPOSABLE) ×2 IMPLANT
TRAY FOLEY W/METER SILVER 16FR (SET/KITS/TRAYS/PACK) IMPLANT
TROCAR ADV FIXATION 5X100MM (TROCAR) ×2 IMPLANT
TROCAR BLADELESS 15MM (ENDOMECHANICALS) ×2 IMPLANT
TROCAR BLADELESS OPT 5 100 (ENDOMECHANICALS) ×2 IMPLANT
TUBE CALIBRATION LAPBAND (TUBING) ×1 IMPLANT
TUBING CONNECTING 10 (TUBING) ×3 IMPLANT
TUBING ENDO SMARTCAP (MISCELLANEOUS) ×2 IMPLANT

## 2017-11-18 NOTE — Anesthesia Postprocedure Evaluation (Signed)
Anesthesia Post Note  [facility]  Procedure(s) Performed: LAPAROSCOPIC GASTRIC SLEEVE RESECTION WITH UPPER ENDOSCOPY (N/A Abdomen)     Patient location during evaluation: PACU Anesthesia Type: General Level of consciousness: awake and alert Pain management: pain level controlled Vital Signs Assessment: post-procedure vital signs reviewed and stable Respiratory status: spontaneous breathing, nonlabored ventilation, respiratory function stable and patient connected to nasal cannula oxygen Cardiovascular status: blood pressure returned to baseline and stable Postop Assessment: no apparent nausea or vomiting Anesthetic complications: no    Last Vitals:  Vitals:   11/18/17 1654 11/18/17 1709  BP: 121/80 (!) 138/96  Pulse: 70 66  Resp: 16 19  Temp: (!) 36.2 C   SpO2: 94% 98%    Last Pain:  Vitals:   11/18/17 1654  TempSrc:   PainSc: 3                  Shawne Eskelson S

## 2017-11-18 NOTE — Op Note (Signed)
Surgeon: Wenda LowMatt Idamay Hosein, MD, FACS  Asst:  Jaclynn GuarneriBen Hoxworth, MD, FACS  Anes:  General endotracheal  Procedure: Laparoscopic sleeve gastrectomy and upper endoscopy  Diagnosis: Morbid obesity  Complications: None noted  EBL:   10 cc  Description of Procedure:  The patient was take to OR 2 and given general anesthesia.  The abdomen was prepped with PCMX and draped sterilely.  A timeout was performed.  Access to the abdomen was achieved with a 5 mm Optiview thru the left upper quadrant.  Following insufflation, the state of the abdomen was found to be free of adhesions.  The balloon test was performed and there was no evidence of a hiatal hernia.  The ViSiGi 36Fr tube was inserted to deflate the stomach and was pulled back into the esophagus.    The pylorus was identified and we measured 5 cm back and marked the antrum.  At that point we began dissection to take down the greater curvature of the stomach using the Harmonic scalpel.  This dissection was taken all the way up to the left crus.  Posterior attachments of the stomach were also taken down.    The ViSiGi tube was then passed into the antrum and suction applied so that it was snug along the lessor curvature.  The "crow's foot" or incisura was identified.  The sleeve gastrectomy was begun using the [employer] platform stapler beginning with a 4.5 Black load with TRS and then a 6 cm black with TRS and then another 6 with TRS Black then the rest were purple loads with TRS.  When the sleeve was complete the tube was taken off suction and insufflated briefly.  The tube was withdrawn.  Upper endoscopy was then performed by Dr. Johna SheriffHoxworth;  No hiatal hernia, no bleeding.  cylinidrical sleeve without bubbles.     The specimen was extracted through the 15 trocar site.  Wounds were infiltrated with Exparel and closed with 4-0 Monocryl and Dermabond.  The 15 mm trocar on the right was closed with a 0 vicryl using the PMI device.    Matt B. Daphine DeutscherMartin, MD,  [facility], [place] [phone]

## 2017-11-18 NOTE — Transfer of Care (Signed)
Immediate Anesthesia Transfer of Care Note  Patient: Marco James  Procedure(s) Performed: LAPAROSCOPIC GASTRIC SLEEVE RESECTION WITH UPPER ENDOSCOPY (N/A Abdomen)  Patient Location: PACU  Anesthesia Type:General  Level of Consciousness: drowsy  Airway & Oxygen Therapy: Patient Spontanous Breathing and Patient connected to face mask  Post-op Assessment: Report given to RN and Post -op Vital signs reviewed and stable  Post vital signs: Reviewed and stable  Last Vitals:  Vitals:   11/18/17 1014  BP: 104/72  Pulse: 77  Resp: 20  Temp: 36.6 C  SpO2: 97%    Last Pain:  Vitals:   11/18/17 1014  TempSrc: Oral      Patients Stated Pain Goal: 4 (11/18/17 1042)  Complications: No apparent anesthesia complications

## 2017-11-18 NOTE — Anesthesia Preprocedure Evaluation (Signed)
Anesthesia Evaluation  Patient identified by MRN, date of birth, ID band Patient awake    Reviewed: Allergy & Precautions, H&P , NPO status , Patient's Chart, lab work & pertinent test results  Airway Mallampati: II   Neck ROM: full    Dental   Pulmonary shortness of breath, sleep apnea , former smoker,    breath sounds clear to auscultation       Cardiovascular hypertension, +CHF  + dysrhythmias Atrial Fibrillation  Rhythm:regular Rate:Normal     Neuro/Psych    GI/Hepatic GERD  ,(+)     substance abuse  ,   Endo/Other  diabetes, Type obesity  Renal/GU Renal InsufficiencyRenal disease     Musculoskeletal  (+) Arthritis ,   Abdominal   Peds  Hematology   Anesthesia Other Findings   Reproductive/Obstetrics                             Anesthesia Physical Anesthesia Plan  ASA: III  Anesthesia Plan: General   Post-op Pain Management:    Induction: Intravenous  PONV Risk Score and Plan: 2 and Ondansetron, Dexamethasone, Midazolam and Treatment may vary due to age or medical condition  Airway Management Planned: Oral ETT  Additional Equipment:   Intra-op Plan:   Post-operative Plan: Extubation in OR  Informed Consent: I have reviewed the patients History and Physical, chart, labs and discussed the procedure including the risks, benefits and alternatives for the proposed anesthesia with the patient or authorized representative who has indicated his/her understanding and acceptance.     Plan Discussed with: CRNA, Anesthesiologist and Surgeon  Anesthesia Plan Comments:         Anesthesia Quick Evaluation

## 2017-11-18 NOTE — Interval H&P Note (Signed)
History and Physical Interval Note:  11/18/2017 12:05 PM  Marco James  has presented today for surgery, with the diagnosis of MORBID OBESITY  The various methods of treatment have been discussed with the patient and family. After consideration of risks, benefits and other options for treatment, the patient has consented to  Procedure(s): LAPAROSCOPIC GASTRIC SLEEVE RESECTION WITH UPPER ENDO (N/A) as a surgical intervention .  The patient's history has been reviewed, patient examined, no change in status, stable for surgery.  I have reviewed the patient's chart and labs.  Questions were answered to the patient's satisfaction.     Valarie MerinoMatthew B Eiman Maret

## 2017-11-18 NOTE — Anesthesia Procedure Notes (Signed)
Procedure Name: Intubation Date/Time: 11/18/2017 12:50 PM Performed by: Vanessa Durhamochran, Palak Tercero Glenn, CRNA Pre-anesthesia Checklist: Emergency Drugs available, Suction available, Patient identified and Patient being monitored Patient Re-evaluated:Patient Re-evaluated prior to induction Oxygen Delivery Method: Circle system utilized Preoxygenation: Pre-oxygenation with 100% oxygen Induction Type: IV induction Ventilation: Mask ventilation without difficulty Laryngoscope Size: Glidescope and 4 Grade View: Grade I Tube type: Oral Tube size: 7.5 mm Number of attempts: 1 Airway Equipment and Method: Video-laryngoscopy Placement Confirmation: ETT inserted through vocal cords under direct vision,  positive ETCO2 and breath sounds checked- equal and bilateral Tube secured with: Tape Dental Injury: Teeth and Oropharynx as per pre-operative assessment

## 2017-11-18 NOTE — Op Note (Signed)
Procedure: Upper GI endoscopy  Description of procedure: Upper GI endoscopy is performed at the completion of laparoscopic sleeve gastrectomy by Dr.  Daphine DeutscherMartin  The video endoscope was introduced into the upper esophagus and then passed to the EG junction at about 40 cm. The esophagus appeared normal. The gastric sleeve was entered. The sleeve was tensely distended with air while the outlet was obstructed under saline irrigation by the operating surgeon. There was no evidence of leak. The staple line was intact and without bleeding. The scope was advanced to the antrum and pylorus visualized. There was no stricture or twisting or mucosal abnormality, and particularly no narrowing noted at the incisura.  The pouch was then desufflated and the scope withdrawn.  Mariella SaaBenjamin T Elise Knobloch MD, FACS  11/18/2017, 3:42 PM

## 2017-11-18 NOTE — Discharge Instructions (Signed)
° ° ° °GASTRIC BYPASS/SLEEVE ° Home Care Instructions ° ° These instructions are to help you care for yourself when you go home. ° °Call: If you have any problems. °• Call [phone] and ask for the surgeon on call °• If you need immediate assistance come to the ER at [facility]. Tell the ER staff you are a new post-op gastric bypass or gastric sleeve patient  °Signs and symptoms to report: • Severe  vomiting or nausea °o If you cannot handle clear liquids for longer than 1 day, call your surgeon °• Abdominal pain which does not get better after taking your pain medication °• Fever greater than 100.4°  F and chills °• Heart rate over 100 beats a minute °• Trouble breathing °• Chest pain °• Redness,  swelling, drainage, or foul odor at incision (surgical) sites °• If your incisions open or pull apart °• Swelling or pain in calf (lower leg) °• Diarrhea (Loose bowel movements that happen often), frequent watery, uncontrolled bowel movements °• Constipation, (no bowel movements for 3 days) if this happens: °o Take Milk of Magnesia, 2 tablespoons by mouth, 3 times a day for 2 days if needed °o Stop taking Milk of Magnesia once you have had a bowel movement °o Call your doctor if constipation continues °Or °o Take Miralax  (instead of Milk of Magnesia) following the label instructions °o Stop taking Miralax once you have had a bowel movement °o Call your doctor if constipation continues °• Anything you think is “abnormal for you” °  °Normal side effects after surgery: • Unable to sleep at night or unable to concentrate °• Irritability °• Being tearful (crying) or depressed ° °These are common complaints, possibly related to your anesthesia, stress of surgery, and change in lifestyle, that usually go away a few weeks after surgery. If these feelings continue, call your medical doctor.  °Wound Care: You may have surgical glue, steri-strips, or staples over your incisions after surgery °• Surgical glue: Looks like clear  film over your incisions and will wear off a little at a time °• Steri-strips: Adhesive strips of tape over your incisions. You may notice a yellowish color on skin under the steri-strips. This is used to make the steri-strips stick better. Do not pull the steri-strips off - let them fall off °• Staples: Staples may be removed before you leave the hospital °o If you go home with staples, call [facility] for an appointment with your surgeon’s nurse to have staples removed 10 days after surgery, [phone] °• Showering: You may shower two (2) days after your surgery unless your surgeon tells you differently °o Wash gently around incisions with warm soapy water, rinse well, and gently pat dry °o If you have a drain (tube from your incision), you may need someone to hold this while you shower °o No tub baths until staples are removed and incisions are healed °  °Medications: • Medications should be liquid or crushed if larger than the size of a dime °• Extended release pills (medication that releases a little bit at a time through the  day) should not be crushed °• Depending on the size and number of medications you take, you may need to space (take a few throughout the day)/change the time you take your medications so that you do not over-fill your pouch (smaller stomach) °• Make sure you follow-up with you primary care physician to make medication changes needed during rapid weight loss and life -style changes °•   If you have diabetes, follow up with your doctor that orders your diabetes medication(s) within one week after surgery and check your blood sugar regularly ° °• Do not drive while taking narcotics (pain medications) ° °• Do not take acetaminophen (Tylenol) and Roxicet or Lortab Elixir at the same time since these pain medications contain acetaminophen °  °Diet:  °First 2 Weeks You will see the nutritionist about two (2) weeks after your surgery. The nutritionist will increase the types of  foods you can eat if you are handling liquids well: °• If you have severe vomiting or nausea and cannot handle clear liquids lasting longer than 1 day call your surgeon °Protein Shake °• Drink at least 2 ounces of shake 5-6 times per day °• Each serving of protein shakes (usually 8-12 ounces) should have a minimum of: °o 15 grams of protein °o And no more than 5 grams of carbohydrate °• Goal for protein each day: °o Men = 80 grams per day °o Women = 60 grams per day °  ° • Protein powder may be added to fluids such as non-fat milk or Lactaid milk or Soy milk (limit to 35 grams added protein powder per serving) ° °Hydration °• Slowly increase the amount of water and other clear liquids as tolerated (See Acceptable Fluids) °• Slowly increase the amount of protein shake as tolerated °• Sip fluids slowly and throughout the day °• May use sugar substitutes in small amounts (no more than 6-8 packets per day; i.e. Splenda) ° °Fluid Goal °• The first goal is to drink at least 8 ounces of protein shake/drink per day (or as directed by the nutritionist); some examples of protein shakes are Syntrax Nectar, Adkins Advantage, EAS Edge HP, and Unjury. - See handout from pre-op Bariatric Education Class: °o Slowly increase the amount of protein shake you drink as tolerated °o You may find it easier to slowly sip shakes throughout the day °o It is important to get your proteins in first °• Your fluid goal is to drink 64-100 ounces of fluid daily °o It may take a few weeks to build up to this  °• 32 oz. (or more) should be clear liquids °And °• 32 oz. (or more) should be full liquids (see below for examples) °• Liquids should not contain sugar, caffeine, or carbonation ° °Clear Liquids: °• Water of Sugar-free flavored water (i.e. Fruit H²O, Propel) °• Decaffeinated coffee or tea (sugar-free) °• Crystal lite, Wyler’s Lite, Minute Maid Lite °• Sugar-free Jell-O °• Bouillon or broth °• Sugar-free Popsicle:    - Less than 20 calories  each; Limit 1 per day ° °Full Liquids: °                  Protein Shakes/Drinks + 2 choices per day of other full liquids °• Full liquids must be: °o No More Than 12 grams of Carbs per serving °o No More Than 3 grams of Fat per serving °• Strained low-fat cream soup °• Non-Fat milk °• Fat-free Lactaid Milk °• Sugar-free yogurt (Dannon Lite & Fit, Greek yogurt) ° °  °Vitamins and Minerals • Start 1 day after surgery unless otherwise directed by your surgeon °• 2 Chewable Bariatric Multivitamin / Multimineral Supplement with iron °• Chewable Calcium Citrate with Vitamin D-3 °(Example: 3 Chewable Calcium  Plus 600 with Vitamin D-3) °o Take 500 mg three (3) times a day for a total of 1500 mg each day °o Do not take all 3 doses of calcium   at one time as it may cause constipation, and you can only absorb 500 mg at a time °o Do not mix multivitamins containing iron with calcium supplements;  take 2 hours apart °• Menstruating women and those at risk for anemia ( a blood disease that causes weakness) may need extra iron °o Talk to your doctor to see if you need more iron °• If you need extra iron: Total daily Iron recommendation (including Vitamins) is 50 to 100 mg Iron/day °• Do not stop taking or change any vitamins or minerals until you talk to your nutritionist or surgeon °• Your nutritionist and/or surgeon must approve all vitamin and mineral supplements °  °Activity and Exercise: It is important to continue walking at home. Limit your physical activity as instructed by your doctor. During this time, use these guidelines: °• Do not lift anything greater than ten  (10) pounds for at least two (2) weeks °• Do not go back to work or drive until your surgeon says you can °• You may have sex when you feel comfortable °o It is VERY important for male patients to use a reliable birth control method; fertility often increase after surgery °o Do not get pregnant for at least 18 months °• Start exercising as soon as your  doctor tells you that you can °o Make sure your doctor approves any physical activity °• Start with a simple walking program °• Walk 5-15 minutes each day, 7 days per week °• Slowly increase until you are walking 30-45 minutes per day °• Consider joining our BELT program. [phone] or email [email or web address] °  °Special Instructions Things to remember: °• Use your CPAP when sleeping if this applies to you °• Consider buying a medical alert bracelet that says you had lap-band surgery °  °  You will likely have your first fill (fluid added to your band) 6 - 8 weeks after surgery °• [facility] has a free Bariatric Surgery Support Group that meets monthly, the 3rd Thursday, 6pm. [facility] [facility] Classrooms. You can see classes online at [email or web address] °• It is very important to keep all follow up appointments with your surgeon, nutritionist, primary care physician, and behavioral health practitioner °o After the first year, please follow up with your bariatric surgeon and nutritionist at least once a year in order to maintain best weight loss results °      °             [facility]:  [phone] ° °             [facility]: [phone] ° °             Bariatric Nurse Coordinator: [phone]  °Gastric Bypass/Sleeve Home Care Instructions  Rev. 01/2013    ° °                                                    Reviewed and Endorsed °                                                   by [place] Patient Education Committee, Jan, 2014 ° ° ° ° ° ° ° ° ° °

## 2017-11-19 ENCOUNTER — Telehealth: Payer: Self-pay

## 2017-11-19 DIAGNOSIS — I482 Chronic atrial fibrillation, unspecified: Secondary | ICD-10-CM

## 2017-11-19 DIAGNOSIS — Z5181 Encounter for therapeutic drug level monitoring: Secondary | ICD-10-CM

## 2017-11-19 LAB — CBC WITH DIFFERENTIAL/PLATELET
BASOS ABS: 0 10*3/uL (ref 0.0–0.1)
BASOS PCT: 0 %
EOS PCT: 0 %
Eosinophils Absolute: 0 10*3/uL (ref 0.0–0.7)
HEMATOCRIT: 37.6 % — AB (ref 39.0–52.0)
Hemoglobin: 12.8 g/dL — ABNORMAL LOW (ref 13.0–17.0)
LYMPHS PCT: 5 %
Lymphs Abs: 0.6 10*3/uL — ABNORMAL LOW (ref 0.7–4.0)
MCH: 30.7 pg (ref 26.0–34.0)
MCHC: 34 g/dL (ref 30.0–36.0)
MCV: 90.2 fL (ref 78.0–100.0)
Monocytes Absolute: 0.3 10*3/uL (ref 0.1–1.0)
Monocytes Relative: 2 %
NEUTROS ABS: 11.3 10*3/uL — AB (ref 1.7–7.7)
Neutrophils Relative %: 93 %
PLATELETS: 127 10*3/uL — AB (ref 150–400)
RBC: 4.17 MIL/uL — AB (ref 4.22–5.81)
RDW: 15.5 % (ref 11.5–15.5)
WBC: 12.2 10*3/uL — AB (ref 4.0–10.5)

## 2017-11-19 LAB — GLUCOSE, CAPILLARY
GLUCOSE-CAPILLARY: 218 mg/dL — AB (ref 65–99)
Glucose-Capillary: 175 mg/dL — ABNORMAL HIGH (ref 65–99)
Glucose-Capillary: 176 mg/dL — ABNORMAL HIGH (ref 65–99)
Glucose-Capillary: 159 mg/dL — ABNORMAL HIGH (ref 65–99)

## 2017-11-19 MED ORDER — WARFARIN SODIUM 6 MG PO TABS: 9.0000 mg | ORAL_TABLET | Freq: Every day | ORAL | Status: DC

## 2017-11-19 MED ORDER — ENOXAPARIN SODIUM 40 MG/0.4ML ~~LOC~~ SOLN: 40.0000 mg | Freq: Two times a day (BID) | SUBCUTANEOUS | 0 refills | Status: DC

## 2017-11-19 MED ORDER — ENOXAPARIN (LOVENOX) PATIENT EDUCATION KIT: 1.0000 | PACK | Freq: Once | 0 refills | Status: AC

## 2017-11-19 MED ORDER — WARFARIN - PHYSICIAN DOSING INPATIENT: Freq: Every day | Status: DC

## 2017-11-19 MED ORDER — WARFARIN SODIUM 6 MG PO TABS: 6.0000 mg | ORAL_TABLET | Freq: Every day | ORAL | 0 refills | Status: DC

## 2017-11-19 MED ORDER — WARFARIN SODIUM 3 MG PO TABS: 9.0000 mg | ORAL_TABLET | Freq: Every day | ORAL | 0 refills | Status: DC

## 2017-11-19 MED ORDER — ENOXAPARIN (LOVENOX) PATIENT EDUCATION KIT
PACK | Freq: Once | Status: DC
  Filled 2017-11-19: qty 1

## 2017-11-19 MED ORDER — WARFARIN SODIUM 6 MG PO TABS: 6.0000 mg | ORAL_TABLET | Freq: Every day | ORAL | Status: DC

## 2017-11-19 MED ORDER — ENOXAPARIN SODIUM 40 MG/0.4ML ~~LOC~~ SOLN: 40.0000 mg | Freq: Two times a day (BID) | SUBCUTANEOUS | Status: DC

## 2017-11-19 NOTE — Progress Notes (Signed)
Syliva OvermanMandy Wagoner RN [facility] notified via voicemail of patient discharge plan to bridge with Lovenox through Sunday Dec 2, restart coumadin at 9 mg Thursday Nov 29 and decrease to 6 mg on Sunday Dec 2.  Patient has blood draw appointment Tues Dec 4.  Contact information provided should the clinic have questions or concerns

## 2017-11-19 NOTE — Discharge Summary (Signed)
Physician Discharge Summary  Patient ID: Marco James MRN: [id number] DOB/AGE: 1956-02-09 61 y.o.  Admit date: 11/18/2017 Discharge date: 11/19/2017  Admission Diagnoses:  Morbid obesity  Discharge Diagnoses:  same  Principal Problem:   S/P laparoscopic sleeve gastrectomyNov2018   Surgery:  Sleeve gastrectomy  Discharged Condition: improved  Hospital Course:   Had surgery on Monday.  Begun on liquids and was taking shakes on Tuesday.  Ready for discharge on Lovenox until Sunday and begin Coumadin 9 mg on Thursday and the down to 6 mg on Sunday.    Consults: pharmacy  Significant Diagnostic Studies: none    Discharge Exam: Blood pressure (!) 148/76, pulse 82, temperature 98.4 F (36.9 C), temperature source Oral, resp. rate 20, height 6' (1.829 m), weight (!) 168.9 kg (372 lb 6.4 oz), SpO2 96 %. Incisions OK.    Disposition: 01-Home or Self Care  Discharge Instructions    Ambulate hourly while awake   Complete by:  As directed    Call MD for:  difficulty breathing, headache or visual disturbances   Complete by:  As directed    Call MD for:  persistant dizziness or light-headedness   Complete by:  As directed    Call MD for:  persistant nausea and vomiting   Complete by:  As directed    Call MD for:  redness, tenderness, or signs of infection (pain, swelling, redness, odor or green/yellow discharge around incision site)   Complete by:  As directed    Call MD for:  severe uncontrolled pain   Complete by:  As directed    Call MD for:  temperature >101 F   Complete by:  As directed    Diet bariatric full liquid   Complete by:  As directed    Discharge instructions   Complete by:  As directed    Lovenox shots daily until Sunday;  Begin coumadin Thursday (9 mg) and reduce to 6 mg daily on  Sunday.   Incentive spirometry   Complete by:  As directed    Perform hourly while awake      Follow-up Information    [facility]. Go on 12/11/2017.    Specialty:  General Surgery Why:  at 9 with Yaakov GuthrieMatt Jaaron Oleson Contact information: [address] [place] [address] [phone]        [facility] Follow up.   Specialty:  General Surgery Contact information: [address] [place] [address] [phone]           Signed: Valarie MerinoMatthew B Lazaria Schaben 11/19/2017, 2:13 PM

## 2017-11-19 NOTE — Telephone Encounter (Signed)
Bariatric Coordinator calls to discuss patient's coumadin restart plan.  The surgeon is suggesting that rather than having patient restart coumadin today, they would like to put him on a lovenox and bridge him until actually starting the coumadin on Friday.    Surgeon would like your approval before moving forward on this plan.  I will communicate with Dawn your response and discuss bridge from there.  If we approve lovenox, should I keep him on lovenox until INR reaches a 2.0 or just stop the day of the coumadin restart?  Please advise.  Thanks.

## 2017-11-19 NOTE — Progress Notes (Signed)
Discharge instruction reviewed with patient and spouse, lovenox education discussed with patient and spouse, patient given prescription, instructed to take lovenox again tonight, patient verbalizes understanding of discharge plan Stanford BreedBracey, Roshni Burbano N RN 4:24 PM 11-19-2017

## 2017-11-19 NOTE — Progress Notes (Signed)
Patient alert and oriented, pain is controlled. Patient is tolerating fluids, advanced to protein shake today, patient is tolerating well.  Reviewed Gastric sleeve discharge instructions with patient and patient is able to articulate understanding.  Provided information on BELT program, Support Group and [facility] outpatient pharmacy. All questions answered, will continue to monitor.  

## 2017-11-19 NOTE — Progress Notes (Signed)
Patient alert and oriented, Post op day 1.  Provided support and encouragement.  Encouraged pulmonary toilet, ambulation and small sips of liquids. Completed 12 ounces of clear to begin protein shakes.  All questions answered.  Will continue to monitor.

## 2017-11-19 NOTE — Plan of Care (Signed)
Nutrition Education Note  Received consult for diet education per DROP protocol.   Discussed 2 week post op diet with pt. Emphasized that liquids must be non carbonated, non caffeinated, and sugar free. Fluid goals discussed. Pt to follow up with outpatient bariatric RD for further diet progression after 2 weeks. Multivitamins and minerals also reviewed. Teach back method used, pt expressed understanding, expect good compliance.   Diet: First 2 Weeks  You will see the nutritionist about two (2) weeks after your surgery. The nutritionist will increase the types of foods you can eat if you are handling liquids well:  If you have severe vomiting or nausea and cannot handle clear liquids lasting longer than 1 day, call your surgeon  Protein Shake  Drink at least 2 ounces of shake 5-6 times per day  Each serving of protein shakes (usually 8 - 12 ounces) should have a minimum of:  15 grams of protein  And no more than 5 grams of carbohydrate  Goal for protein each day:  Men = 80 grams per day  Women = 60 grams per day  Protein powder may be added to fluids such as non-fat milk or Lactaid milk or Soy milk (limit to 35 grams added protein powder per serving)   Hydration  Slowly increase the amount of water and other clear liquids as tolerated (See Acceptable Fluids)  Slowly increase the amount of protein shake as tolerated  Sip fluids slowly and throughout the day  May use sugar substitutes in small amounts (no more than 6 - 8 packets per day; i.e. Splenda)   Fluid Goal  The first goal is to drink at least 8 ounces of protein shake/drink per day (or as directed by the nutritionist); some examples of protein shakes are Premier Protein, Syntrax Nectar, Adkins Advantage, EAS Edge HP, and Unjury. See handout from pre-op Bariatric Education Class:  Slowly increase the amount of protein shake you drink as tolerated  You may find it easier to slowly sip shakes throughout the day  It is important to  get your proteins in first  Your fluid goal is to drink 64 - 100 ounces of fluid daily  It may take a few weeks to build up to this  32 oz (or more) should be clear liquids  And  32 oz (or more) should be full liquids (see below for examples)  Liquids should not contain sugar, caffeine, or carbonation   Clear Liquids:  Water or Sugar-free flavored water (i.e. Fruit H2O, Propel)  Decaffeinated coffee or tea (sugar-free)  Crystal Lite, Wyler?s Lite, Minute Maid Lite  Sugar-free Jell-O  Bouillon or broth  Sugar-free Popsicle: *Less than 20 calories each; Limit 1 per day   Full Liquids:  Protein Shakes/Drinks + 2 choices per day of other full liquids  Full liquids must be:  No More Than 12 grams of Carbs per serving  No More Than 3 grams of Fat per serving  Strained low-fat cream soup  Non-Fat milk  Fat-free Lactaid Milk  Sugar-free yogurt (Dannon Lite & Fit, Greek yogurt, Oikos Zero)   Timya Trimmer RD, LDN Clinical Nutrition Pager # - [phone]   

## 2017-11-19 NOTE — Telephone Encounter (Signed)
Patient is going to be discharged this pm.    Surgeon consulted with pharmacy and this is what they have ordered for the patient:  Lovenox bid starting today and last through Sunday PM 11/24/17 Patient is to start 9mg  of Coumadin on 11/29   and take 9mg  11/29, 11/30 and 12/1  then resume 6mg  daily on 12/2 Recheck INR here in clinic on 11/26/17.    1.  Please let me know if you have any concerns with this plan.  Patient is being discharged home on this regimen.  2. Also, care coordinator wants patient to be seen by MD on 12/4 to follow up on his BP/FLuid pill management following his surgery.  You currently do not have any openings but I didn't know if you wanted to add him to the end of your morning on 12/4.  Just let me know and I will adjust his INR appointment around you.    Thanks.

## 2017-11-20 NOTE — Telephone Encounter (Signed)
Was unable to leave a message for patient at home/mobile number due to mail box being full.  Patient has requested we mainly communicate via my chart message, so I have sent him a message with all f/u appt info.    Patient is to see Dr. Alphonsus SiasLetvak at 12 noon on 11/26/17 and I will perform INR check at that time.  He will stay on the clinic schedule for 3:30pm but it has been flagged that I will see him at his noon time appointment.

## 2017-11-20 NOTE — Telephone Encounter (Signed)
That sounds fine You can add him at 12PM on Tuesday 12/4---just make 15 minute appt

## 2017-11-22 ENCOUNTER — Telehealth (HOSPITAL_COMMUNITY): Payer: Self-pay

## 2017-11-22 NOTE — Telephone Encounter (Signed)
Follow up with bariatric surgical patient to discuss post discharge questions.  No answer at this time voicemail left for patient along with contact information to discuss the following questions.  1.  Are you having any pain not relieved by pain medication?pain medication  2.  How much fluid total fluid intake have you had in the last 24/48 hours?  72 ounces of fluid  3.  How much protein intake have you had in the last 24/48 hours?80 gram of protein  4.  Have you had any trouble making urine?yes  5.  Have you had nausea that has not been relieved by nausea medication?yes  6.  Are you ambulating every hour?moving about  7.  Are you passing gas or had a BM?yes  8.  Do you know how to contact [facility]? [facility]? [facility]?yes  9.  Are you taking your vitamins and calcium without difficulty?yes  10. Tell me how your incision looks?  Any redness, open incision, or drainage?look good

## 2017-11-26 ENCOUNTER — Encounter: Payer: Self-pay | Admitting: Internal Medicine

## 2017-11-26 ENCOUNTER — Ambulatory Visit (INDEPENDENT_AMBULATORY_CARE_PROVIDER_SITE_OTHER): Payer: BLUE CROSS/BLUE SHIELD

## 2017-11-26 ENCOUNTER — Ambulatory Visit (INDEPENDENT_AMBULATORY_CARE_PROVIDER_SITE_OTHER): Payer: BLUE CROSS/BLUE SHIELD | Admitting: Internal Medicine

## 2017-11-26 VITALS — BP 118/60 | HR 111 | Temp 97.9°F | Wt 360.0 lb

## 2017-11-26 DIAGNOSIS — Z5181 Encounter for therapeutic drug level monitoring: Secondary | ICD-10-CM

## 2017-11-26 DIAGNOSIS — I482 Chronic atrial fibrillation, unspecified: Secondary | ICD-10-CM

## 2017-11-26 DIAGNOSIS — M2550 Pain in unspecified joint: Secondary | ICD-10-CM | POA: Diagnosis not present

## 2017-11-26 LAB — POCT INR: INR: 1.6

## 2017-11-26 MED ORDER — TRAMADOL HCL 50 MG PO TABS: 50.0000 mg | ORAL_TABLET | Freq: Three times a day (TID) | ORAL | 0 refills | Status: DC | PRN

## 2017-11-26 NOTE — Patient Instructions (Signed)
INR today 1.6  Take 9mg  today (12/4) and then resume 6mg  daily Recheck in 1 week.    Patient stopped lovenox on Sunday 12/2 and took 9mg  on 11/29, 11/30, 12/1 and then 6mg  on 12/2, 12/3.  He is not feeling well postoperatively but denies any unusual bruising or bleeding.  Companion with patient today took written instructions for him and both were able to verbalize understanding and agreement with dosing plan and return appt for next week.

## 2017-11-26 NOTE — Progress Notes (Signed)
Subjective:    Patient ID: Marco James, male    DOB: 1956/08/25, 61 y.o.   MRN: [id number]  HPI Here due to increased pain since bariatric surgery Here with friend  Surgery went okay No complications Felt great right after the surgery--but has gotten worse and worse since 2 days after  Back "killing me"  Neck, wrist, feet,iknees etc No red swollen joints Reminds him of the gout--but in so many places  Has been taking the allopurinol Also one colchicine every day--not clearly helpful Hasn't tried tylenol  Current Outpatient Medications on File Prior to Visit  Medication Sig Dispense Refill  . allopurinol (ZYLOPRIM) 100 MG tablet Take 1 tablet (100 mg total) by mouth daily. 90 tablet 3  . allopurinol (ZYLOPRIM) 300 MG tablet Take 1 tablet (300 mg total) by mouth daily. 30 tablet 11  . colchicine 0.6 MG tablet Take 0.6 mg by mouth daily.     Marland Kitchen. doxazosin (CARDURA) 8 MG tablet Take 1 tablet (8 mg total) by mouth daily. (Patient taking differently: Take 8 mg 2 (two) times daily by mouth. ) 1 tablet 0  . furosemide (LASIX) 80 MG tablet Take 1 tablet (80 mg total) by mouth daily. 90 tablet 3  . glipiZIDE (GLUCOTROL) 10 MG tablet Take 1 tablet (10 mg total) by mouth 2 (two) times daily before a meal. 60 tablet 11  . lisinopril (PRINIVIL,ZESTRIL) 40 MG tablet take 1 tablet by mouth once daily (Patient taking differently: Take 40 mg by mouth once daily) 90 tablet 0  . metoprolol tartrate (LOPRESSOR) 100 MG tablet take 1 tablet by mouth twice a day (Patient taking differently: Take 200 mg by mouth once daily) 180 tablet 3  . potassium chloride SA (K-DUR,KLOR-CON) 20 MEQ tablet Take 2 tablets (40 mEq total) by mouth 2 (two) times daily. 360 tablet 1  . warfarin (COUMADIN) 6 MG tablet Take 1 tablet (6 mg total) by mouth daily at 6 PM. 60 tablet 0  . metolazone (ZAROXOLYN) 5 MG tablet Take 1 tablet (5 mg total) by mouth as directed. Take 1/2 to 1 tablet as needed once daily (Patient taking  differently: Take 2.5 mg by mouth daily. ) 30 tablet 6  . warfarin (COUMADIN) 3 MG tablet Take 3 tablets (9 mg total) by mouth daily at 6 PM. (Patient not taking: Reported on 11/26/2017) 60 tablet 0   No current facility-administered medications on file prior to visit.     No Known Allergies  Past Medical History:  Diagnosis Date  . A-fib (HCC)   . Arthritis   . Cellulitis and abscess of leg 2009  . CHF (congestive heart failure) (HCC)   . Dupuytren's disease   . Dyspnea    at times  . Dysrhythmia   . External hemorrhoids   . Gout   . HTN (hypertension)   . Hyperlipidemia   . Hyperuricemia   . Obstructive sleep apnea    cpap  . Pneumonia   . Polysubstance abuse (HCC)   . Toenail fungus   . Type 2 diabetes mellitus (HCC)    type 2    Past Surgical History:  Procedure Laterality Date  . COLONOSCOPY WITH PROPOFOL N/A 10/25/2015   Procedure: COLONOSCOPY WITH PROPOFOL;  Surgeon: Midge Miniumarren Wohl, MD;  Location: [facility];  Service: Endoscopy;  Laterality: N/A;  . LAPAROSCOPIC GASTRIC SLEEVE RESECTION     11/18/17 Dr. Daphine DeutscherMartin  . LAPAROSCOPIC GASTRIC SLEEVE RESECTION N/A 11/18/2017   Procedure: LAPAROSCOPIC GASTRIC SLEEVE RESECTION WITH UPPER  ENDOSCOPY;  Surgeon: Luretha MurphyMartin, Matthew, MD;  Location: [facility];  Service: General;  Laterality: N/A;  . NASAL SINUS SURGERY    . WISDOM TOOTH EXTRACTION      Family History  Problem Relation Age of Onset  . Heart disease Father 977    Social History   Socioeconomic History  . Marital status: Divorced    Spouse name: Not on file  . Number of children: 1  . Years of education: Not on file  . Highest education level: Not on file  Social Needs  . Financial resource strain: Not on file  . Food insecurity - worry: Not on file  . Food insecurity - inability: Not on file  . Transportation needs - medical: Not on file  . Transportation needs - non-medical: Not on file  Occupational History  . Occupation: Toolmaker    Comment: self  employed  Tobacco Use  . Smoking status: Former Smoker    Packs/day: 0.50    Years: 25.00    Pack years: 12.50    Types: Cigarettes  . Smokeless tobacco: Former NeurosurgeonUser    Quit date: 11/08/1991  Substance and Sexual Activity  . Alcohol use: Yes    Comment: just occasional  . Drug use: Yes    Types: Marijuana    Comment: Pt. used cocaine for 3 years, marijuana--long ago  . Sexual activity: Not Currently  Other Topics Concern  . Not on file  Social History Narrative   [place]   Review of Systems No fever Appetite is not good Fills up quickly--drinking protein drink, broth, etc Has lost 12# Hard falling asleep--but then sleeps well    Objective:   Physical Exam  Constitutional: No distress.  Musculoskeletal:  Mild tenderness over lateral right malleolus--but otherwise no distinct joint swelling, redness or tenderness  Psychiatric: He has a normal mood and affect. His behavior is normal.          Assessment & Plan:

## 2017-11-26 NOTE — Assessment & Plan Note (Signed)
Fairly widespread but no findings to suggest this is gout Not sure why this is so bad since the surgery Would continue the gout meds as is Advised trying tylenol regularly Few tramadol for prn--especially at night

## 2017-11-26 NOTE — Patient Instructions (Signed)
Please take acetaminophen extended release 650mg  three or 4 times daily. Use the tramadol if pain is severe---especially at bedtime. If you are not improving in the next week or so, you should probably go back to Dr Kathi LudwigSyed

## 2017-11-27 NOTE — Progress Notes (Signed)
This encounter was created in error - please disregard.  This encounter was created in error - please disregard.

## 2017-12-03 ENCOUNTER — Encounter: Payer: BLUE CROSS/BLUE SHIELD | Attending: Surgery | Admitting: Skilled Nursing Facility1

## 2017-12-03 ENCOUNTER — Ambulatory Visit: Payer: BLUE CROSS/BLUE SHIELD

## 2017-12-03 DIAGNOSIS — I4891 Unspecified atrial fibrillation: Secondary | ICD-10-CM | POA: Insufficient documentation

## 2017-12-03 DIAGNOSIS — Z713 Dietary counseling and surveillance: Secondary | ICD-10-CM | POA: Diagnosis present

## 2017-12-03 DIAGNOSIS — I10 Essential (primary) hypertension: Secondary | ICD-10-CM | POA: Diagnosis not present

## 2017-12-03 DIAGNOSIS — N183 Chronic kidney disease, stage 3 unspecified: Secondary | ICD-10-CM

## 2017-12-04 ENCOUNTER — Telehealth: Payer: Self-pay

## 2017-12-04 ENCOUNTER — Encounter: Payer: Self-pay | Admitting: Skilled Nursing Facility1

## 2017-12-04 NOTE — Telephone Encounter (Signed)
Patient's caregiver walks in to schedule coumadin clinic appointment for tomorrow and to get a message to Dr. Alphonsus SiasLetvak.  Caregiver states that patient is in tremendous amounts of pain as his right ankle/foot is now twice the size of the left. He cannot put any weight on it and is miserable and getting depressed.  Caregiver states this has been ongoing since right before the surgery and is getting worse because he can't take his medication.  He is starting to eat now but mobility is non-existent and caregiver is having a very hard time providing him assistance.  They feel something needs to be done to help him.     He saw bariatric clinic yesterday and they make note of a significant gout flare but I don't see where they are doing anything about it and there are no further comments regarding treatment.  He does not see them again until the end of January and patient is desperate for help and relief.  Dr. Alphonsus SiasLetvak,  Can you offer any advice or help with patient's condition?

## 2017-12-04 NOTE — Progress Notes (Signed)
Bariatric Class:  Appt start time: 1530 end time:  1630.  2 Week Post-Operative Nutrition Class  Patient was seen on 12/03/2017 for Post-[facility]ative Nutrition education [facility].   Pt arrives with a severe gouty flare up.   Surgery date: 11/18/2017 Surgery type: Sleeve Start weight at [facility]: 391 Weight today: 361  The following the learning objectives were met by the patient during this course:  Identifies Phase 3A (Soft, High Proteins) Dietary Goals and will begin from 2 weeks post-operatively to 2 months post-operatively  Identifies appropriate sources of fluids and proteins   States protein recommendations and appropriate sources post-operatively  Identifies the need for appropriate texture modifications, mastication, and bite sizes when consuming solids  Identifies appropriate multivitamin and calcium sources post-operatively  Describes the need for physical activity post-operatively and will follow MD recommendations  States when to call healthcare provider regarding medication questions or post-operative complications  Handouts given during class include:  Phase 3A: Soft, High Protein Diet Handout  Follow-Up Plan: Patient will follow-up at [facility] in 6 weeks for 2 month post-op nutrition visit for diet advancement per MD.

## 2017-12-05 ENCOUNTER — Ambulatory Visit (INDEPENDENT_AMBULATORY_CARE_PROVIDER_SITE_OTHER): Payer: BLUE CROSS/BLUE SHIELD

## 2017-12-05 ENCOUNTER — Other Ambulatory Visit: Payer: Self-pay | Admitting: Podiatry

## 2017-12-05 ENCOUNTER — Ambulatory Visit: Payer: Self-pay

## 2017-12-05 ENCOUNTER — Telehealth: Payer: Self-pay

## 2017-12-05 ENCOUNTER — Ambulatory Visit: Payer: BLUE CROSS/BLUE SHIELD | Admitting: Podiatry

## 2017-12-05 ENCOUNTER — Ambulatory Visit: Payer: BLUE CROSS/BLUE SHIELD

## 2017-12-05 ENCOUNTER — Encounter: Payer: Self-pay | Admitting: Podiatry

## 2017-12-05 DIAGNOSIS — R52 Pain, unspecified: Secondary | ICD-10-CM

## 2017-12-05 DIAGNOSIS — M10371 Gout due to renal impairment, right ankle and foot: Secondary | ICD-10-CM

## 2017-12-05 DIAGNOSIS — S92341A Displaced fracture of fourth metatarsal bone, right foot, initial encounter for closed fracture: Secondary | ICD-10-CM

## 2017-12-05 DIAGNOSIS — B351 Tinea unguium: Secondary | ICD-10-CM

## 2017-12-05 DIAGNOSIS — M79609 Pain in unspecified limb: Secondary | ICD-10-CM

## 2017-12-05 MED ORDER — PREDNISONE 20 MG PO TABS: ORAL_TABLET | ORAL | 0 refills | Status: DC

## 2017-12-05 NOTE — Telephone Encounter (Signed)
Copied from CRM [id number]. Topic: General - Other >> Dec 05, 2017 12:12 PM Marco James, Selina wrote: Reason for CRM: Pt is [facility] a [facility]. He would not elaborate on the reason for the call.

## 2017-12-05 NOTE — Telephone Encounter (Signed)
Patient coming in today at 1230 for coumadin visit.  I will see him at the car.    Patient has requested that Dr. Alphonsus SiasLetvak call him about the below situation.

## 2017-12-05 NOTE — Telephone Encounter (Addendum)
Patient notified and R/X sent in to pharmacy per MD's instructions.  Patient will call me in the am to let me know if he can make it to rescheduled coumadin check for 12/06/17.    Patient states that he is able to take medications and will restart colchicine bid per MD instructions.  MD is aware that patient was seen by podiatry today and was given cortisone shot and medication for pain.  Podiatrist sent note in Epic to MD with visit info.  Patient dx with gout flare and fractures in his foot.

## 2017-12-05 NOTE — Progress Notes (Signed)
This patient presents the office with chief complaint of severe pain noted in his right ankle, which is making walking difficult.  He says that his right ankle has become painful and swollen approximately 3 days after his sleeve  surgery for his weight.   His sleeve surgery was performed on 11/26.  He says he called his surgical doctor who told him that unless it was related to his surgery that he does not treat that condition.  He then says that he was seen Monday by his medical doctor who examined his foot but did not take any x-rays to determine the cause of his pain.  He presents the office today stating that the pain has continued to be painful and he desires an evaluation and treatment of his painful right ankle.  He also says he is presenting to the office today for preventative foot care services for his long thick painful nails. He has a previous history of gout, kidney disease and diabetes with numbness of his feet.  General Appearance  Alert, conversant and in no acute stress.  Vascular  Dorsalis pedis and posterior pulses are palpable  bilaterally.  Capillary return is within normal limits  Bilaterally. Temperature is within normal limits  Bilaterally  Neurologic  Senn-Weinstein monofilament wire test   absent right foot.. Muscle power  Within normal limits bilaterally.  Nails Thick disfigured discolored nails with subungual debris bilaterally from hallux to fifth toes bilaterally. No evidence of bacterial infection or drainage bilaterally.  Orthopedic  . Evaluation of his right foot and ankle reveals severe palpable pain noted over the lateral malleolus of the right ankle  . No pain noted along the course of the peroneal tendon.  This patient appears to be guarding preventing full range of motion of his right foot and ankle.  No pain to the sinus tarsi of the right ankle.  Patient does appear to have increased temperature and swelling noted on the dorsum of the right foot.  Minimal palpable  pain is noted. due to absence of LOPS.    Skin  normotropic skin with no porokeratosis noted bilaterally.  No signs of infections or ulcers noted.      Onychmycosis  B/L  Gout right ankle  Closed bone fractures right midfoot.  Possible early Charcot foot    ROV  Debridement of nails.  X-rays were taken of his ankle and reveal no evidence of any pathology to the ankle.  X-rays were taken of the right foot and they reveal a fracture in the cuboid of the right foot as well as the base of the third and fourth metatarsals of the right foot.   Genreralized midfoot arthritis.  Calcification at the insertion of plantar fascia right foot.  Discussed this condition with this patient and informed him I believe he has 2 problems.  I believe he developed an arthritic condition probably gout following his sleeve surgery in November.  I also believe he has developed multiple fractures through the midfoot, which could be an indication of an early Charcot foot.  Due to his kidney condition  and the medication he was previously prescribed by his medical doctors, , I felt uncomfortable recommending any medication to help alleviate his pain.  I did provide injection therapy at the site of maximum pain.  He was also dispensed a cam walker through this office to help with his ambulation.  He says that he is immediately going to his medical doctor and I suggested he talk with his  doctor for medicine to help control his pain from  Gout.  Patient was told to return to the office in one week for reevaluation of this painful foot condition.  If this condition worsens or becomes very painful, the patient was told to contact this office or go to the Emergency Department at the hospital.  Helane GuntherGregory Terisha Losasso DPM

## 2017-12-05 NOTE — Addendum Note (Signed)
Addended by: Barrington EllisonWAGONER, AMANDA C on: 12/05/2017 03:10 PM   Modules accepted: Orders

## 2017-12-05 NOTE — Telephone Encounter (Signed)
PLease find out if he is taking any of his meds. He certainly needs his colchicine but it sounds like he needs a prednisone burst. Have him try the colchicine bid for now--- and can send Rx for prednisone 20mg  --- 2 daily for 5 days, then 1 daily for 5 days (#15 x 0)

## 2017-12-05 NOTE — Telephone Encounter (Signed)
Please refer to other phone encounter fully documenting patient issues and concerns.    Attempts made to him at 2:05pm and to son Cheree DittoGraham (ok per [facility]  Was able to finally reach patient around 230pm to discuss his medical concerns.  Thanks.

## 2017-12-06 NOTE — Telephone Encounter (Addendum)
Patient did not come today for INR check.  He set up recheck in INR clinic for Tuesday 12/18 at 11:45am.  Patient shared with me that he is very unhappy with the care that he has been receiving here and was surprised that Dr. Alphonsus SiasLetvak did not call and check on him after hearing of his problems this week.  He feels that he has been "unfairly labeled" since he left Dr. Patsey Bertholdook's care and doesn't think that is right.  When asked about specific examples, he just felt that more should have been done for his recent complaints and wondered why he has not been seen for a physical yet.  I understand patient has been dealing with a lot of pain and discomfort as well as difficulties adjusting after his surgery. In addition, I tried to explain that I cannot speak for what occurs in his office visits but I know that Dr.Letvak did respond immediately with a treatment plan to help patient once made aware of his progressing symptoms this week.     Patient still feels that his overall care has not been what he wants and he has made me aware that he is actively looking for another provider.    I told him that I am sorry that he feels he has had this experience and if there is anything we can do to make things better to please let me know.    Patient thanks me for my time and ability to hear him out.  He will see me on Tuesday for coumadin management care.

## 2017-12-09 NOTE — Telephone Encounter (Signed)
Long conversation with him Despite the fractures, the podiatrist still told him he thinks most of the pain is the gout. He should not take the metolazone but can use the furosemide prn (advised he find scaled for home use and check daily). He never took the prednisone--told him he could still use this if he got a flare of the gout

## 2017-12-10 ENCOUNTER — Ambulatory Visit (INDEPENDENT_AMBULATORY_CARE_PROVIDER_SITE_OTHER): Payer: BLUE CROSS/BLUE SHIELD

## 2017-12-10 DIAGNOSIS — Z5181 Encounter for therapeutic drug level monitoring: Secondary | ICD-10-CM

## 2017-12-10 DIAGNOSIS — I482 Chronic atrial fibrillation, unspecified: Secondary | ICD-10-CM

## 2017-12-10 LAB — POCT INR: INR: 1.4

## 2017-12-10 NOTE — Patient Instructions (Addendum)
INR today 1.4  Patient denies any missed doses but has had a lot of recent changes in diet and medications following recent bariatric surgery.  Take 9mg  today (12/18) and tomorrow 12/19 and then take increased dosing of 1 pill (6mg ) daily EXCEPT for 1.5 pills (9mg ) on Mondays.  Recheck in 1-2 weeks.  *Due to holidays and office closing, will need to recheck on 12/25/17.

## 2017-12-12 ENCOUNTER — Encounter: Payer: Self-pay | Admitting: Podiatry

## 2017-12-12 ENCOUNTER — Ambulatory Visit: Payer: BLUE CROSS/BLUE SHIELD | Admitting: Podiatry

## 2017-12-12 DIAGNOSIS — S92341A Displaced fracture of fourth metatarsal bone, right foot, initial encounter for closed fracture: Secondary | ICD-10-CM

## 2017-12-12 DIAGNOSIS — M10371 Gout due to renal impairment, right ankle and foot: Secondary | ICD-10-CM | POA: Diagnosis not present

## 2017-12-12 NOTE — Progress Notes (Signed)
This patient returns to the office 1 week after the diagnosis of an acute gout attack, right foot.  He presents the office stating he's not having any pain or discomfort in his right foot and ankle today.  He says that 3 days after the shot was given his foot started  healing and the pain and  the swelling resolved. He has recently been wearing his Cam Walker for the last week. This patient has a history of gout, kidney disease and diabetes with numbness in both feet. My examination of his x-rays from his initial exam revealed a possible  Charcot foot formation with possible fractures at the base of the metatarsals.  He presents the office today for continued evaluation and treatment and   an application of the cast for his possible Charcot foot..       General Appearance  Alert, conversant and in no acute stress.  Vascular  Dorsalis pedis and posterior pulses are palpable  bilaterally.  Capillary return is within normal limits  Bilaterally. Temperature is within normal limits  Bilaterally  Neurologic  Senn-Weinstein monofilament wire test absent right foot.. Muscle power  Within normal limits bilaterally.  Nails Thick disfigured discolored nails with subungual debride bilaterally from hallux to fifth toes bilaterally. No evidence of bacterial infection or drainage bilaterally.  Orthopedic  No limitations of motion of motion feet bilaterally.  No crepitus or effusions noted.  Examination of his right foot and ankle reveals no evidence of palpable pain over the lateral malleolus of the right ankle.  There is ecchymosis noted at the site of the injection. Patient has better range of motion of his foot and ankle, right foot.  No pain noted in the sinus tarsi of the right foot.  Patient does have persistent swelling on the dorsum of the right foot with no evidence of increased temperature or inflammation noted.  No palpable pain noted. Charcot joint right foot.    Skin  normotropic skin with no  porokeratosis noted bilaterally.  No signs of infections or ulcers noted.      S/P Gout right foot.    ROV>  xamination of his right foot reveals healing noted at the right ankle with no evidence of any pain or limitation of motion.  Patient does have swelling on the dorsum of the foot, but no increased temperature or inflammation or pain at the Charcot joint, right foot.  Therefore, based on my clinical findings. I chose to allow him to return to his regular footgear instead of  applying an Unna boot for possible Charcot foot. He was told to return to his cam walker as needed. If pain and swelling persists.  Patient still is having difficulty with his different doctors who are caring for his health at this time.  He still needs to discuss his medication with his medical doctor.  He says he has talked with his doctor who has already discontinued one of his water pills, which he believes leads to  Gout.  I told the patient to return to the office if there foot pain persists.   If this condition worsens or becomes very painful, the patient was told to contact this office or go to the Emergency Department at the hospital.  Marco James DPM

## 2017-12-18 ENCOUNTER — Other Ambulatory Visit: Payer: Self-pay | Admitting: Internal Medicine

## 2017-12-18 DIAGNOSIS — Z8639 Personal history of other endocrine, nutritional and metabolic disease: Secondary | ICD-10-CM

## 2017-12-25 ENCOUNTER — Ambulatory Visit: Payer: BLUE CROSS/BLUE SHIELD

## 2017-12-26 ENCOUNTER — Ambulatory Visit: Payer: BLUE CROSS/BLUE SHIELD

## 2017-12-27 ENCOUNTER — Telehealth: Payer: Self-pay

## 2017-12-27 NOTE — Telephone Encounter (Signed)
Patient has not been able to make coumadin clinic appointments this week on 12/25/17 or 12/26/17.  I spoke with him yesterday and called back this am to leave a message to reschedule missed appt from yesterday.  I informed him that it was very important that I check him as soon as possible due to dosage adjustment at last visit.  I really wanted to see him today (12/27/17) if at all possible.  Otherwise, may need to schedule for Tuesday or Thursday of next week.  Patient given my number to call back and discuss.

## 2017-12-30 ENCOUNTER — Telehealth: Payer: Self-pay

## 2017-12-30 ENCOUNTER — Ambulatory Visit (INDEPENDENT_AMBULATORY_CARE_PROVIDER_SITE_OTHER): Payer: BLUE CROSS/BLUE SHIELD

## 2017-12-30 DIAGNOSIS — I482 Chronic atrial fibrillation, unspecified: Secondary | ICD-10-CM

## 2017-12-30 DIAGNOSIS — Z5181 Encounter for therapeutic drug level monitoring: Secondary | ICD-10-CM

## 2017-12-30 LAB — POCT INR: INR: 2.1

## 2017-12-30 NOTE — Telephone Encounter (Signed)
I definitely need to see him if he has made all these med changes Please get him set up for an appt as soon as practical

## 2017-12-30 NOTE — Progress Notes (Signed)
This encounter was created in error - please disregard.

## 2017-12-30 NOTE — Telephone Encounter (Signed)
LM again on patient's cell VM and offered him an 1145am tomorrow or several spots on Thursday 01/02/18.  I reminded him again of safety importance of rechecking him as soon as possible.  Patient is to call me back to schedule appointment.

## 2017-12-30 NOTE — Patient Instructions (Addendum)
  INR today 2.1  Continue taking 6mg  (1 pill) daily EXCEPT for 1.5 pills (9mg ) on Mondays only.   Recheck in 4 weeks.     Patient overall is continuing to improve.  He has had some recent changes in his medications as he has stopped his gout and BS meds due to hypoglycemic readings.  FSBS are consistently running in the 80's but he does report a significant decrease in appetite and meal consumption since bariatric surgery.  His gout symptoms have improved over the past week and he continues to monitor his blood pressures and blood sugars carefully.

## 2017-12-30 NOTE — Telephone Encounter (Signed)
While in for coumadin clinic appointment today, patient mentions that he has been having low blood sugars in the 40's as he has not been eating as much.  Patient has stopped all of his blood sugar medications and is consistently running in the 80's and is feeling better.  Also, his gout seems to have improved over the past week and he reports backing off of his gout medication as well.  He is recording a log of his blood sugars and blood pressures but is concerned over not feeling that he is keeping close enough tabs with his doctor concerning his medication management.    He does not have any near plans for follow up with his PCP and is concerned that no one is "leading his care".    I will forward this information to PCP as an FYI.  Please advise if anything further.

## 2018-01-02 NOTE — Telephone Encounter (Signed)
Noted I may need to discharge him from my practice (a rare thing for me) because I cannot be responsible for his care when he is doing his own thing and won't come in. He has expressed a desire to change doctors---please suggest that he consider this (if he doesn't want to see me)

## 2018-01-02 NOTE — Telephone Encounter (Signed)
Spoke with patient and informed him that Dr. Alphonsus SiasLetvak wants to see him to follow up since he has had these medication changes.  Patient states he is not taking any of his blood sugar medications or gout medications and his sugars are consistently 80 - 95.  He feels great and does not want to make an appointment at this time.  However, he will call us to set something up in the near future if anything changes.    I explained to him that we want him to maintain this "feeling good" state and want to be clear on what medications he is taking so we can help him manage his care appropriately.  Despite my attempts, patient continued to refuse to set up appointment at this time.  Will forward to Dr. Alphonsus SiasLetvak as Lorain ChildesFYI.

## 2018-01-03 ENCOUNTER — Other Ambulatory Visit: Payer: Self-pay | Admitting: Family Medicine

## 2018-01-03 NOTE — Telephone Encounter (Signed)
I do not know how to do the Saturday Clin[facility] appts. Lyla SonCarrie, can you help me?

## 2018-01-03 NOTE — Telephone Encounter (Signed)
I don't really feel comfortable sending him in anything without seeing him. We could set him up for Saturday clinic tomorrow if he would like.

## 2018-01-03 NOTE — Telephone Encounter (Signed)
Marco James, Dr Alphonsus SiasLetvak is out of the office this afternoon. Angelica ChessmanMandy was not aware of that when she sent the message to Dr Alphonsus SiasLetvak. Would you be willing to do a medication for him until his appt on Monday.

## 2018-01-03 NOTE — Telephone Encounter (Signed)
Notified patient, he has made an appointment for this Monday at 1215 with Dr. Alphonsus SiasLetvak.  Patient is in extreme pain today.  Says he saw a chiropractor today who informed him that he has had a rib that has "slipped out of socket and back in".  He is having intense spasms in his back area and is yelling out in discomfort at times on the phone.  Patient asks if there is anything at all dr. Alphonsus SiasLetvak could call him in for the pain or muscle relaxant or something to help him until he sees him on Monday?

## 2018-01-03 NOTE — Telephone Encounter (Signed)
Spoke to pt to offer him an appt with Nicki Reaperegina Baity at [facility] tomorrow. He is in too much pain to wait until tomorrow and to drive that far in to [place]. He will probably go somewhere tonight.

## 2018-01-05 NOTE — Telephone Encounter (Signed)
You probably should have told me what he wanted---I would have approved a muscle relaxant before the weekend

## 2018-01-06 ENCOUNTER — Ambulatory Visit (INDEPENDENT_AMBULATORY_CARE_PROVIDER_SITE_OTHER): Payer: BLUE CROSS/BLUE SHIELD | Admitting: Internal Medicine

## 2018-01-06 ENCOUNTER — Encounter: Payer: Self-pay | Admitting: Internal Medicine

## 2018-01-06 VITALS — BP 138/86 | HR 65 | Temp 98.0°F | Wt 342.0 lb

## 2018-01-06 DIAGNOSIS — E1142 Type 2 diabetes mellitus with diabetic polyneuropathy: Secondary | ICD-10-CM | POA: Diagnosis not present

## 2018-01-06 DIAGNOSIS — N183 Chronic kidney disease, stage 3 unspecified: Secondary | ICD-10-CM

## 2018-01-06 DIAGNOSIS — M1A9XX0 Chronic gout, unspecified, without tophus (tophi): Secondary | ICD-10-CM | POA: Diagnosis not present

## 2018-01-06 MED ORDER — GABAPENTIN 600 MG PO TABS: 300.0000 mg | ORAL_TABLET | Freq: Every day | ORAL | 11 refills | Status: DC

## 2018-01-06 MED ORDER — FUROSEMIDE 80 MG PO TABS: 80.0000 mg | ORAL_TABLET | Freq: Every day | ORAL | 0 refills | Status: DC | PRN

## 2018-01-06 MED ORDER — ALLOPURINOL 300 MG PO TABS: 300.0000 mg | ORAL_TABLET | Freq: Every day | ORAL | 3 refills | Status: DC

## 2018-01-06 NOTE — Progress Notes (Signed)
Subjective:    Patient ID: Marco James [facility], male    DOB: [birth year], 62 y.o.   MRN: [id number]  HPI Here for follow up and review of back/rib pain  Went to chiropractor for back adjustment None in 2-3 years Got neck and back adjusted--- thinks rib was pulled out on left side Was having muscle spasm as well Went back in and put the rib back in place Had called in here in pain---finally did get better with the prednisone  Ran out of allopurinol a few weeks ago Off the diuretic---but did go back on the furosemide daily He doesn't feel he needs this Gout in both feet, right hand and wrist and shoulder Took colchicine 3 tabs two days in a row--then got some loose stools  Stopped the glipizide Sugars were better--- 84--109 Ongoing neuropathy pain--wonders about gabapentin  Current Outpatient Medications on File Prior to Visit  Medication Sig Dispense Refill  . allopurinol (ZYLOPRIM) 100 MG tablet Take 1 tablet (100 mg total) by mouth daily. 90 tablet 3  . colchicine 0.6 MG tablet Take 0.6 mg by mouth daily.     Marland Kitchen doxazosin (CARDURA) 8 MG tablet Take 1 tablet (8 mg total) by mouth daily. (Patient taking differently: Take 8 mg 2 (two) times daily by mouth. ) 1 tablet 0  . furosemide (LASIX) 80 MG tablet Take 1 tablet (80 mg total) by mouth daily. 90 tablet 3  . metoprolol tartrate (LOPRESSOR) 100 MG tablet take 1 tablet by mouth twice a day (Patient taking differently: Take 200 mg by mouth once daily) 180 tablet 3  . potassium chloride SA (K-DUR,KLOR-CON) 20 MEQ tablet take 2 tablets by mouth twice a day 360 tablet 1  . predniSONE (DELTASONE) 20 MG tablet If pain not improved by cortisone injection then take 2 pills daily X 5 days then 1 pill daily X 5 days. 15 tablet 0  . warfarin (COUMADIN) 3 MG tablet Take 3 tablets (9 mg total) by mouth daily at 6 PM. 60 tablet 0  . warfarin (COUMADIN) 6 MG tablet Take 1 tablet (6 mg total) by mouth daily at 6 PM. 60 tablet 0  . allopurinol  (ZYLOPRIM) 300 MG tablet Take 1 tablet (300 mg total) by mouth daily. (Patient not taking: Reported on 01/06/2018) 30 tablet 11  . lisinopril (PRINIVIL,ZESTRIL) 40 MG tablet take 1 tablet by mouth once daily (Patient not taking: Reported on 01/06/2018) 90 tablet 0  . traMADol (ULTRAM) 50 MG tablet Take 1 tablet (50 mg total) by mouth 3 (three) times daily as needed. (Patient not taking: Reported on 01/06/2018) 20 tablet 0   No current facility-administered medications on file prior to visit.     No Known Allergies  Past Medical History:  Diagnosis Date  . A-fib (HCC)   . Arthritis   . Cellulitis and abscess of leg 2009  . CHF (congestive heart failure) (HCC)   . Dupuytren's disease   . Dyspnea    at times  . Dysrhythmia   . External hemorrhoids   . Gout   . HTN (hypertension)   . Hyperlipidemia   . Hyperuricemia   . Obstructive sleep apnea    cpap  . Pneumonia   . Polysubstance abuse (HCC)   . Toenail fungus   . Type 2 diabetes mellitus (HCC)    type 2    Past Surgical History:  Procedure Laterality Date  . COLONOSCOPY WITH PROPOFOL N/A 10/25/2015   Procedure: COLONOSCOPY WITH PROPOFOL;  Surgeon: Ebony Hail  Servando SnareWohl, MD;  Location: [facility] ENDOSCOPY;  Service: Endoscopy;  Laterality: N/A;  . LAPAROSCOPIC GASTRIC SLEEVE RESECTION     11/18/17 Dr. Daphine DeutscherMartin  . LAPAROSCOPIC GASTRIC SLEEVE RESECTION N/A 11/18/2017   Procedure: LAPAROSCOPIC GASTRIC SLEEVE RESECTION WITH UPPER ENDOSCOPY;  Surgeon: Luretha MurphyMartin, Matthew, MD;  Location: [facility];  Service: General;  Laterality: N/A;  . NASAL SINUS SURGERY    . WISDOM TOOTH EXTRACTION      Family History  Problem Relation Age of Onset  . Heart disease Father 5777    Social History   Socioeconomic History  . Marital status: Divorced    Spouse name: Not on file  . Number of children: 1  . Years of education: Not on file  . Highest education level: Not on file  Social Needs  . Financial resource strain: Not on file  . Food insecurity - worry: Not  on file  . Food insecurity - inability: Not on file  . Transportation needs - medical: Not on file  . Transportation needs - non-medical: Not on file  Occupational History  . Occupation: Toolmaker    Comment: self employed  Tobacco Use  . Smoking status: Former Smoker    Packs/day: 0.50    Years: 25.00    Pack years: 12.50    Types: Cigarettes  . Smokeless tobacco: Former NeurosurgeonUser    Quit date: 11/08/1991  Substance and Sexual Activity  . Alcohol use: Yes    Comment: just occasional  . Drug use: Yes    Types: Marijuana    Comment: Pt. used cocaine for 3 years, marijuana--long ago  . Sexual activity: Not Currently  Other Topics Concern  . Not on file  Social History Narrative   1 son in [place]   Review of Systems No longer aching like he had been after the surgery Weight is down 20-30# from peak    Objective:   Physical Exam  Constitutional: No distress.  Musculoskeletal: He exhibits no edema.  Some dactylitis in right 2nd and 3rd fingers. Red, swelling in 2nd MCP also           Assessment & Plan:

## 2018-01-06 NOTE — Assessment & Plan Note (Signed)
Discussed that this is the reason to avoid the NSAIDs

## 2018-01-06 NOTE — Assessment & Plan Note (Signed)
Sugars are better Off the glipizide Will try gabapentin for bedtime

## 2018-01-06 NOTE — Assessment & Plan Note (Signed)
Flared again--had stopped the allopurinol Will limit the furosemide Colchicine daily and prednisone for flares

## 2018-01-07 ENCOUNTER — Telehealth: Payer: Self-pay | Admitting: Internal Medicine

## 2018-01-07 MED ORDER — LISINOPRIL 40 MG PO TABS: 40.0000 mg | ORAL_TABLET | Freq: Every day | ORAL | 0 refills | Status: DC

## 2018-01-07 NOTE — Telephone Encounter (Signed)
Copied from CRM [phone]. Topic: Quick Communication - Rx Refill/Question >> Jan 07, 2018  3:31 PM Marco James, Marco James wrote: Medication: lisinopril (PRINIVIL,ZESTRIL) 40 MG tablet Has the patient contacted their pharmacy? Yes (Agent: If no, request that the patient contact the pharmacy for the refill.) Preferred Pharmacy (with phone number or street name): [facility] [address] - [place], [place] - [id number] Agent: Please be advised that RX refills may take up to 3 business days. We ask that you follow-up with your pharmacy.

## 2018-01-16 ENCOUNTER — Encounter: Payer: BLUE CROSS/BLUE SHIELD | Attending: Surgery | Admitting: Skilled Nursing Facility1

## 2018-01-16 ENCOUNTER — Encounter: Payer: Self-pay | Admitting: Skilled Nursing Facility1

## 2018-01-16 DIAGNOSIS — I10 Essential (primary) hypertension: Secondary | ICD-10-CM | POA: Insufficient documentation

## 2018-01-16 DIAGNOSIS — Z713 Dietary counseling and surveillance: Secondary | ICD-10-CM | POA: Insufficient documentation

## 2018-01-16 DIAGNOSIS — I4891 Unspecified atrial fibrillation: Secondary | ICD-10-CM | POA: Diagnosis not present

## 2018-01-16 DIAGNOSIS — E119 Type 2 diabetes mellitus without complications: Secondary | ICD-10-CM

## 2018-01-16 NOTE — Progress Notes (Signed)
Follow-up visit:  8 Weeks Post-Operative Sleeve Surgery  Primary concerns today: Post-operative Bariatric Surgery Nutrition Management.  Pt arrives with a severe gouty flare up.  Pt states he does not ever want to do the tanita.  Pt states he is no longer taking glipizide after having a few a lows. Pt states he has been struggling with gouty flair ups doctor saying the fluid pills were causing it so he stopped taking them.  Pt spent some time complaining about his doctor. Pt states he is really missing diet pepsi. Pt states he has had some special bread a friend made him. Pt states he feels better than he has in years. Pt states he has stopped weighing himself every day. Pt states he has noticed he has smaller clothes but not seen weight loss and has been getting out of his couch easier. Pt states he wants to start water aerobics. Pt states he has been cooking more for himself. Pt states his blood sugars have been 84 every morning.    Surgery date: 11/18/2017 Surgery type: Sleeve [facility]: 391 Weight today: 339 Weight change: 22   24-hr recall: sausage, egg, cheese B (AM): 2 eggs and 1 Malaw[place]urkey sausage link or 2 sausage biscuits without the bread Snk (AM):  L (PM): half chili from [employer] or grilled chicken breast or wendys double cheese burgers without the bun Snk (PM): other half of chili D (PM): hamburger steak or pan fried salmon Snk (PM): rest of hamburger patty or chicken breast  Fluid intake: water, 1% milk, sugar free natures twist: 64+ Estimated total protein intake: 60+  Medications: see list Supplementation: celebrate and calcium sometimes not getting htem all in  Using straws: no Drinking while eating: no Having you been chewing well: yes  Chewing/swallowing difficulties: no Changes in vision: no Changes to mood/headaches: no Hair loss/Cahnges to skin/Changes to nails: no Any difficulty focusing or concentrating: no Sweating: no Dizziness/Lightheaded:  no Palpitations: no  Carbonated beverages: no N/V/D/C/GAS: no Abdominal Pain: no Dumping syndrome: no  Recent physical activity:  ADL's  Progress Towards Goal(s):  In progress.  Handouts given during visit include:  Non starchy veggies + protein   Nutritional Diagnosis:  [place]-3.3 Overweight/obesity related to past poor dietary habits and physical inactivity as evidenced by patient w/ recent sleeve surgery following dietary guidelines for continued weight loss.    Intervention:  Nutrition counseling. Goals: Aim to have vegetables with every lunch and every dinner every day  Teaching Method Utilized:  Visual Auditory Hands on  Barriers to learning/adherence to lifestyle change: none identified   Demonstrated degree of understanding via:  Teach Back   Monitoring/Evaluation:  Dietary intake, exercise, and body weight.

## 2018-01-27 ENCOUNTER — Telehealth: Payer: Self-pay

## 2018-01-27 NOTE — Telephone Encounter (Signed)
Note routed to Dr. Karle StarchLetvak's CMA.  Thanks.

## 2018-01-27 NOTE — Telephone Encounter (Signed)
He might want to do that based on weight. If he feels the extra fluid is off now--- he should take today's weight (or tomorrow morning first thing) as his "dry" weight. He can then check daily and take the furosemide any time he is up 3-4# from that good weight.

## 2018-01-27 NOTE — Telephone Encounter (Addendum)
  Patient states that he has recently been experiencing significant SOB during morning hours.  As a result, he has restarted taking Furosemide 80mg  daily for past 2 days.  He reports that in 2 days he has lost 9 lbs of fluid and feels great!    He denies any other symptoms and states that his breathing is fine today.  [name] thinks he should take the furosemide with some consistency to prevent fluid retention complications but would like for Marco James to instruct him as to how often that should be.  Please advise and have CMA call patient back today.  Thanks.

## 2018-01-27 NOTE — Telephone Encounter (Signed)
Spoke to pt. He appreciated the call. He will most likely decide to take it when he feels swollen.

## 2018-01-28 ENCOUNTER — Ambulatory Visit: Payer: BLUE CROSS/BLUE SHIELD

## 2018-01-30 ENCOUNTER — Ambulatory Visit (INDEPENDENT_AMBULATORY_CARE_PROVIDER_SITE_OTHER): Payer: BLUE CROSS/BLUE SHIELD | Admitting: General Practice

## 2018-01-30 ENCOUNTER — Encounter: Payer: Self-pay | Admitting: Cardiovascular Disease

## 2018-01-30 DIAGNOSIS — I482 Chronic atrial fibrillation, unspecified: Secondary | ICD-10-CM

## 2018-01-30 DIAGNOSIS — Z5181 Encounter for therapeutic drug level monitoring: Secondary | ICD-10-CM

## 2018-01-30 LAB — POCT INR: INR: 2.6

## 2018-01-30 NOTE — Patient Instructions (Addendum)
Pre visit review using our clinic review tool, if applicable. No additional management support is needed unless otherwise documented below in the visit note.  Continue taking 6mg  (1 pill) daily EXCEPT for 1.5 pills (9mg ) on Mondays only.  Recheck in 6 weeks per patient request.

## 2018-02-10 NOTE — Progress Notes (Signed)
Cardiology Office Note  Date:  02/12/2018   ID:  TREYVEN LAFAUCI, DOB 1956/05/01, MRN [id number]  PCP:  Karie Schwalbe, MD   Chief Complaint  Patient presents with  . Other    Early follow up due to patient having issues with diuretics. Meds reviewed verbally with patient.     HPI:  Mr. Bisig is a 62 year old gentleman with  morbid obesity,   hypertension,  diabetes type 2,  Prior smoking history, stopped 2016 chronic lower extremity swelling, previously on high-dose diuretics for symptomatic relief chronic atrial fibrillation dating back to before 2010 OSA on CPAP who follows up for his atrial fibrillation and lower extremity edema  Reports recovering well from gastric bypass surgery Was off diuretics for long time after the procedure And reported developing Leg swelling, SOB at night Restarted diuretics, taking Lasix 80 mg as needed Also reports having trouble with gout, 4 epsiodes Weight down 70 pounds since before gastric bypass surgery  Seen by rhematology for gout Was told that symptoms exacerbated by metolazone he does not take the pill anymore.   Weight down from 370 pounds now 317 pounds Frustrated that it does not continue to drop  Previously with chronically low potassium  Long discussion concerning his lab work, previous creatinine up to 1.7 with BUN of 60 He does not have latest lab work with him reports having BUN 28 Only taking Lasix twice a week But denies having significant leg swelling  EKG personally reviewed by myself on todays visit Shows atrial fibrillation ventricular rate 59 bpm no significant ST or T wave changes   PMH:   has a past medical history of A-fib (HCC), Arthritis, Cellulitis and abscess of leg (2009), CHF (congestive heart failure) (HCC), Dupuytren's disease, Dyspnea, Dysrhythmia, External hemorrhoids, Gout, HTN (hypertension), Hyperlipidemia, Hyperuricemia, Obstructive sleep apnea, Pneumonia, Polysubstance abuse (HCC), Toenail  fungus, and Type 2 diabetes mellitus (HCC).  PSH:    Past Surgical History:  Procedure Laterality Date  . COLONOSCOPY WITH PROPOFOL N/A 10/25/2015   Procedure: COLONOSCOPY WITH PROPOFOL;  Surgeon: Midge Minium, MD;  Location: [facility] ENDOSCOPY;  Service: Endoscopy;  Laterality: N/A;  . LAPAROSCOPIC GASTRIC SLEEVE RESECTION     11/18/17 Dr. Daphine Deutscher  . LAPAROSCOPIC GASTRIC SLEEVE RESECTION N/A 11/18/2017   Procedure: LAPAROSCOPIC GASTRIC SLEEVE RESECTION WITH UPPER ENDOSCOPY;  Surgeon: Luretha Murphy, MD;  Location: [facility];  Service: General;  Laterality: N/A;  . NASAL SINUS SURGERY    . WISDOM TOOTH EXTRACTION      Current Outpatient Medications  Medication Sig Dispense Refill  . allopurinol (ZYLOPRIM) 100 MG tablet Take 1 tablet (100 mg total) by mouth daily. 90 tablet 3  . allopurinol (ZYLOPRIM) 300 MG tablet Take 1 tablet (300 mg total) by mouth daily. 90 tablet 3  . colchicine 0.6 MG tablet Take 0.6 mg by mouth daily.     Marland Kitchen doxazosin (CARDURA) 8 MG tablet Take 1 tablet (8 mg total) by mouth daily. (Patient taking differently: Take 8 mg 2 (two) times daily by mouth. ) 1 tablet 0  . furosemide (LASIX) 80 MG tablet Take 1 tablet (80 mg total) by mouth daily as needed. 1 tablet 0  . gabapentin (NEURONTIN) 600 MG tablet Take 0.5-1 tablets (300-600 mg total) by mouth at bedtime. 30 tablet 11  . lisinopril (PRINIVIL,ZESTRIL) 40 MG tablet Take 1 tablet (40 mg total) by mouth daily. 90 tablet 0  . metoprolol tartrate (LOPRESSOR) 100 MG tablet take 1 tablet by mouth twice a day (Patient taking  differently: Take 200 mg by mouth once daily) 180 tablet 3  . potassium chloride SA (K-DUR,KLOR-CON) 20 MEQ tablet Take 20 mEq by mouth daily.     . predniSONE (DELTASONE) 20 MG tablet If pain not improved by cortisone injection then take 2 pills daily X 5 days then 1 pill daily X 5 days. 15 tablet 0  . warfarin (COUMADIN) 3 MG tablet Take 3 tablets (9 mg total) by mouth daily at 6 PM. 60 tablet 0  . warfarin  (COUMADIN) 6 MG tablet Take 1 tablet (6 mg total) by mouth daily at 6 PM. 60 tablet 0   No current facility-administered medications for this visit.      Allergies:   Patient has no known allergies.   Social History:  The patient  reports that he has quit smoking. His smoking use included cigarettes. He has a 12.50 pack-year smoking history. He quit smokeless tobacco use about 26 years ago. He reports that he uses drugs. Drug: Marijuana. He reports that he does not drink alcohol.   Family History:   family history includes Heart disease (age of onset: 73) in his father.    Review of Systems: Review of Systems  Constitutional: Positive for weight loss.  Cardiovascular: Negative.   Gastrointestinal: Negative.   Musculoskeletal: Negative.   Neurological: Negative.   Psychiatric/Behavioral: Negative.   All other systems reviewed and are negative.    PHYSICAL EXAM: VS:  BP 110/78 (BP Location: Left Arm, Patient Position: Sitting, Cuff Size: Large)   Pulse (!) 59   Ht 6' (1.829 m)   Wt (!) 324 lb (147 kg)   BMI 43.94 kg/m  , BMI Body mass index is 43.94 kg/m.  Constitutional:  oriented to person, place, and time. No distress.  HENT:  Head: Normocephalic and atraumatic.  Eyes:  no discharge. No scleral icterus.  Neck: Normal range of motion. Neck supple. No JVD present.  Cardiovascular: Normal rate, regular rhythm, normal heart sounds and intact distal pulses. Exam reveals no gallop and no friction rub.  No murmur heard. Pulmonary/Chest: Effort normal and breath sounds normal. No stridor. No respiratory distress.  no wheezes.  no rales.  no tenderness.  Abdominal: Soft.  no distension.  no tenderness.  Musculoskeletal: Normal range of motion.  no  tenderness or deformity.  Neurological:  normal muscle tone. Coordination normal. No atrophy Skin: Skin is warm and dry. No rash noted. not diaphoretic.  Psychiatric:  normal mood and affect. behavior is normal. Thought content normal.      Recent Labs: 11/18/2017: ALT 22; BUN 60; Creatinine, Ser 1.79; Potassium 3.5; Sodium 136 11/19/2017: Hemoglobin 12.8; Platelets 127    Lipid Panel Lab Results  Component Value Date   CHOL 180 10/15/2017   HDL 32.60 (L) 10/15/2017   LDLCALC 110 (H) 10/15/2017   TRIG 186.0 (H) 10/15/2017      Wt Readings from Last 3 Encounters:  02/12/18 (!) 324 lb (147 kg)  01/16/18 (!) 339 lb (153.8 kg)  01/06/18 (!) 342 lb (155.1 kg)       ASSESSMENT AND PLAN:  Diastolic CHF, chronic (HCC) Dramatic improvement in leg edema, breathing after gastric bypass surgery and 50+ pound weight loss.  He is requiring less Lasix, dramatic improvement in leg swelling Recommended he continue to take Lasix 80 mg as needed for weight 320 pounds or higher.  This can be adjusted if he continues to lose weight  Chronic atrial fibrillation (HCC) Rate well controlled, not a candidate to restore  normal sinus rhythm Warfarin managed by primary care. He prefers not to have this checked on a regular basis Would be a good candidate for Eliquis or Xarelto.  We will discuss with him in follow-up   Morbid obesity (HCC) Dramatic improvement after gastric bypass Weight loss, improved renal function as he is using less Lasix, breathing improved Recommend he decrease Cardura down to 1 pill in the morning not 2 pills.  Takes all of his medications in the morning  Essential (primary) hypertension Reports that he takes all of his blood pressure pills in the morning as he does not like to take these twice a day Taking Cardura 2 of the 8 mg pills Denies any orthostasis Blood pressure low today we will decrease Cardura down to 8 mg every morning Would likely need additional changes as he continues to lose weight  Pure hypercholesterolemia Would likely drop with weight loss  Bilateral lower extremity edema Stable chronic leg edema,  Dramatically improved with weight loss  Type 2 diabetes mellitus with diabetic  neuropathy, unspecified long term insulin use status (HCC)  Recommended strict diet, low carbohydrates Weight trending down  CKD (chronic kidney disease) stage 3, GFR 30-59 ml/min We have requested most recent BMP from rheumatology   Total encounter time more than 45 minutes  Greater than 50% was spent in counseling and coordination of care with the patient   Disposition:   F/U  12 months   Orders Placed This Encounter  Procedures  . EKG 12-Lead     Signed, Dossie Arbourim Gollan, M.D., Ph.D. 02/12/2018  [facility], [place] [phone]

## 2018-02-12 ENCOUNTER — Telehealth: Payer: Self-pay | Admitting: Cardiovascular Disease

## 2018-02-12 ENCOUNTER — Ambulatory Visit: Payer: BLUE CROSS/BLUE SHIELD | Admitting: Cardiovascular Disease

## 2018-02-12 ENCOUNTER — Encounter: Payer: Self-pay | Admitting: Cardiovascular Disease

## 2018-02-12 VITALS — BP 110/78 | HR 59 | Ht 72.0 in | Wt 324.0 lb

## 2018-02-12 DIAGNOSIS — I5032 Chronic diastolic (congestive) heart failure: Secondary | ICD-10-CM

## 2018-02-12 DIAGNOSIS — I482 Chronic atrial fibrillation, unspecified: Secondary | ICD-10-CM

## 2018-02-12 DIAGNOSIS — E7800 Pure hypercholesterolemia, unspecified: Secondary | ICD-10-CM | POA: Diagnosis not present

## 2018-02-12 DIAGNOSIS — N183 Chronic kidney disease, stage 3 unspecified: Secondary | ICD-10-CM

## 2018-02-12 DIAGNOSIS — I10 Essential (primary) hypertension: Secondary | ICD-10-CM

## 2018-02-12 DIAGNOSIS — R600 Localized edema: Secondary | ICD-10-CM

## 2018-02-12 DIAGNOSIS — E1140 Type 2 diabetes mellitus with diabetic neuropathy, unspecified: Secondary | ICD-10-CM

## 2018-02-12 MED ORDER — DOXAZOSIN MESYLATE 8 MG PO TABS: 8.0000 mg | ORAL_TABLET | Freq: Every day | ORAL | 6 refills | Status: DC

## 2018-02-12 NOTE — Patient Instructions (Addendum)
Medication Instructions:   Consider taking only one cardura in the morning  Labwork:  No new labs needed  Testing/Procedures:  No further testing at this time   Follow-Up: It was a pleasure seeing you in the office today. Please call us if you have new issues that need to be addressed before your next appt.  [phone]  Your physician wants you to follow-up in: 12 months.  You will receive a reminder letter in the mail two months in advance. If you don't receive a letter, please call our office to schedule the follow-up appointment.  If you need a refill on your cardiac medications before your next appointment, please call your pharmacy.  For educational health videos Log in to : [email or web address] Or : [email or web address], password : [email or web address]

## 2018-02-12 NOTE — Telephone Encounter (Signed)
Pt calling giving in last lab results ° ° °Bun 28 °Creatine 1.5 °EGFR 52.29 °Total Protein 6.3 ° °Please call back if we have any questions. ° °  °

## 2018-02-12 NOTE — Telephone Encounter (Signed)
LM for patient that labs were received - unsure when they were from, who checked them. Patient seen by MD today  Routed to Dr. Mariah MillingGollan to review

## 2018-02-13 NOTE — Telephone Encounter (Signed)
Lab work looks good Creatinine and BUN trending downward as he is using less Lasix GFR greater than 50 is fantastic

## 2018-02-13 NOTE — Telephone Encounter (Signed)
Left message for patient to call back  

## 2018-02-17 ENCOUNTER — Telehealth: Payer: Self-pay | Admitting: Cardiovascular Disease

## 2018-02-17 NOTE — Telephone Encounter (Signed)
New message ° ° ° °Patient returning call for lab results . Please call °

## 2018-02-17 NOTE — Telephone Encounter (Signed)
Spoke with patient and reviewed Dr. Windell HummingbirdGollan's review of lab results. He also wanted to confirm when to take his medication for weight. Reviewed that on his last office visit recommended were to continue Lasix 80 mg as needed for weight 320 pounds or higher. He verbalized understanding of our conversation, agreement with plan, and had no further questions at this time.

## 2018-02-17 NOTE — Telephone Encounter (Signed)
Duplicate entry. See other telephone note. 

## 2018-03-13 ENCOUNTER — Ambulatory Visit (INDEPENDENT_AMBULATORY_CARE_PROVIDER_SITE_OTHER): Payer: BLUE CROSS/BLUE SHIELD | Admitting: General Practice

## 2018-03-13 DIAGNOSIS — I482 Chronic atrial fibrillation, unspecified: Secondary | ICD-10-CM

## 2018-03-13 DIAGNOSIS — Z5181 Encounter for therapeutic drug level monitoring: Secondary | ICD-10-CM

## 2018-03-13 LAB — POCT INR: INR: 2.2

## 2018-03-13 NOTE — Patient Instructions (Addendum)
Pre visit review using our clinic review tool, if applicable. No additional management support is needed unless otherwise documented below in the visit note.  Continue taking 6mg (1 pill) daily EXCEPT for 1.5 pills (9mg) on Mondays only.  Recheck in 6 weeks per patient request.      

## 2018-03-28 ENCOUNTER — Encounter: Payer: Self-pay | Admitting: Cardiovascular Disease

## 2018-04-09 ENCOUNTER — Other Ambulatory Visit: Payer: Self-pay | Admitting: Internal Medicine

## 2018-04-17 ENCOUNTER — Encounter: Payer: BLUE CROSS/BLUE SHIELD | Attending: Surgery | Admitting: Skilled Nursing Facility1

## 2018-04-17 ENCOUNTER — Ambulatory Visit (INDEPENDENT_AMBULATORY_CARE_PROVIDER_SITE_OTHER): Payer: BLUE CROSS/BLUE SHIELD | Admitting: General Practice

## 2018-04-17 ENCOUNTER — Encounter: Payer: Self-pay | Admitting: Skilled Nursing Facility1

## 2018-04-17 DIAGNOSIS — Z6841 Body Mass Index (BMI) 40.0 and over, adult: Secondary | ICD-10-CM | POA: Diagnosis not present

## 2018-04-17 DIAGNOSIS — Z7901 Long term (current) use of anticoagulants: Secondary | ICD-10-CM

## 2018-04-17 DIAGNOSIS — E119 Type 2 diabetes mellitus without complications: Secondary | ICD-10-CM

## 2018-04-17 DIAGNOSIS — Z9884 Bariatric surgery status: Secondary | ICD-10-CM | POA: Diagnosis not present

## 2018-04-17 DIAGNOSIS — Z5181 Encounter for therapeutic drug level monitoring: Secondary | ICD-10-CM

## 2018-04-17 DIAGNOSIS — I482 Chronic atrial fibrillation, unspecified: Secondary | ICD-10-CM

## 2018-04-17 DIAGNOSIS — Z713 Dietary counseling and surveillance: Secondary | ICD-10-CM | POA: Diagnosis present

## 2018-04-17 LAB — POCT INR: INR: 2.3

## 2018-04-17 NOTE — Progress Notes (Signed)
Follow-up visit:  8 Weeks Post-Operative Sleeve Surgery  Primary concerns today: Post-operative Bariatric Surgery Nutrition Management.  Pt states he does not ever want to do the tanita.  Pt states he has been cooking more for himself which is a very big change. Pt arrived with dried blood all over his left hand stating it is fine its dry now. Pt states he feels like shit stating his gout is flaring up really bad. Pt states he is getting infusions to break up his crystals in his body. Pt states he has had a little dose of diarrhea from time to time. Pt states he has taken himself off his diabetes medications. Pt states his blood sugar was down to 40 one night. Pt states he checked his blood sugar this morning with it being 90 with his A1C 5.4. Pt states he thinks his gout medication casues the dirahhea. Pt states he joined the y but cant work out do to the gout. hasnt been drinking milk in 3 weeks due to not wanting to go to he grocery store.Pt states he feels better in life better attitude. Pt states his phycologist Dr. Cyndia SkeetersLurey said he was the most depressed son of a bitch. Pt states he struggles with his relationship with his Mom and dad stating he sees his mom in the home often and talks to hs brother. Pt states he has had hush puppies and sushi with no problems never having any problems with any food.    Surgery date: 11/18/2017 Surgery type: [facility]lee[facility] Weight today: 309 Weight change: 30   24-hr recall: sausage, egg, cheese; eats out mst meals seafood and such with meat and vegetables, pt states he eats a lot of peanut butter B (AM): 2 eggs and 1 Mal[place]key sausage link or 2 sausage biscuits without the bread Snk (AM):  cheese L (PM): half chili from [employer] or grilled chicken breast or wendys double cheese burgers without the bun or fish and vegetables Snk (PM): eggs D (PM): hamburger steak or pan fried salmon Snk (PM): rest of hamburger patty or chicken breast  Fluid  intake: water, 1% milk, sugar free natures twist: 64+ Estimated total protein intake: 60+  Medications: no longer taking diabetes medications  Supplementation: celebrate and calcium sometimes not getting htem all in  Using straws: no Drinking while eating: no Having you been chewing well: yes  Chewing/swallowing difficulties: no Changes in vision: no Changes to mood/headaches: no Hair loss/Cahnges to skin/Changes to nails: no Any difficulty focusing or concentrating: no Sweating: no Dizziness/Lightheaded: no Palpitations: no  Carbonated beverages: no N/V/D/C/GAS: no Abdominal Pain: no Dumping syndrome: no  Recent physical activity:  ADL's  Progress Towards Goal(s):  In progress.  Handouts given during visit include:  Non starchy veggies + protein   Nutritional Diagnosis:  [place]-3.3 Overweight/obesity related to past poor dietary habits and physical inactivity as evidenced by patient w/ recent sleeve surgery following dietary guidelines for continued weight loss.    Intervention:  Nutrition counseling. Goals: Keep up the great work! Try some potatoes and peas  Teaching Method Utilized:  Visual Auditory Hands on  Barriers to learning/adherence to lifestyle change: none identified   Demonstrated degree of understanding via:  Teach Back   Monitoring/Evaluation:  Dietary intake, exercise, and body weight.

## 2018-04-17 NOTE — Patient Instructions (Addendum)
Pre visit review using our clinic review tool, if applicable. No additional management support is needed unless otherwise documented below in the visit note.  Continue taking 6mg  (1 pill) daily EXCEPT for 1.5 pills (9mg ) on Mondays only.  Recheck in 6 weeks per patient request.

## 2018-04-21 ENCOUNTER — Encounter: Payer: BLUE CROSS/BLUE SHIELD | Admitting: Internal Medicine

## 2018-04-21 ENCOUNTER — Telehealth: Payer: Self-pay

## 2018-04-21 DIAGNOSIS — Z0289 Encounter for other administrative examinations: Secondary | ICD-10-CM

## 2018-04-21 NOTE — Telephone Encounter (Signed)
Copied from CRM [id number]. Topic: Appointment Scheduling - Scheduling Inquiry for Clinic >> Apr 21, 2018  9:42 AM Maia Petties wrote: Reason for CRM: Pt has appt today with Dr. Haydee Salter at [facility] about his gout. He is going to try to make it for his physical but he doesn't know how long the appt will take. Pt thought his physical was last week when he came in for INR check.

## 2018-04-21 NOTE — Telephone Encounter (Signed)
His appt is 2:15 Will see what happens

## 2018-04-21 NOTE — Telephone Encounter (Signed)
Pt has CPX scheduled 04/21/18 at 2:15. FYI to Dr Alphonsus Sias and Carollee Herter CMA.

## 2018-05-29 ENCOUNTER — Ambulatory Visit (INDEPENDENT_AMBULATORY_CARE_PROVIDER_SITE_OTHER): Payer: BLUE CROSS/BLUE SHIELD | Admitting: General Practice

## 2018-05-29 DIAGNOSIS — I482 Chronic atrial fibrillation, unspecified: Secondary | ICD-10-CM

## 2018-05-29 DIAGNOSIS — Z5181 Encounter for therapeutic drug level monitoring: Secondary | ICD-10-CM

## 2018-05-29 LAB — POCT INR: INR: 2.1 (ref 2.0–3.0)

## 2018-05-29 NOTE — Patient Instructions (Addendum)
Pre visit review using our clinic review tool, if applicable. No additional management support is needed unless otherwise documented below in the visit note.  Continue taking 6mg  (1 pill) daily EXCEPT for 1.5 pills (9mg ) on Mondays only.  Recheck in 6 weeks per patient request.

## 2018-06-03 ENCOUNTER — Encounter: Payer: BLUE CROSS/BLUE SHIELD | Attending: Surgery | Admitting: Skilled Nursing Facility1

## 2018-06-03 DIAGNOSIS — Z713 Dietary counseling and surveillance: Secondary | ICD-10-CM | POA: Diagnosis present

## 2018-06-03 DIAGNOSIS — E119 Type 2 diabetes mellitus without complications: Secondary | ICD-10-CM

## 2018-06-03 DIAGNOSIS — Z6841 Body Mass Index (BMI) 40.0 and over, adult: Secondary | ICD-10-CM | POA: Insufficient documentation

## 2018-06-03 DIAGNOSIS — Z9884 Bariatric surgery status: Secondary | ICD-10-CM | POA: Insufficient documentation

## 2018-06-04 ENCOUNTER — Encounter: Payer: Self-pay | Admitting: Skilled Nursing Facility1

## 2018-06-04 NOTE — Progress Notes (Addendum)
Follow-up visit:  8 Weeks Post-Operative Sleeve Surgery  Primary concerns today: Post-operative Bariatric Surgery Nutrition Management.  Pt states he does not ever want to do the tanita.  Pt states he has been cooking more for himself which is a very big change. Pt arrived with dried blood all over his left hand stating it is fine its dry now. Pt states he feels like shit stating his gout is flaring up really bad. Pt states he is getting infusions to break up his crystals in his body. Pt states he has had a little dose of diarrhea from time to time. Pt states he has taken himself off his diabetes medications. Pt states his blood sugar was down to 40 one night. Pt states he checked his blood sugar this morning with it being 90 with his A1C 5.4. Pt states he thinks his gout medication casues the dirahhea. Pt states he joined the y but cant work out do to the gout. hasnt been drinking milk in 3 weeks due to not wanting to go to he grocery store.Pt states he feels better in life better attitude. Pt states his phycologist Dr. Cyndia SkeetersLurey said he was the most depressed son of a bitch. Pt states he struggles with his relationship with his Mom and dad stating he sees his mom in the home often and talks to hs brother. Pt states he has had hush puppies and sushi with no problems never having any problems with any food.    Surgery date: 11/18/2017 Surgery type[facility] Weight today:pt declined  Information Reviewed/ Discussed During Appointment: -Review of composition scale numbers -Fluid requirements (64-100 ounces) -Protein requirements (60-80g) -Strategies for tolerating diet -Advancement of diet to include Starchy vegetables -Barriers to inclusion of new foods -Inclusion of appropriate multivitamin and calcium supplements  -Exercise recommendations    24-hr recall: sausage, egg, cheese; eats out mst meals seafood and such with meat and vegetables, pt states he eats a lot of  peanut butter B (AM): 2 eggs and 1 Malaw[place]urkey sausage link or 2 sausage biscuits without the bread Snk (AM):  cheese L (PM): half chili from [employer] or grilled chicken breast or wendys double cheese burgers without the bun or fish and vegetables Snk (PM): eggs D (PM): hamburger steak or pan fried salmon Snk (PM): rest of hamburger patty or chicken breast  Fluid intake: water, 1% milk, sugar free natures twist: 64+ Estimated total protein intake: 60+  Medications: no longer taking diabetes medications  Supplementation: celebrate and calcium sometimes not getting htem all in  Using straws: no Drinking while eating: no Having you been chewing well: yes  Chewing/swallowing difficulties: no Changes in vision: no Changes to mood/headaches: no Hair loss/Cahnges to skin/Changes to nails: no Any difficulty focusing or concentrating: no Sweating: no Dizziness/Lightheaded: no Palpitations: no  Carbonated beverages: no N/V/D/C/GAS: no Abdominal Pain: no Dumping syndrome: no  Recent physical activity:  ADL's  Progress Towards Goal(s):  In progress.  Handouts given during visit include:  Non starchy veggies + protein   Nutritional Diagnosis:  [place]-3.3 Overweight/obesity related to past poor dietary habits and physical inactivity as evidenced by patient w/ recent sleeve surgery following dietary guidelines for continued weight loss.    Intervention:  Nutrition counseling. Goals: I will conduct 2 stress reducing activities: motorcycle riding and music  I will water aerobics for 1 hour 2 times a week by 07/24/2018  Teaching Method Utilized:  Visual Auditory Hands on  Barriers to learning/adherence to lifestyle  change: none identified   Demonstrated degree of understanding via:  Teach Back   Monitoring/Evaluation:  Dietary intake, exercise, and body weight.

## 2018-06-25 ENCOUNTER — Telehealth: Payer: Self-pay | Admitting: *Deleted

## 2018-06-25 NOTE — Telephone Encounter (Signed)
Left message on VM per DPR. He will need to be seen before anything can be called in.

## 2018-06-25 NOTE — Telephone Encounter (Signed)
Copied from CRM [id number]. Topic: General - Other >> Jun 25, 2018 11:51 AM Gaynelle AduPoole, Shalonda wrote: Reason for CRM: Patient state he has a cold and is needing something to be called in.   [facility] the [id number] - [place] - [address] AT [facility] [address] [phone] ([phone] (Fax)  Please advise

## 2018-07-02 IMAGING — RF DG UGI W/ HIGH DENSITY W/KUB
12 series · 12 of 12 positions shown · non-contrast
Comparison: None in PACs

CLINICAL DATA: Pre bariatric evaluation.  No current GI issues.

EXAM:
UPPER GI SERIES WITH KUB
TECHNIQUE: After obtaining a scout radiograph a routine upper GI series was
performed using thin and high density barium. Effervescent crystals
and a barium tablet were administered.
FLUOROSCOPY TIME:  Fluoroscopy Time:  1 minutes, 24 seconds
Radiation Exposure Index (if provided by the fluoroscopic device):
7637 micro Gy per meters squared
Number of Acquired Spot Images: 10

[Series 1: t abdomen supine · 0.15mm/px · 1 of 1 slices shown (1 of 2)]
[im 1/1]
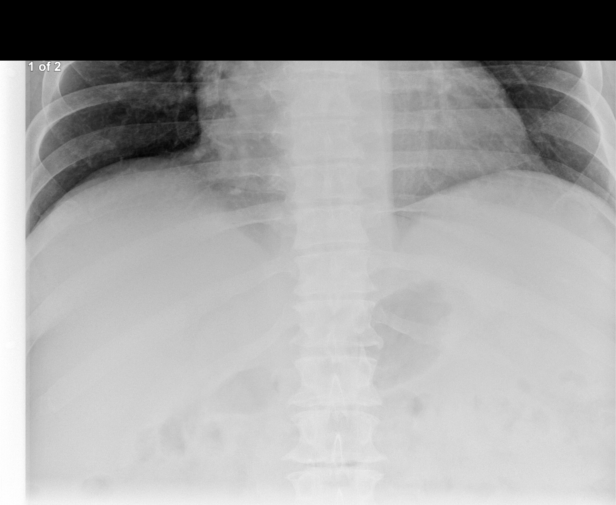

[Series 2: t abdomen supine · 0.15mm/px · 1 of 1 slices shown (2 of 2)]
[im 1/1]
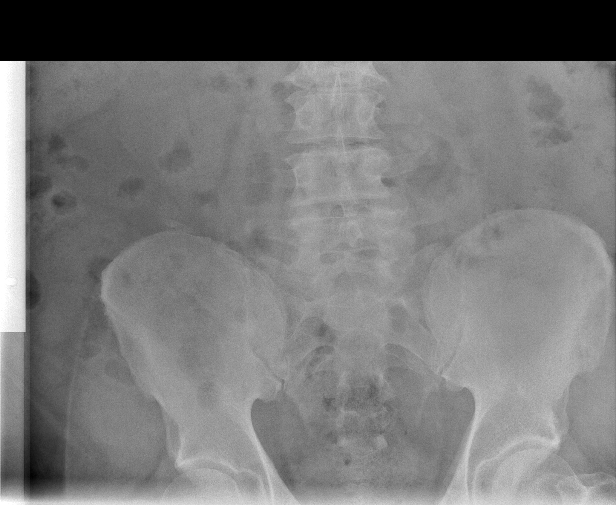

[Series 3: fluoro_barium 2fps_bw · 0.17mm/px · 1 of 1 slices shown (1 of 10)]
[im 1/1]
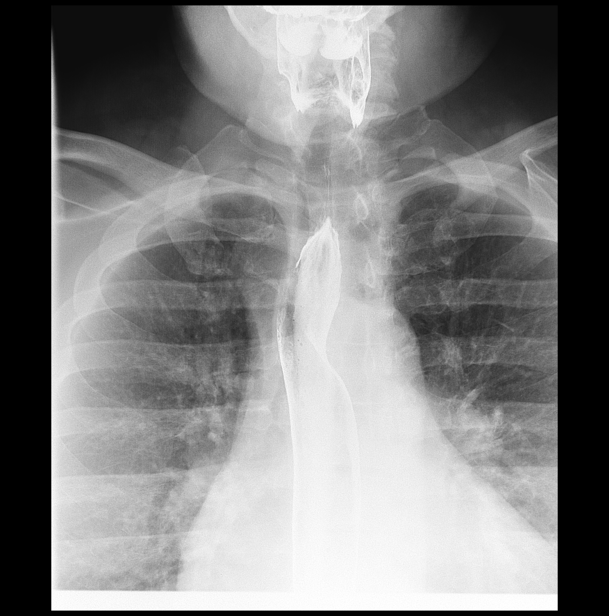

[Series 4: fluoro_barium 2fps_bw · 0.17mm/px · 1 of 1 slices shown (2 of 10)]
[im 1/1]
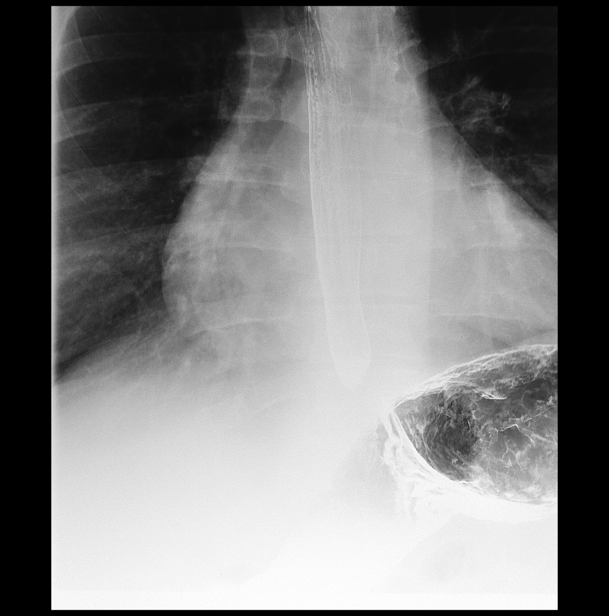

[Series 5: fluoro_barium 2fps_bw · 0.17mm/px · 1 of 1 slices shown (3 of 10)]
[im 1/1]
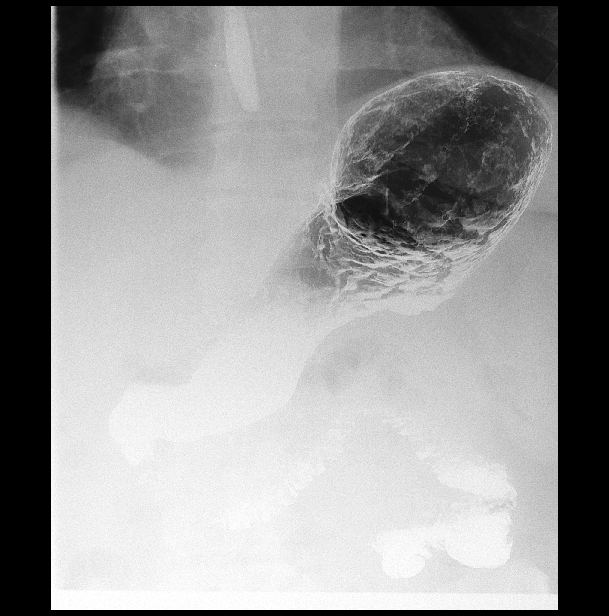

[Series 6: fluoro_barium 2fps_bw · 0.17mm/px · 1 of 1 slices shown (4 of 10)]
[im 1/1]
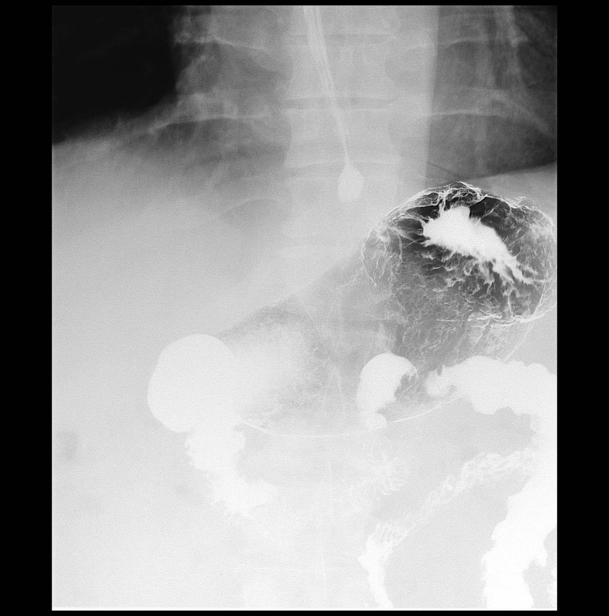

[Series 7: fluoro_barium 2fps_bw · 0.17mm/px · 1 of 1 slices shown (5 of 10)]
[im 1/1]
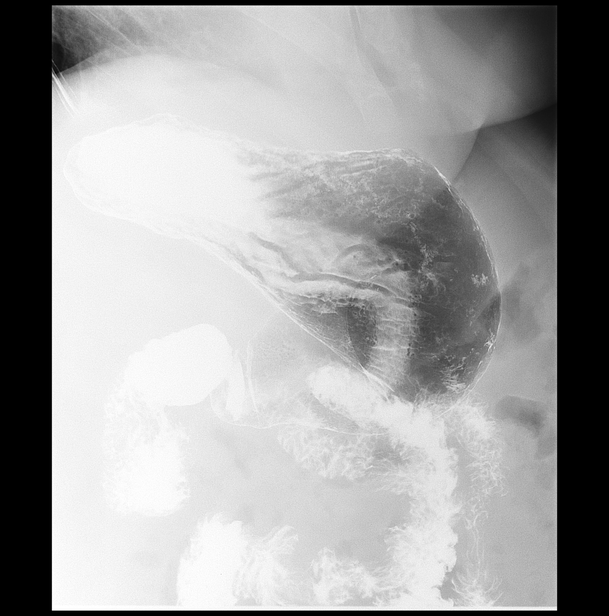

[Series 8: fluoro_barium 2fps_bw · 0.18mm/px · 1 of 1 slices shown (6 of 10)]
[im 1/1]
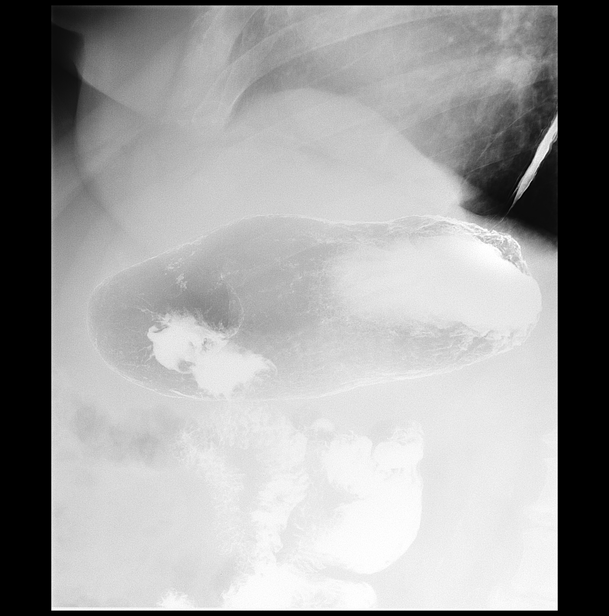

[Series 9: fluoro_barium 2fps_bw · 0.17mm/px · 1 of 1 slices shown (7 of 10)]
[im 1/1]
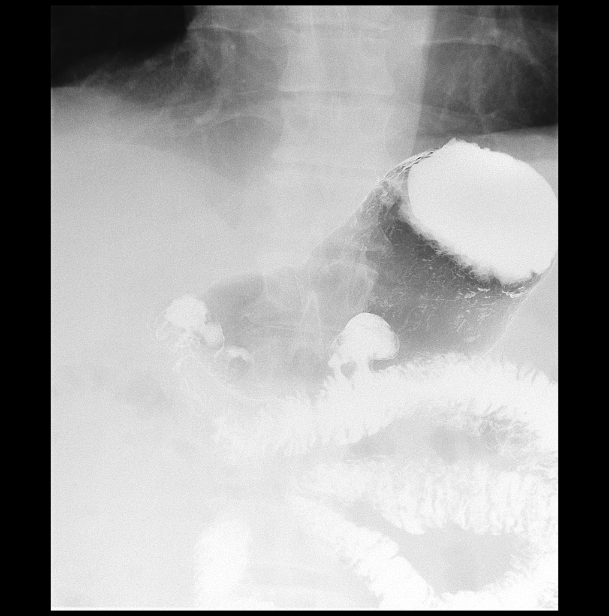

[Series 10: fluoro_barium 2fps_bw · 0.17mm/px · 1 of 1 slices shown (8 of 10)]
[im 1/1]
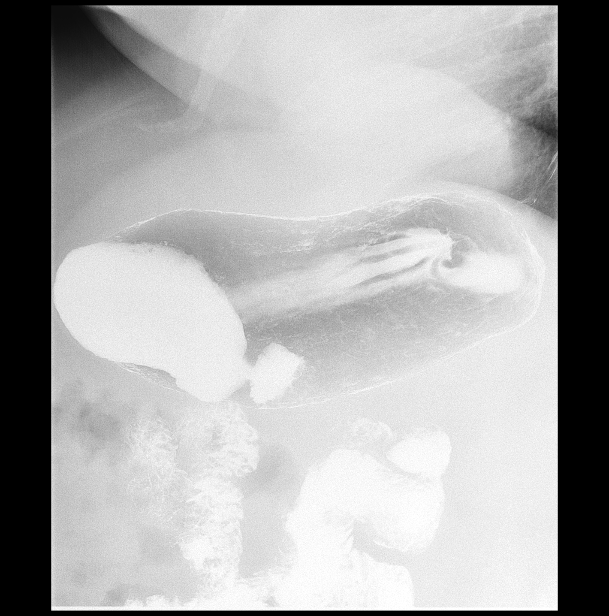

[Series 11: fluoro_barium 2fps_bw · 0.17mm/px · 1 of 1 slices shown (9 of 10)]
[im 1/1]
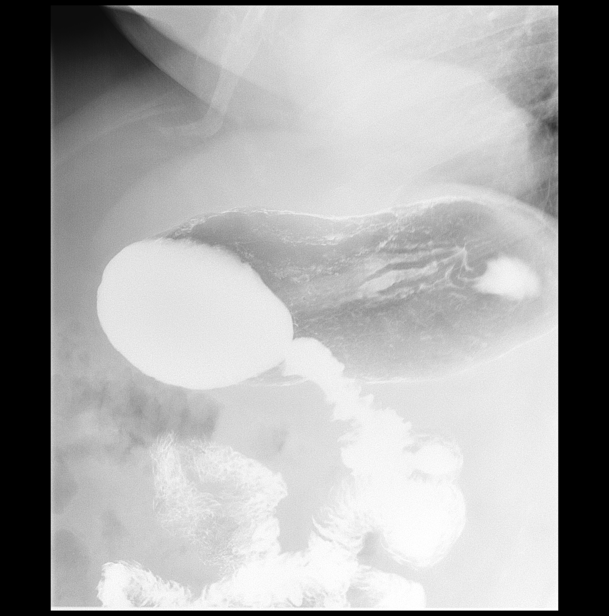

[Series 12: fluoro_barium 2fps_bw · 0.17mm/px · 1 of 1 slices shown (10 of 10)]
[im 1/1]
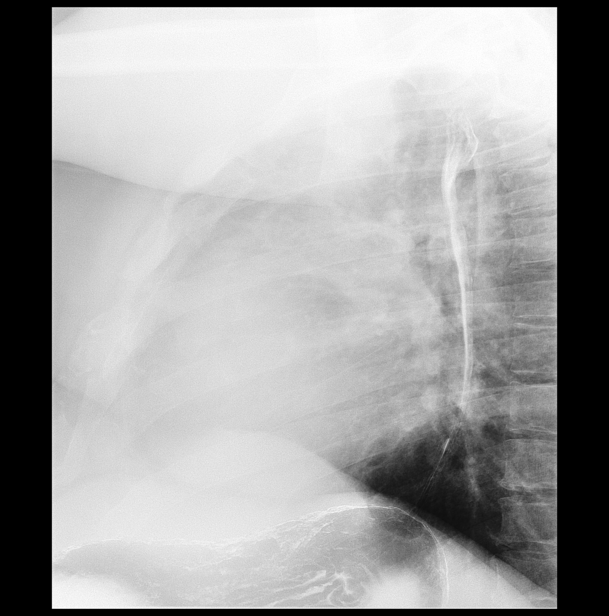

[12 of 12 positions shown; findings below may reference images not displayed]

FINDINGS: The scout radiograph reveals a normal bowel gas pattern. No abnormal
soft tissue calcifications are observed. There is mild degenerative
disc space narrowing in the lumbar spine. The lung bases are clear.

The patient ingested thick and thin barium and the gas-forming
crystals without difficulty. The thoracic esophagus was normal in a
survey fashion. There was a tiny reducible hiatal hernia. No reflux
was observed. The barium tablet passed promptly from the mouth to
the stomach.

The stomach distended well. The mucosal fold pattern was normal.
There was no ulcer niche. Gastric emptying was prompt. The duodenal
bulb and Celsius sweep were normal. A moderate-sized fourth portion
duodenal diverticulum was observed.
IMPRESSION: Normal appearance and position of the stomach and duodenum. There is
a moderate-sized fourth portion duodenal diverticulum.

Small reducible hiatal hernia without visible reflux.

## 2018-07-02 IMAGING — CR DG CHEST 2V
1 series · 2 of 2 positions shown · non-contrast
Comparison: 08/29/2016

CLINICAL DATA: 60-year-old male with a history of bariatric surgery

EXAM:
CHEST  2 VIEW

[Series 1: dg chest 2 view · 0.14mm/px · 2 of 2 slices shown]
[im 1/2]
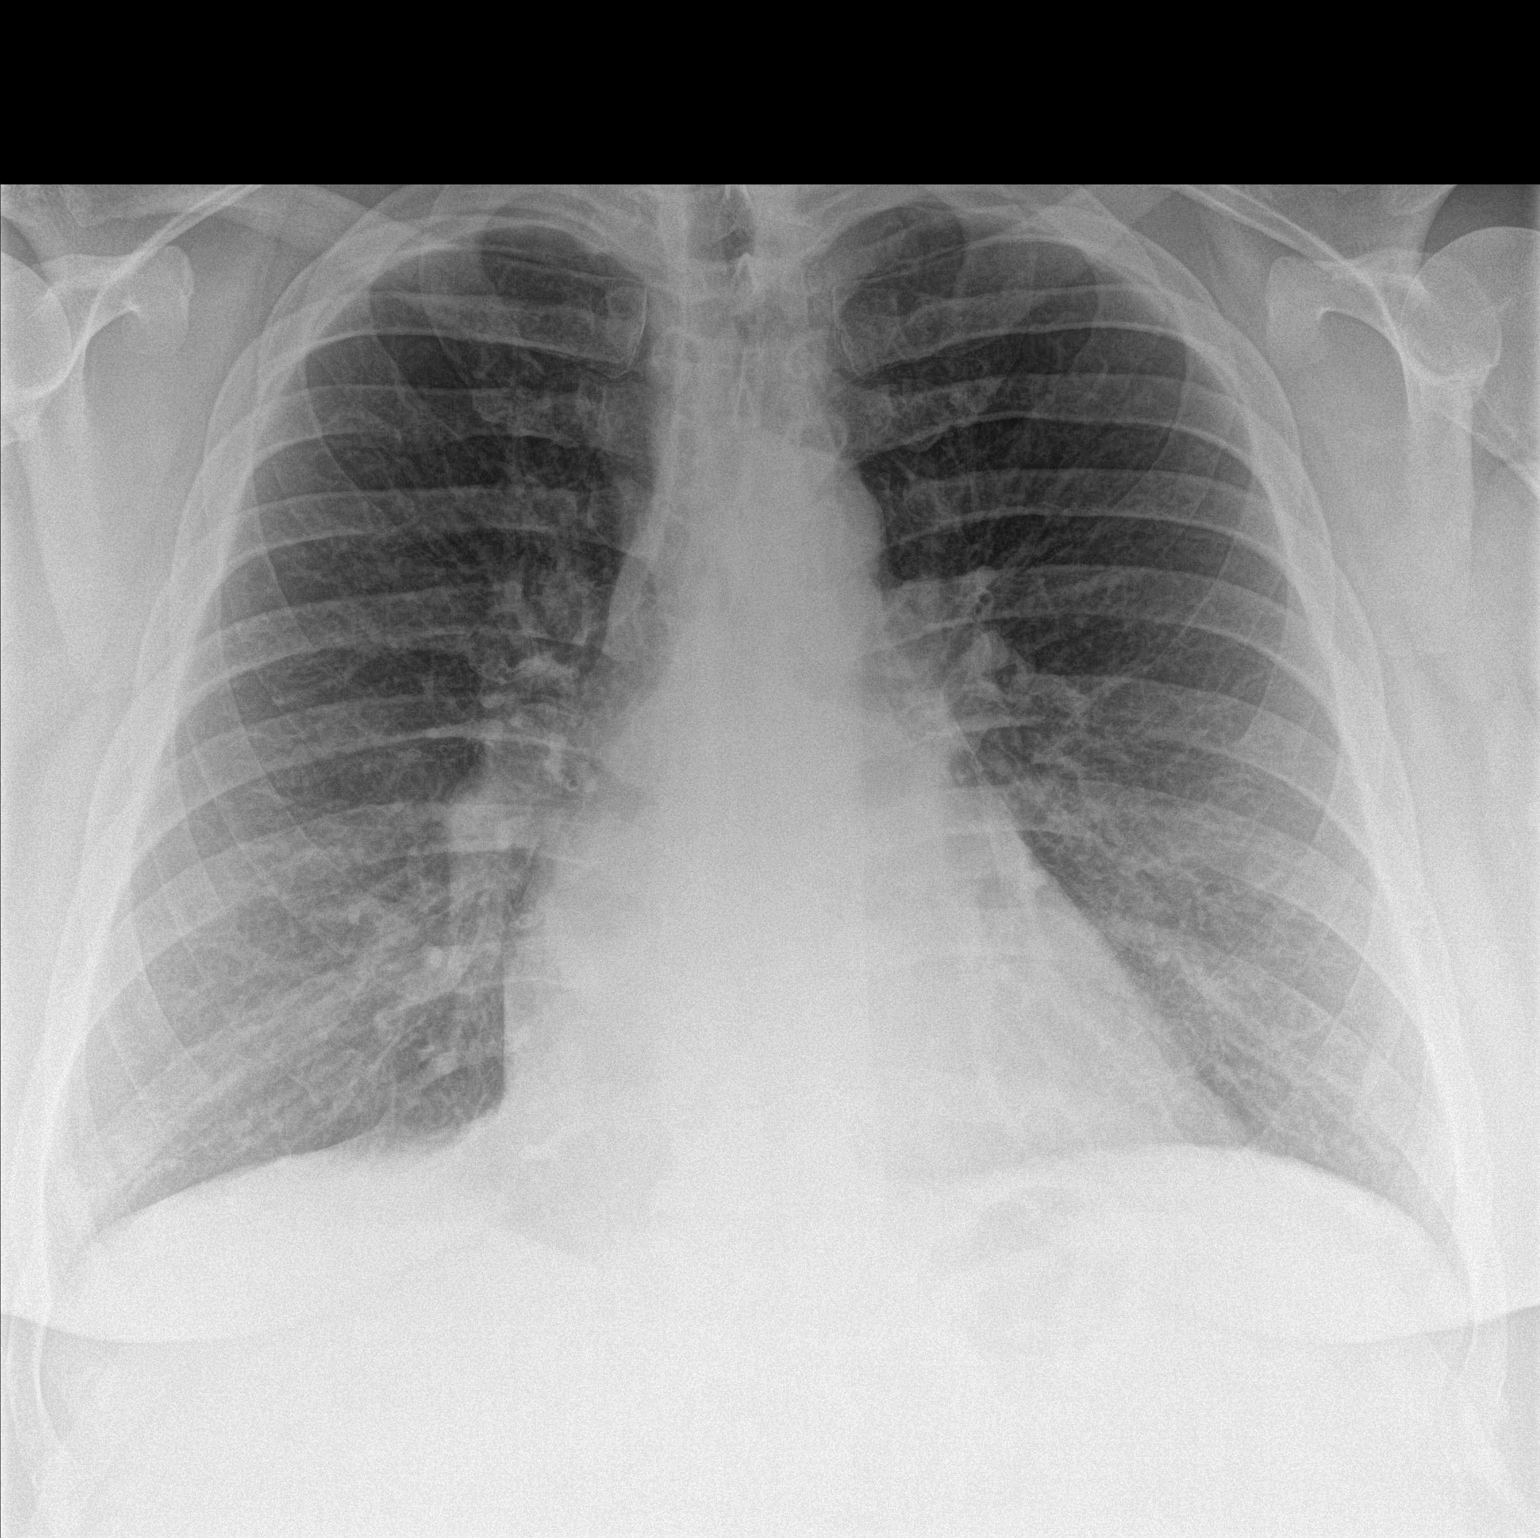
[im 2/2]
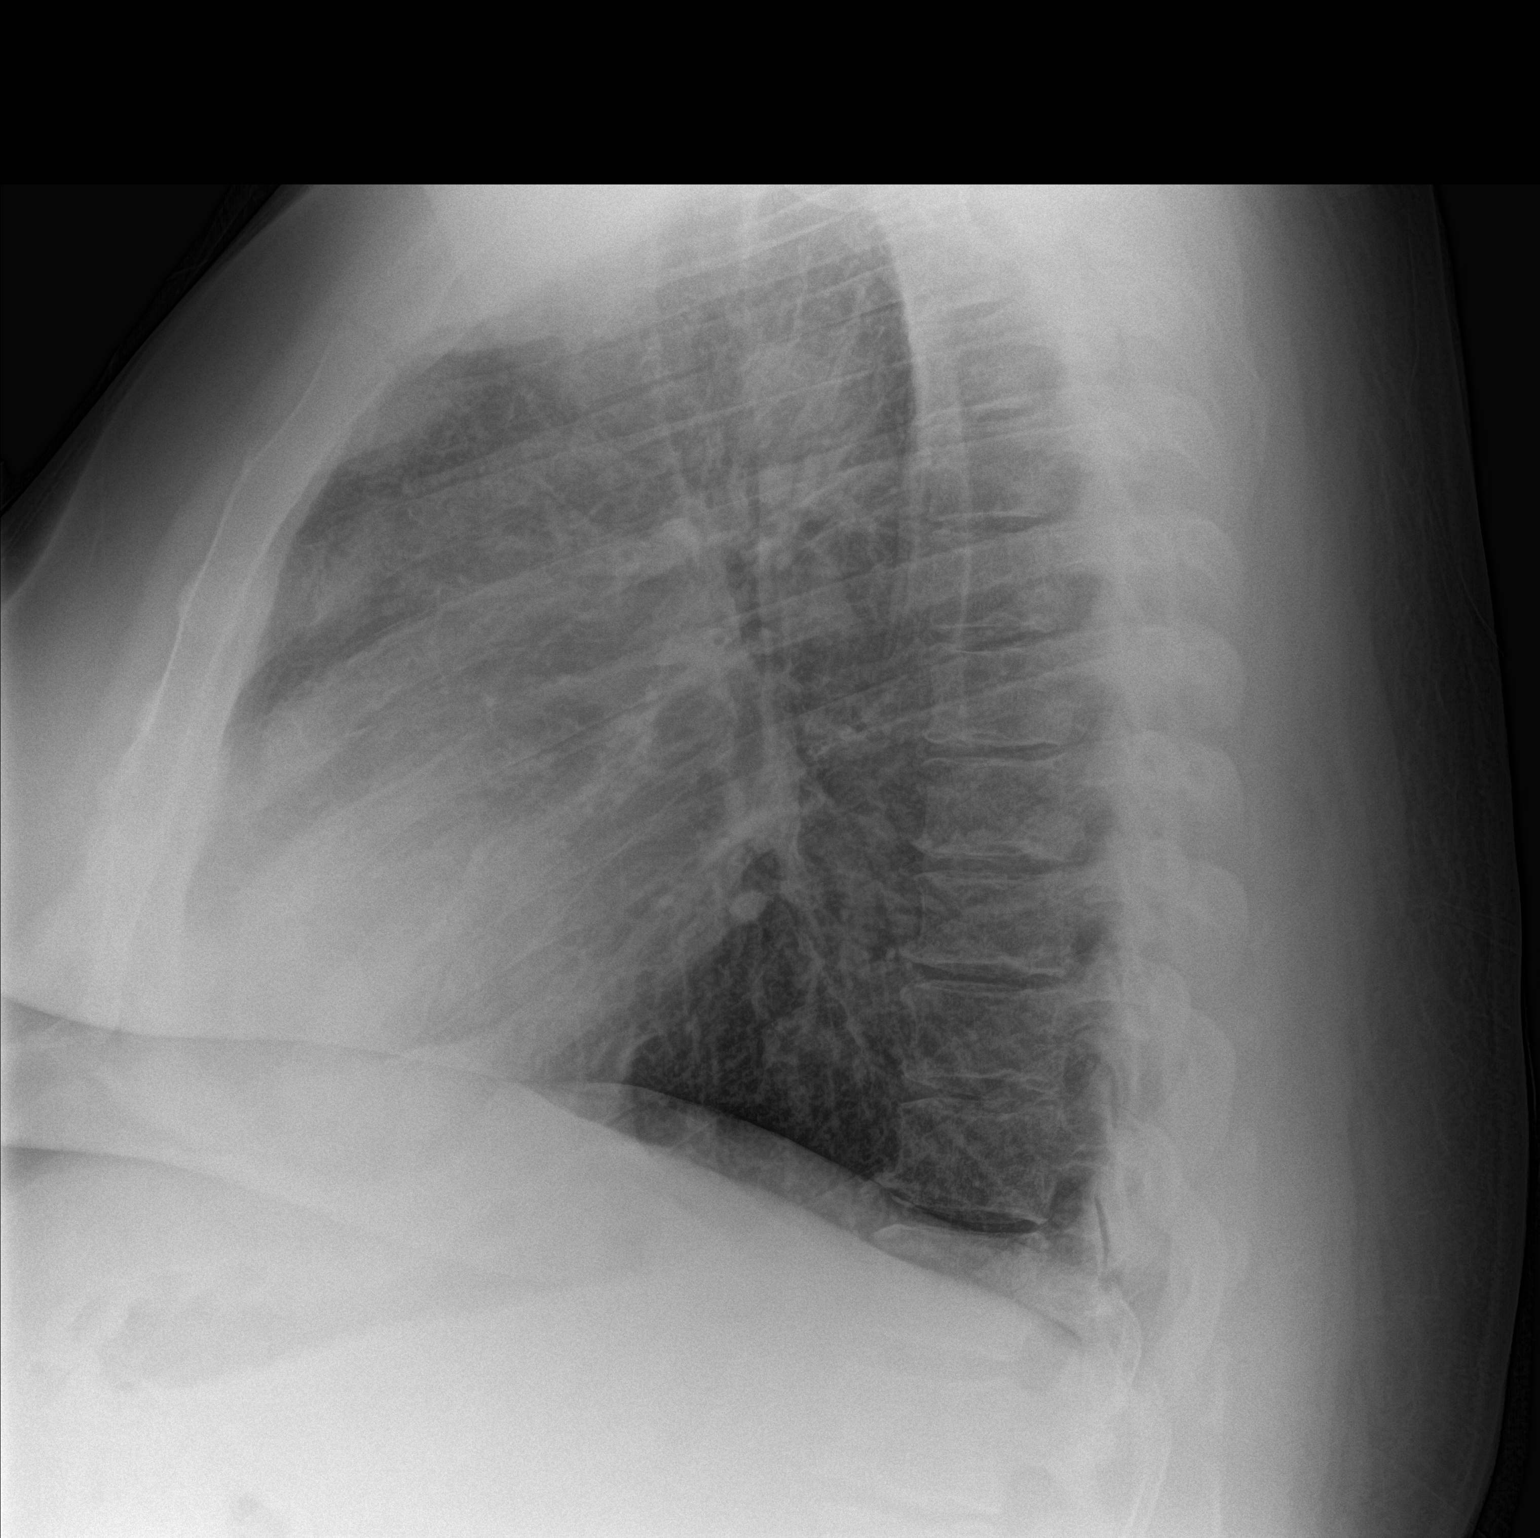

[2 of 2 positions shown; findings below may reference images not displayed]

FINDINGS: Cardiomediastinal silhouette unchanged. No evidence of central
vascular congestion.

No pneumothorax or pleural effusion.  No confluent airspace disease.

No displaced fracture
IMPRESSION: No radiographic evidence of acute cardiopulmonary disease.

## 2018-07-10 ENCOUNTER — Ambulatory Visit (INDEPENDENT_AMBULATORY_CARE_PROVIDER_SITE_OTHER): Payer: BLUE CROSS/BLUE SHIELD | Admitting: General Practice

## 2018-07-10 DIAGNOSIS — I482 Chronic atrial fibrillation, unspecified: Secondary | ICD-10-CM

## 2018-07-10 DIAGNOSIS — Z5181 Encounter for therapeutic drug level monitoring: Secondary | ICD-10-CM

## 2018-07-10 LAB — POCT INR: INR: 1.2 — AB (ref 2.0–3.0)

## 2018-07-10 NOTE — Patient Instructions (Addendum)
Pre visit review using our clinic review tool, if applicable. No additional management support is needed unless otherwise documented below in the visit note.  Take 9 mg today and tomorrow and then continue taking 6mg  (1 pill) daily EXCEPT for 1.5 pills (9mg ) on Mondays only.  Recheck in 5 weeks per patient request.

## 2018-07-15 ENCOUNTER — Encounter: Payer: BLUE CROSS/BLUE SHIELD | Attending: Surgery | Admitting: Skilled Nursing Facility1

## 2018-07-15 ENCOUNTER — Encounter: Payer: Self-pay | Admitting: Skilled Nursing Facility1

## 2018-07-15 DIAGNOSIS — N183 Chronic kidney disease, stage 3 unspecified: Secondary | ICD-10-CM

## 2018-07-15 DIAGNOSIS — Z9884 Bariatric surgery status: Secondary | ICD-10-CM | POA: Diagnosis not present

## 2018-07-15 DIAGNOSIS — Z6841 Body Mass Index (BMI) 40.0 and over, adult: Secondary | ICD-10-CM | POA: Diagnosis not present

## 2018-07-15 DIAGNOSIS — Z713 Dietary counseling and surveillance: Secondary | ICD-10-CM | POA: Insufficient documentation

## 2018-07-15 NOTE — Progress Notes (Signed)
Follow-up visit:  8 Weeks Post-Operative Sleeve Surgery  Primary concerns today: Post-operative Bariatric Surgery Nutrition Management.  Pt states he does not ever want to do the tanita.    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 Pt states he has been eating so much Peanut butter his stools are coming out the consisancy and smell of peanut butter. Pt states he is trying to make this a life long change . Pt states he wants to get rid of his ecxess skin.   Surgery date: 11/18/2017 Surgery type[facility] Weight today:pt declined  Information Reviewed/ Discussed During Appointment: -Review of composition scale numbers -Fluid requirements (64-100 ounces) -Protein requirements (60-80g) -Strategies for tolerating diet -Advancement of diet to include Starchy vegetables -Barriers to inclusion of new foods -Inclusion of appropriate multivitamin and calcium supplements  -Exercise recommendations    24-hr recall: sausage, egg, cheese; eats out mst meals seafood and such with meat and vegetables, pt states he eats a lot of peanut butter B (AM): 2 eggs and 1 Malaw[place]urkey sausage link or 2 sausage biscuits without the bread and tomatoes  Snk (AM):  cheese L (PM): half chili from [employer] or grilled chicken breast or wendys double cheese burgers without the bun or fish and vegetables with hush puppies Snk (PM): eggs D (PM): hamburger steak or pan fried salmon or Malaw[place]urkey burger and canned butter beans Snk (PM): rest of hamburger patty or chicken breast  Fluid intake: water, 1% milk, sugar free natures twist: 64+ Estimated total protein intake: 60+  Medications: no longer taking diabetes medications  Supplementation: celebrate and calcium sometimes not getting htem all in  Using straws: no Drinking while eating: no Having you been chewing well: yes   Chewing/swallowing difficulties: no Changes in vision: no Changes to mood/headaches: no Hair loss/Cahnges to skin/Changes to nails: no Any difficulty focusing or concentrating: no Sweating: no Dizziness/Lightheaded: no Palpitations: no  Carbonated beverages: no N/V/D/C/GAS: no Abdominal Pain: no Dumping syndrome: no  Recent physical activity:  ADL's  Progress Towards Goal(s):  In progress.  Handouts given during visit include:  Non starchy veggies + protein   Nutritional Diagnosis:  [place]-3.3 Overweight/obesity related to past poor dietary habits and physical inactivity as evidenced by patient w/ recent sleeve surgery following dietary guidelines for continued weight loss.    Intervention:  Nutrition counseling. Goals: I will conduct 2 stress reducing activities: motorcycle riding and music  I will water aerobics for 1 hour 2 times a week by 07/24/2018  Teaching Method Utilized:  Visual Auditory Hands on  Barriers to learning/adherence to lifestyle change: none identified   Demonstrated degree of understanding via:  Teach Back   Monitoring/Evaluation:  Dietary intake, exercise, and body weight.

## 2018-07-24 ENCOUNTER — Telehealth: Payer: Self-pay | Admitting: Cardiovascular Disease

## 2018-07-24 NOTE — Telephone Encounter (Signed)
Routing to Dr Mariah MillingGollan to advise as patient feels furosemide is causing gout flare ups and he's gaining weight not taking it.

## 2018-07-24 NOTE — Telephone Encounter (Signed)
Pt c/o medication issue:  1. Name of Medication: furosemide 80 mgpo q d PRN  2. How are you currently taking this medication (dosage and times per day)?   3. Are you having a reaction (difficulty breathing--STAT)? no  4. What is your medication issue?    Dr. Kathi LudwigSyed has been treating patient for gout flares with iv infusion .  Patient discussed experimenting with not taking furosemide for the last week to see if this was causing gout flares.  Patient has not taken for the last week and has not had any flares of gout but has gained 15 lbs denies sob but is swelling in BLE .  Please call to discuss what should be done from here. Patient prefers to speak with MD.

## 2018-07-24 NOTE — Telephone Encounter (Signed)
Patient returning our call

## 2018-07-24 NOTE — Telephone Encounter (Signed)
Left voicemail message to call back  

## 2018-07-25 ENCOUNTER — Telehealth: Payer: Self-pay | Admitting: Internal Medicine

## 2018-07-25 NOTE — Telephone Encounter (Signed)
I called and tried speaking to the pt. He says he has been trying to get this CPE for a year and a half. He forgets to mention the No Show appt he had in April. He says he called last month to be seen for an upper respiratory infection and was not able to be seen. He says he is no longer diabetic based on his numbers he gets at home since his gastric bypass. He does not not think he needs to be seen if we cannot accommodate him.   Dr Alphonsus SiasLetvak, does pt need this CPE?

## 2018-07-25 NOTE — Telephone Encounter (Signed)
Dr Alphonsus SiasLetvak, do you want us to try and get him in sooner by using same days?

## 2018-07-25 NOTE — Telephone Encounter (Signed)
I am working 9/11---why is appt being rescheduled Find a place for him using same days if needed if he is really being inconvenienced

## 2018-07-25 NOTE — Telephone Encounter (Signed)
Best number [phone] Spoke with pt to reschedule his 9/11 appointment due to dr Alphonsus Siasletvak being out of office.  Pt was VERY upset stating he has being trying for a year and half to get a cpx schedule with dr Alphonsus Siasletvak.  He said every time he has an appointment schedule he has to r/s appointment. He no showed for his 04/21/18 cpx appointment   He would like Dr Alphonsus SiasLetvak medical assistant or dr Alphonsus Siasletvak to call him to reschedule this appointment.  He refused to reschedule appointment with me

## 2018-07-25 NOTE — Telephone Encounter (Signed)
Correction pt had appointment on 9/20 not 9/11.  [name] can you call pt.  Pt only wanted you or dr Alphonsus Siasletvak to call to r/s appointment

## 2018-07-26 NOTE — Telephone Encounter (Signed)
He does have diabetes but it is currently controlled without medications I am not going to force him to do anything---I do recommend at least a follow up. It may be easier to set up a 15 minute visit If he wants a PE, we can try to get that scheduled as much at his convenience as possible.

## 2018-07-28 NOTE — Telephone Encounter (Signed)
Spoke to pt. Made an appt for DM F/U on 09-04-18. Advised him he can discuss his thoughts about hid diabetes when he sees Dr Alphonsus SiasLetvak that day.

## 2018-07-28 NOTE — Telephone Encounter (Signed)
Called patient. Gave him Dr Windell HummingbirdGollan's recommendations. He was frustrated and said, "Well, that's why I called to see what I needed to do." As he was saying that the phone call began to break up and unable to hear the patient clearly. After a few moments of trying to hear him the phone call ended. I attempted call patient's number back and it sounded like someone picked up but was unable to hear anything, then the phone cut off. Will attempt to reach patient later.

## 2018-07-28 NOTE — Telephone Encounter (Signed)
Options for gout include talking with PMD to change his gout medication All diuretics will likely have the same problem He needs to monitor fluid and sat intake He could try small amts of diuretics daily rather than larger amounts several times a week Lastly we could try an alterate diuretic such as torsemide 20 mg as needed, will likely have same effect

## 2018-07-30 NOTE — Telephone Encounter (Signed)
S/w patient. He said he did get some of the information the other day when the phone was breaking up. He said over the weekend he was up to 15 pounds over the weekend, took furosemide 80 mg and his weight the next morning was down 15 pounds. Since the weekend, he has gone up another 5-6 pounds.  He asked if Dr Mariah MillingGollan had heard from Dr Kathi LudwigSyed regarding his plan for diuretics.  I could not find any documentation at this time. Patient got frustrated that he feels like he is stuck and can not get any doctor to communicate and help to figure out what can help him. Patient is very upset with the current state of the medical field and insurance companies in general.   Patient would like to try taking diuretic more frequently in small amounts. Advised patient to take furosemide 40 mg today and then I will route to Dr Mariah MillingGollan for further instructions on furosemide and potassium.

## 2018-08-01 NOTE — Telephone Encounter (Signed)
We are [facility] , they are not in the [facility] system/epic Would try lasix 20 to 40 daily with potassium If still with sx, could try torsemide, though suspect we would have the same issues

## 2018-08-01 NOTE — Telephone Encounter (Signed)
I left a message on the patient's identified voice mail (ok per DPR) of Dr. Windell HummingbirdGollan's recommendations. I advised that if he has the 80 mg tablet of lasix, this will most likely be hard to 1/4 of he wants to try the 20 mg dose of lasix. I have advised that he may call back Monday with what he chooses to do and if different strength tablet needs to be called in to the pharmacy, we can do that. I have asked that he take is potassium 20 meq once daily as is already on his medication list.

## 2018-08-11 ENCOUNTER — Telehealth: Payer: Self-pay

## 2018-08-11 NOTE — Telephone Encounter (Signed)
Copied from CRM [id number]. Topic: Quick Communication - Appointment Cancellation >> Aug 11, 2018  4:53 PM Raquel SarnaHayes, Teresa G wrote: Pt cancelled all future appt's w/ practice.  Pt wants to speak with Ulyess MortErin Alexander to discuss why leaving practice.

## 2018-08-14 ENCOUNTER — Ambulatory Visit: Payer: BLUE CROSS/BLUE SHIELD

## 2018-08-21 ENCOUNTER — Encounter: Payer: Self-pay | Admitting: Nurse Practitioner

## 2018-08-21 ENCOUNTER — Ambulatory Visit (INDEPENDENT_AMBULATORY_CARE_PROVIDER_SITE_OTHER): Payer: BLUE CROSS/BLUE SHIELD | Admitting: Nurse Practitioner

## 2018-08-21 VITALS — BP 150/98 | HR 60 | Resp 16 | Ht 72.0 in | Wt 291.0 lb

## 2018-08-21 DIAGNOSIS — I482 Chronic atrial fibrillation, unspecified: Secondary | ICD-10-CM

## 2018-08-21 DIAGNOSIS — I10 Essential (primary) hypertension: Secondary | ICD-10-CM

## 2018-08-21 DIAGNOSIS — Z7901 Long term (current) use of anticoagulants: Secondary | ICD-10-CM

## 2018-08-21 DIAGNOSIS — E119 Type 2 diabetes mellitus without complications: Secondary | ICD-10-CM | POA: Diagnosis not present

## 2018-08-21 DIAGNOSIS — Z7689 Persons encountering health services in other specified circumstances: Secondary | ICD-10-CM

## 2018-08-21 DIAGNOSIS — B353 Tinea pedis: Secondary | ICD-10-CM

## 2018-08-21 DIAGNOSIS — E7800 Pure hypercholesterolemia, unspecified: Secondary | ICD-10-CM

## 2018-08-21 DIAGNOSIS — Z9884 Bariatric surgery status: Secondary | ICD-10-CM

## 2018-08-21 MED ORDER — CLOTRIMAZOLE-BETAMETHASONE 1-0.05 % EX CREA: 1.0000 "application " | TOPICAL_CREAM | Freq: Two times a day (BID) | CUTANEOUS | 0 refills | Status: DC

## 2018-08-21 NOTE — Patient Instructions (Addendum)
[facility],   Thank you for coming in to clinic today.  1. Continue all medications without changes.  2. Continue heart monitoring with Dr. Mariah MillingGollan. - We [facility].  3. Continue gout monitoring with Dr. Kathi LudwigSyed We will try to get your last lab records from.  4. Get your labs drawn at [facility] on Tuesday.  Please schedule a follow-up appointment with Wilhelmina McardleLauren Jenavi Beedle, AGNP. Return in about 3 months (around 11/21/2018) for hypertension.  If you have any other questions or concerns, please feel free to call the clinic or send a message through MyChart. You may also schedule an earlier appointment if necessary.  You will receive a survey after today's visit either digitally by e-mail or paper by [employer] mail. Your experiences and feedback matter to us.  Please respond so we know how we are doing as we provide care for you.   Wilhelmina McardleLauren Cerita Rabelo, DNP, AGNP-BC Adult Gerontology Nurse Practitioner [facility]

## 2018-08-21 NOTE — Progress Notes (Signed)
Subjective:    Patient ID: Marco James [facility], male    DOB: June 06, 1956, 62 y.o.   MRN: [id number]  Marco James is a 62 y.o. male presenting on 08/21/2018 for Establish Care and History gastric bypass   HPI Establish Care New Provider Pt last seen by PCP Dr. Alphonsus Sias [facility]) about 1 year ago.  Obtain records from [facility].    Gout Patient has had chronic gout and is currently being managed by rheumatology - [facility] [address].  He is currently taking Krystexxa with flares after every infusion.  He is not currently on any Colcrys or allopurinol.  Plan will be to resume allopurinol after Krystexxa.   Diabetes Patient is adamant that he no longer has diabetes.  Last fasting glucose after breakfast = 106, 95-96 at home, last A1c=5.4.  All lab values are per patient report.  Patient does continue to have neuropathy as result of his previously uncontrolled diabetes.  Otherwise, patient is not symptomatic.  He has had no hypoglycemia events off of medications.  History of gastric bypass Patient has lost over 100 pounds in the last year and has now had resolution of elevated blood sugars.  He is no longer taking any medications for diabetes.  Patient does follow post gastric surgery bypass diet with the exception of alcohol.  He is continuing to lose weight.  He is not currently taking any of his nutritional supplements.  He has not had any labs collected for nutritional deficiencies in recent months.  Diastolic CHF - Takes furosemide 40 daily instead of the 80 mg that is prescribed.   Patient is currently followed by Dr. Mariah Milling [facility].  Previously, patient's anticoagulation was managed by primary care.  Will make referral to [facility] heart care Coumadin clinic as he is taking this for his Afib.  Patient is not happy about monthly INR checks.   -Patient is not currently taking daily weights for the purposes of CHF management.  He states it is very  difficult to do daily weights as he is continuing to lose weight from his gastric bypass surgery.  Hypertension - He is not checking BP at home or outside of clinic.    - Current medications: lisinopril, metoprolol, doxazosin, tolerating well without side effects - He is not currently symptomatic. - Pt denies headache, lightheadedness, dizziness, changes in vision, chest tightness/pressure, palpitations, leg swelling, sudden loss of speech or loss of consciousness. - He  reports no regular exercise routine. - His diet is moderate in salt, moderate in fat, and moderate in carbohydrates.  - He is not forthcoming with his alcohol intake.  Suspect patient is drinking at least 1/5 of liquor per week and is also consuming alcohol away from home.  Hyperlipidemia Patient is not currently on any lipid-lowering agents.  He has history of diabetes and CHF.  - Pt denies changes in vision, chest tightness/pressure, palpitations, shortness of breath, leg pain while walking, leg or arm weakness, and sudden loss of speech or loss of consciousness.   Past Medical History:  Diagnosis Date  . A-fib (HCC)   . Arthritis   . Cellulitis and abscess of leg 2009  . CHF (congestive heart failure) (HCC)   . Dupuytren's disease   . Dyspnea    at times  . Dysrhythmia   . External hemorrhoids   . Gout   . HTN (hypertension)   . Hyperlipidemia   . Hyperuricemia   . Obstructive sleep apnea  cpap  . Pneumonia   . Polysubstance abuse (HCC)   . Toenail fungus   . Type 2 diabetes mellitus (HCC)    type 2, diet controlled after gastric band surgery   Past Surgical History:  Procedure Laterality Date  . COLONOSCOPY WITH PROPOFOL N/A 10/25/2015   Procedure: COLONOSCOPY WITH PROPOFOL;  Surgeon: Midge Minium, MD;  Location: [facility] ENDOSCOPY;  Service: Endoscopy;  Laterality: N/A;  . LAPAROSCOPIC GASTRIC SLEEVE RESECTION N/A 11/18/2017   Procedure: LAPAROSCOPIC GASTRIC SLEEVE RESECTION WITH UPPER ENDOSCOPY;   Surgeon: Luretha Murphy, MD;  Location: [facility];  Service: General;  Laterality: N/A;  . LAPAROSCOPIC GASTRIC SLEEVE RESECTION     11/18/17 Dr. Daphine Deutscher  . NASAL SINUS SURGERY    . WISDOM TOOTH EXTRACTION     Social History   Socioeconomic History  . Marital status: Divorced    Spouse name: Not on file  . Number of children: 1  . Years of education: Not on file  . Highest education level: Associate degree: occupational, Scientist, product/process development, or vocational program  Occupational History  . Occupation: Toolmaker    Comment: self employed  Social Needs  . Financial resource strain: Not on file  . Food insecurity:    Worry: Not on file    Inability: Not on file  . Transportation needs:    Medical: Not on file    Non-medical: Not on file  Tobacco Use  . Smoking status: Former Smoker    Packs/day: 0.50    Years: 25.00    Pack years: 12.50    Types: Cigarettes  . Smokeless tobacco: Former Neurosurgeon    Quit date: 11/08/1991  Substance and Sexual Activity  . Alcohol use: Yes    Alcohol/week: 16.0 standard drinks    Types: 16 Shots of liquor per week    Frequency: Never  . Drug use: Yes    Types: Marijuana    Comment: Pt. used cocaine for 3 years, marijuana--long ago  . Sexual activity: Not Currently  Lifestyle  . Physical activity:    Days per week: 0 days    Minutes per session: 0 min  . Stress: Not on file  Relationships  . Social connections:    Talks on phone: Not on file    Gets together: Not on file    Attends religious service: Not on file    Active member of club or organization: Not on file    Attends meetings of clubs or organizations: Not on file    Relationship status: Not on file  . Intimate partner violence:    Fear of current or ex partner: Not on file    Emotionally abused: Not on file    Physically abused: Not on file    Forced sexual activity: Not on file  Other Topics Concern  . Not on file  Social History Narrative   1 son in [place]   Family History    Problem Relation Age of Onset  . Heart disease Father 44   Current Outpatient Medications on File Prior to Visit  Medication Sig  . doxazosin (CARDURA) 8 MG tablet Take 1 tablet (8 mg total) by mouth daily.  . furosemide (LASIX) 80 MG tablet Take 1 tablet (80 mg total) by mouth daily as needed. (Patient taking differently: Take 40 mg by mouth daily as needed. )  . lisinopril (PRINIVIL,ZESTRIL) 40 MG tablet TAKE 1 TABLET BY MOUTH ONCE DAILY  . metoprolol tartrate (LOPRESSOR) 100 MG tablet take 1 tablet by mouth twice a  day (Patient taking differently: Take 200 mg by mouth once daily)  . pegloticase (KRYSTEXXA) 8 MG/ML injection Inject 8 mg into the vein every 14 (fourteen) days.  . potassium chloride SA (K-DUR,KLOR-CON) 20 MEQ tablet Take 20 mEq by mouth daily.   Marland Kitchen. warfarin (COUMADIN) 6 MG tablet Take 1 tablet (6 mg total) by mouth daily at 6 PM.  . colchicine 0.6 MG tablet Take 0.6 mg by mouth daily.   Marland Kitchen. warfarin (COUMADIN) 3 MG tablet Take 3 tablets (9 mg total) by mouth daily at 6 PM. (Patient not taking: Reported on 08/21/2018)   No current facility-administered medications on file prior to visit.     Review of Systems  Constitutional: Negative for activity change, appetite change, fatigue and unexpected weight change.  HENT: Negative for congestion, hearing loss and trouble swallowing.   Eyes: Negative for visual disturbance.  Respiratory: Positive for shortness of breath. Negative for cough, choking, chest tightness and wheezing.   Cardiovascular: Positive for leg swelling. Negative for chest pain and palpitations.  Gastrointestinal: Negative for abdominal pain, constipation and diarrhea.  Genitourinary: Negative for difficulty urinating, discharge, flank pain, genital sores, penile pain, penile swelling, scrotal swelling and testicular pain.  Musculoskeletal: Positive for arthralgias and gait problem. Negative for back pain and myalgias.  Skin: Positive for color change. Negative  for rash and wound.  Allergic/Immunologic: Negative for environmental allergies.  Neurological: Negative for dizziness, seizures, weakness and headaches.  Hematological: Negative for adenopathy. Does not bruise/bleed easily.  Psychiatric/Behavioral: Negative for behavioral problems, decreased concentration, dysphoric mood, sleep disturbance and suicidal ideas. The patient is not nervous/anxious.    Per HPI unless specifically indicated above     Objective:    BP (!) 150/98 (BP Location: Left Arm, Patient Position: Sitting, Cuff Size: Large)   Pulse 60   Resp 16   Ht 6' (1.829 m)   Wt 291 lb (132 kg)   SpO2 97%   BMI 39.47 kg/m   Wt Readings from Last 3 Encounters:  08/21/18 291 lb (132 kg)  04/17/18 (!) 309 lb (140.2 kg)  02/12/18 (!) 324 lb (147 kg)    Physical Exam  Constitutional: He is oriented to person, place, and time. He appears well-developed and well-nourished. No distress.  HENT:  Head: Normocephalic and atraumatic.  Neck: Normal range of motion. Neck supple. Carotid bruit is not present.  Cardiovascular: Normal rate, regular rhythm, S1 normal, S2 normal, normal heart sounds and intact distal pulses.  Pulmonary/Chest: Effort normal and breath sounds normal. No respiratory distress.  Abdominal: Soft. Bowel sounds are normal. He exhibits no distension. There is no hepatosplenomegaly. There is no tenderness. No hernia.  Musculoskeletal: He exhibits no edema (pedal).  Neurological: He is alert and oriented to person, place, and time.  Skin: Skin is warm and dry.  Dry, flaky white fungal appearance to all interdigital spaces, toes, heels bilaterally.  Fungal nail appearance to all toes, with long, thick, yellow/darkened toenails  Psychiatric: He has a normal mood and affect. His behavior is normal.  Vitals reviewed.    Results for orders placed or performed in visit on 07/10/18  POCT INR  Result Value Ref Range   INR 1.2 (A) 2.0 - 3.0      Assessment & Plan:    Problem List Items Addressed This Visit      Cardiovascular and Mediastinum   A-fib (HCC) Stable and rate controlled on exam today.  Continue metoprolol as prescribed.  Continue follow-up with [facility]. -Collect  INR with labs today as patient is due -Transition clinic to Coumadin clinic for routine INR checks and Coumadin management -Follow-up PRN   Relevant Orders   INR/PT   Essential (primary) hypertension Currently uncontrolled and above goal of less than 130/80.  Patient seems to be noncompliant with furosemide and is worsening hypertension with alcohol use. - Continue medications without change today as renal function is unknown. - Collect CMP with labs - Follow-up 3 months and with Dr. Mariah Milling as scheduled    Relevant Orders   Comprehensive metabolic panel     Endocrine   Diabetes mellitus type 2, diet-controlled (HCC) - Primary Discussed with patient that diabetes will be chronic condition.  Is currently controlled with diet and lifestyle status post gastric sleeve surgery.  She continues to have neuropathy as result with previously uncontrolled diabetes.  No recent hemoglobin A1c.   - Collect labs today.   - Continue to follow heart healthy diet low in refined carbohydrates - Continue to regular exercise as tolerated.  Encourage patient to consider water activities - Follow-up 3 months   Relevant Orders   Hemoglobin A1c     Other   HLD (hyperlipidemia) Status currently unknown as patient has not had recent lipid panel.  Is not on any lipid-lowering agents.  No recent ASCVD events. -Collect lipid panel and CMP -Consider adding statin medication for LDL goal less than 70 with known CHF and DM. -Follow-up after labs as needed.   Relevant Orders   Lipid panel   Comprehensive metabolic panel   S/P laparoscopic sleeve gastrectomyNov2018 Patient without recent follow-up after gastrectomy.  Is not currently taking nutritional supplements as recommended.  Is generally  adhering to proper diet, but is adding alcohol in excess. -Collect labs for nutritional deficiencies, CBC, hemoglobin A1c. -Follow-up 3 months and as needed   Relevant Orders   Hemoglobin A1c   CBC with Differential/Platelet   VITAMIN D 25 Hydroxy (Vit-D Deficiency, Fractures)   TSH   B12    Other Visit Diagnoses    Encounter to establish care     Previous PCP was at Dr. Alphonsus [facility].  Records are reviewed today in [facility].  Past medical, family, and surgical history reviewed w/ pt. Patient has had new PCP every year since 2015.      Current use of long term anticoagulation     See AP for afib above   Relevant Orders   INR/PT   Tinea pedis of both feet     Chronic tinea pedis.  Fungal nail syndrome also present.  Skin currently intact without abrasions or wounds. -Start Lotrisone cream application twice daily x14 days. -Consider podiatry referral - Encouraged patient to perform proper foot care -Follow-up PRN   Relevant Medications   clotrimazole-betamethasone (LOTRISONE) cream      Meds ordered this encounter  Medications  . clotrimazole-betamethasone (LOTRISONE) cream    Sig: Apply 1 application topically 2 (two) times daily.    Dispense:  30 g    Refill:  0    Order Specific Question:   Supervising Provider    Answer:   Smitty Cords [[id number]]     Follow up plan: Return in about 3 months (around 11/21/2018) for hypertension.  Wilhelmina Mcardle, DNP, AGPCNP-BC Adult Gerontology Primary Care Nurse Practitioner [facility] 08/21/2018, 6:07 PM

## 2018-08-27 ENCOUNTER — Other Ambulatory Visit: Payer: Self-pay | Admitting: Nurse Practitioner

## 2018-08-27 ENCOUNTER — Ambulatory Visit (INDEPENDENT_AMBULATORY_CARE_PROVIDER_SITE_OTHER): Payer: BLUE CROSS/BLUE SHIELD | Admitting: Nurse Practitioner

## 2018-08-27 DIAGNOSIS — I482 Chronic atrial fibrillation, unspecified: Secondary | ICD-10-CM

## 2018-08-27 LAB — CBC WITH DIFFERENTIAL/PLATELET
Basophils Absolute: 0 10*3/uL (ref 0.0–0.2)
Basos: 0 %
EOS (ABSOLUTE): 0.1 10*3/uL (ref 0.0–0.4)
Eos: 1 %
Hematocrit: 39.5 % (ref 37.5–51.0)
Hemoglobin: 13.4 g/dL (ref 13.0–17.7)
Immature Grans (Abs): 0 10*3/uL (ref 0.0–0.1)
Immature Granulocytes: 0 %
Lymphocytes Absolute: 1.5 10*3/uL (ref 0.7–3.1)
Lymphs: 20 %
MCH: 30.2 pg (ref 26.6–33.0)
MCHC: 33.9 g/dL (ref 31.5–35.7)
MCV: 89 fL (ref 79–97)
Monocytes Absolute: 0.4 10*3/uL (ref 0.1–0.9)
Monocytes: 6 %
Neutrophils Absolute: 5.4 10*3/uL (ref 1.4–7.0)
Neutrophils: 73 %
Platelets: 152 10*3/uL (ref 150–450)
RBC: 4.43 x10E6/uL (ref 4.14–5.80)
RDW: 14.2 % (ref 12.3–15.4)
WBC: 7.4 10*3/uL (ref 3.4–10.8)

## 2018-08-27 LAB — COMPREHENSIVE METABOLIC PANEL
ALT: 10 IU/L (ref 0–44)
AST: 12 IU/L (ref 0–40)
Albumin/Globulin Ratio: 2.3 — ABNORMAL HIGH (ref 1.2–2.2)
Albumin: 4.1 g/dL (ref 3.6–4.8)
Alkaline Phosphatase: 51 IU/L (ref 39–117)
BUN/Creatinine Ratio: 25 — ABNORMAL HIGH (ref 10–24)
BUN: 27 mg/dL (ref 8–27)
Bilirubin Total: 0.7 mg/dL (ref 0.0–1.2)
CO2: 25 mmol/L (ref 20–29)
Calcium: 9.2 mg/dL (ref 8.6–10.2)
Chloride: 103 mmol/L (ref 96–106)
Creatinine, Ser: 1.08 mg/dL (ref 0.76–1.27)
GFR calc Af Amer: 86 mL/min/{1.73_m2} (ref >59)
GFR calc non Af Amer: 74 mL/min/{1.73_m2} (ref >59)
Globulin, Total: 1.8 g/dL (ref 1.5–4.5)
Glucose: 100 mg/dL — ABNORMAL HIGH (ref 65–99)
Potassium: 4.4 mmol/L (ref 3.5–5.2)
Sodium: 141 mmol/L (ref 134–144)
Total Protein: 5.9 g/dL — ABNORMAL LOW (ref 6.0–8.5)

## 2018-08-27 LAB — LIPID PANEL
Chol/HDL Ratio: 3.8 ratio (ref 0.0–5.0)
Cholesterol, Total: 195 mg/dL (ref 100–199)
HDL: 51 mg/dL (ref >39)
LDL Calculated: 127 mg/dL — ABNORMAL HIGH (ref 0–99)
Triglycerides: 83 mg/dL (ref 0–149)
VLDL Cholesterol Cal: 17 mg/dL (ref 5–40)

## 2018-08-27 LAB — VITAMIN D 25 HYDROXY (VIT D DEFICIENCY, FRACTURES): Vit D, 25-Hydroxy: 49.7 ng/mL (ref 30.0–100.0)

## 2018-08-27 LAB — HEMOGLOBIN A1C
Est. average glucose Bld gHb Est-mCnc: 91 mg/dL
Hgb A1c MFr Bld: 4.8 % (ref 4.8–5.6)

## 2018-08-27 LAB — VITAMIN B12: Vitamin B-12: 467 pg/mL (ref 232–1245)

## 2018-08-27 LAB — PROTIME-INR
INR: 1.8 — ABNORMAL HIGH (ref 0.8–1.2)
Prothrombin Time: 18.4 s — ABNORMAL HIGH (ref 9.1–12.0)

## 2018-08-27 LAB — TSH: TSH: 2.73 u[IU]/mL (ref 0.450–4.500)

## 2018-08-27 NOTE — Progress Notes (Signed)
Called patient without answer until 09/01/2018.   Plan: INR goal: 2.5 (range 2.0-3.0) Patient with subtherapeutic INR.  Increase by 10% per week = +4 mg per week. Provider's Preferred dosing: Take warfarin 6 mg tablet daily plus additional 1 mg tablet on Monday, Tuesday, Thursday, and Friday.   Patient refuses to change to additional tablets 4 days per week.  Patient prefers 9 mg on Mondays and 6 mg every other day and has already made this change starting today 09/01/2018.  Patient to have INR recheck 2 weeks - was declined by patient.  Will check in 1 month.  Continue with plan for cardiology to resume anticoag clinic.  Called [facility] to ensure referral was received.  Scheduled for repeat INR this week. [facility] will call patient.

## 2018-08-29 MED ORDER — WARFARIN SODIUM 1 MG PO TABS: ORAL_TABLET | ORAL | 1 refills | Status: DC

## 2018-09-01 NOTE — Patient Instructions (Signed)
Marco James [facility],   Thank you for coming in to clinic today.  1. Increase dose to 9 mg warfarin (1.5 tablets total) on Mondays and 6 mg (1 tablet) every other day of the week as you have requested.    2. Repeat INR in 1 month.  If you have any other questions or concerns, please feel free to call the clinic or send a message through MyChart. You may also schedule an earlier appointment if necessary.  You will receive a survey after today's visit either digitally by e-mail or paper by [employer]. Your experiences and feedback matter to [place].  Please respond so we know how we are doing as we provide care for you.   Wilhelmina Mcardle, DNP, AGNP-BC Adult Gerontology Nurse Practitioner [facility], [facility]

## 2018-09-04 ENCOUNTER — Ambulatory Visit: Payer: BLUE CROSS/BLUE SHIELD | Admitting: Internal Medicine

## 2018-09-08 ENCOUNTER — Ambulatory Visit (INDEPENDENT_AMBULATORY_CARE_PROVIDER_SITE_OTHER): Payer: BLUE CROSS/BLUE SHIELD

## 2018-09-08 ENCOUNTER — Ambulatory Visit: Payer: BLUE CROSS/BLUE SHIELD | Admitting: Nurse Practitioner

## 2018-09-08 DIAGNOSIS — Z5181 Encounter for therapeutic drug level monitoring: Secondary | ICD-10-CM

## 2018-09-08 DIAGNOSIS — I482 Chronic atrial fibrillation, unspecified: Secondary | ICD-10-CM

## 2018-09-08 DIAGNOSIS — Z9884 Bariatric surgery status: Secondary | ICD-10-CM

## 2018-09-08 LAB — POCT INR: INR: 2.1 (ref 2.0–3.0)

## 2018-09-08 NOTE — Patient Instructions (Signed)
Please continue dosage of 6 mg every day except 9 mg on Mondays. Recheck INR in 6 weeks.

## 2018-09-09 ENCOUNTER — Other Ambulatory Visit: Payer: Self-pay

## 2018-09-09 ENCOUNTER — Ambulatory Visit (INDEPENDENT_AMBULATORY_CARE_PROVIDER_SITE_OTHER): Payer: BLUE CROSS/BLUE SHIELD | Admitting: Nurse Practitioner

## 2018-09-09 ENCOUNTER — Encounter: Payer: Self-pay | Admitting: Nurse Practitioner

## 2018-09-09 VITALS — BP 134/83 | HR 85 | Temp 98.7°F | Ht 72.0 in | Wt 278.0 lb

## 2018-09-09 DIAGNOSIS — N453 Epididymo-orchitis: Secondary | ICD-10-CM

## 2018-09-09 LAB — POCT URINALYSIS DIPSTICK
Bilirubin, UA: NEGATIVE
Glucose, UA: NEGATIVE
Ketones, UA: NEGATIVE
Nitrite, UA: NEGATIVE
Protein, UA: NEGATIVE
Spec Grav, UA: 1.01 (ref 1.010–1.025)
Urobilinogen, UA: 0.2 E.U./dL
pH, UA: 5 (ref 5.0–8.0)

## 2018-09-09 MED ORDER — AZITHROMYCIN 1 G PO PACK: 1.0000 g | PACK | Freq: Once | ORAL | 0 refills | Status: DC

## 2018-09-09 MED ORDER — DOXYCYCLINE HYCLATE 100 MG PO TABS: 100.0000 mg | ORAL_TABLET | Freq: Two times a day (BID) | ORAL | 0 refills | Status: DC

## 2018-09-09 NOTE — Progress Notes (Signed)
Subjective:    Patient ID: Marco James, male    DOB: 1956/04/28, 62 y.o.   MRN: [id number]  Marco James is a 62 y.o. male presenting on 09/09/2018 for Groin Swelling (swelling, pain in the scrtum area x 6 days )   HPI Groin Swelling, R testicle Patient reports pain and swelling in scrotum area for last 6 days.   - Pain is improving, now is deep aching.  Painful to move (stand, sit, get on a motorcycle).  Continues to ride his motorcycle.  Has had chills, feeling cold.   - No known redness, but difficult to see. - No changes in urination, but is having frequent urination (more than usual with diuretics). - Strains some with BM, leaks semen.  Semen was dark/bloody.  Also seeing change in ejaculate color with brown color even with a slightly larger ejaculate volume.  Is also very painful. - Has had prostatitis in past.  - Patient wearing boxer shorts, rides motorcycle daily.   Social History   Tobacco Use  . Smoking status: Former Smoker    Packs/day: 0.50    Years: 25.00    Pack years: 12.50    Types: Cigarettes    Last attempt to quit: 09/09/2008    Years since quitting: 10.0  . Smokeless tobacco: Former NeurosurgeonUser    Quit date: 11/08/1991  Substance Use Topics  . Alcohol use: Yes    Alcohol/week: 16.0 standard drinks    Types: 16 Shots of liquor per week    Frequency: Never  . Drug use: Yes    Types: Marijuana    Comment: Pt. used cocaine for 3 years, marijuana--long ago    Review of Systems Per HPI unless specifically indicated above     Objective:    BP 134/83 (BP Location: Right Arm, Patient Position: Sitting, Cuff Size: Normal)   Pulse 85   Temp 98.7 F (37.1 C) (Oral)   Ht 6' (1.829 m)   Wt 278 lb (126.1 kg)   BMI 37.70 kg/m   Wt Readings from Last 3 Encounters:  09/09/18 278 lb (126.1 kg)  08/21/18 291 lb (132 kg)  04/17/18 (!) 309 lb (140.2 kg)    Physical Exam  Constitutional: He is oriented to person, place, and time. He appears well-developed  and well-nourished. No distress.  HENT:  Head: Normocephalic and atraumatic.  Genitourinary:  Genitourinary Comments: Genital and Rectal Exam chaperoned by Laurel DimmerKelita Russell, CMA Genital: penis normal shape without lesions or urethral discharge, scrotum enlarged with erythema, tenderness, edema of entire scrotal sac.  Spermatic cords palpated without edema or tenderness,epididymis palpated and is enlarged. RIGHT testicle enlarged and tender to palpation.  No inguinal hernia or lymphadenopathy.  Neurological: He is alert and oriented to person, place, and time.  Skin: Skin is warm and dry.  Psychiatric: His mood appears anxious. He is agitated.  Vitals reviewed.    Results for orders placed or performed in visit on 09/08/18  POCT INR  Result Value Ref Range   INR 2.1 2.0 - 3.0      Assessment & Plan:   Problem List Items Addressed This Visit    None    Visit Diagnoses    Epididymo-orchitis, acute    -  Primary   Relevant Orders   POCT Urinalysis Dipstick (Completed)   Urine Culture (Completed)   Ambulatory referral to Urology     Presumed epididymo-orchitis based on exam.  START azithromycin 1 g x 1 dose to treat most common  cause of infection (NGU).  Also start doxycycline 100 mg bid x 10 days. - Wear supportive underwear - Avoid motorcycle riding - Referral urology. - Follow-up prn.  Meds ordered this encounter  Medications  .  azithromycin (ZITHROMAX) 1 g powder    Sig: Take 1 packet (1 g total) by mouth once for 1 dose.    Dispense:  1 each    Refill:  0    Order Specific Question:   Supervising Provider    Answer:   Smitty Cords [[id number]]  .  doxycycline (VIBRA-TABS) 100 MG tablet    Sig: Take 1 tablet (100 mg total) by mouth 2 (two) times daily.    Dispense:  20 tablet    Refill:  0    Order Specific Question:   Supervising Provider    Answer:   Smitty Cords [[id number]]    Follow up plan: Return 5-7 days if symptoms worsen or fail to  improve.  Wilhelmina Mcardle, DNP, AGPCNP-BC Adult Gerontology Primary Care Nurse Practitioner [facility] 09/09/2018, 11:47 AM

## 2018-09-09 NOTE — Patient Instructions (Addendum)
Marco James,   Thank you for coming in to clinic today.  1. You have epididymo-orchitis.  This is caused by bacteria.  We will send your urine for a culture to identify which bacteria and make sure you are getting the proper antibiotic treatment.  Today, take azithromycin 1 g for one dose  ALSO start doxycycline 100 mg one tablet twice daily.    2. Wear supportive, brief underwear.  3. DO NOT ride your motorcycle until infection and swelling resolves.  4. Urology will also call to schedule an appointment with you.  Please schedule a follow-up appointment with Wilhelmina McardleLauren Creg Gilmer, AGNP. Return 5-7 days if symptoms worsen or fail to improve.  If you have any other questions or concerns, please feel free to call the clinic or send a message through MyChart. You may also schedule an earlier appointment if necessary.  You will receive a survey after today's visit either digitally by e-mail or paper by [employer] mail. Your experiences and feedback matter to us.  Please respond so we know how we are doing as we provide care for you.   Wilhelmina McardleLauren Quinnlan Abruzzo, DNP, AGNP-BC Adult Gerontology Nurse Practitioner [facility]   Epididymitis Epididymitis is swelling (inflammation) of the epididymis. The epididymis is a cord-like structure that is located along the top and back part of the testicle. It collects and stores sperm from the testicle. This condition can also cause pain and swelling of the testicle and scrotum. Symptoms usually start suddenly (acute epididymitis). Sometimes epididymitis starts gradually and lasts for a while (chronic epididymitis). This type may be harder to treat. What are the causes? In men 4535 and younger, this condition is usually caused by a bacterial infection or sexually transmitted disease (STD), such as:  Gonorrhea.  Chlamydia.  In men 2935 and older who do not have anal sex, this condition is usually caused by bacteria from a blockage or abnormalities  in the urinary system. These can result from:  Having a tube placed into the bladder (urinary catheter).  Having an enlarged or inflamed prostate gland.  Having recent urinary tract surgery.  In men who have a condition that weakens the body's defense system (immune system), such as HIV, this condition can be caused by:  Other bacteria, including tuberculosis and syphilis.  Viruses.  Fungi.  Sometimes this condition occurs without infection. That may happen if urine flows backward into the epididymis after heavy lifting or straining. What increases the risk? This condition is more likely to develop in men:  Who have unprotected sex with more than one partner.  Who have anal sex.  Who have recently had surgery.  Who have a urinary catheter.  Who have urinary problems.  Who have a suppressed immune system.  What are the signs or symptoms? This condition usually begins suddenly with chills, fever, and pain behind the scrotum and in the testicle. Other symptoms include:  Swelling of the scrotum, testicle, or both.  Pain whenejaculatingor urinating.  Pain in the back or belly.  Nausea.  Itching and discharge from the penis.  Frequent need to pass urine.  Redness and tenderness of the scrotum.  How is this diagnosed? Your health care provider can diagnose this condition based on your symptoms and medical history. Your health care provider will also do a physical exam to ask about your symptoms and check your scrotum and testicle for swelling, pain, and redness. You may also have other tests, including:  Examination of discharge from the  penis.  Urine tests for infections, such as STDs.  Your health care provider may test you for other STDs, including HIV. How is this treated? Treatment for this condition depends on the cause. If your condition is caused by a bacterial infection, oral antibiotic medicine may be prescribed. If the bacterial infection has spread to  your blood, you may need to receive IV antibiotics. Nonbacterial epididymitis is treated with home care that includes bed rest and elevation of the scrotum. Surgery may be needed to treat:  Bacterial epididymitis that causes pus to build up in the scrotum (abscess).  Chronic epididymitis that has not responded to other treatments.  Follow these instructions at home: Medicines  Take over-the-counter and prescription medicines only as told by your health care provider.  If you were prescribed an antibiotic medicine, take it as told by your health care provider. Do not stop taking the antibiotic even if your condition improves. Sexual Activity  If your epididymitis was caused by an STD, avoid sexual activity until your treatment is complete.  Inform your sexual partner or partners if you test positive for an STD. They may need to be treated.Do not engage in sexual activity with your partner or partners until their treatment is completed. General instructions  Return to your normal activities as told by your health care provider. Ask your health care provider what activities are safe for you.  Keep your scrotum elevated and supported while resting. Ask your health care provider if you should wear a scrotal support, such as a jockstrap. Wear it as told by your health care provider.  If directed, apply ice to the affected area: ? Put ice in a plastic bag. ? Place a towel between your skin and the bag. ? Leave the ice on for 20 minutes, 2-3 times per day.  Try taking a sitz bath to help with discomfort. This is a warm water bath that is taken while you are sitting down. The water should only come up to your hips and should cover your buttocks. Do this 3-4 times per day or as told by your health care provider.  Keep all follow-up visits as told by your health care provider. This is important. Contact a health care provider if:  You have a fever.  Your pain medicine is not  helping.  Your pain is getting worse.  Your symptoms do not improve within three days. This information is not intended to replace advice given to you by your health care provider. Make sure you discuss any questions you have with your health care provider. Document Released: 12/07/2000 Document Revised: 05/17/2016 Document Reviewed: 04/27/2015 Elsevier Interactive Patient Education  2018 [employer].

## 2018-09-11 LAB — URINE CULTURE
MICRO NUMBER:: 91114159
SPECIMEN QUALITY:: ADEQUATE

## 2018-09-12 ENCOUNTER — Encounter: Payer: BLUE CROSS/BLUE SHIELD | Admitting: Internal Medicine

## 2018-09-12 MED ORDER — CEPHALEXIN 500 MG PO CAPS: 500.0000 mg | ORAL_CAPSULE | Freq: Three times a day (TID) | ORAL | 0 refills | Status: AC

## 2018-09-24 ENCOUNTER — Ambulatory Visit (INDEPENDENT_AMBULATORY_CARE_PROVIDER_SITE_OTHER): Payer: BLUE CROSS/BLUE SHIELD

## 2018-09-24 DIAGNOSIS — Z9884 Bariatric surgery status: Secondary | ICD-10-CM

## 2018-09-24 DIAGNOSIS — Z5181 Encounter for therapeutic drug level monitoring: Secondary | ICD-10-CM

## 2018-09-24 DIAGNOSIS — I482 Chronic atrial fibrillation, unspecified: Secondary | ICD-10-CM

## 2018-09-24 LAB — POCT INR: INR: 2.5 (ref 2.0–3.0)

## 2018-09-24 NOTE — Patient Instructions (Signed)
Please continue dosage of 6 mg every day except 9 mg on Mondays. Recheck INR in 6 weeks. (per pt request)

## 2018-10-07 ENCOUNTER — Other Ambulatory Visit: Payer: Self-pay | Admitting: Internal Medicine

## 2018-10-07 DIAGNOSIS — I10 Essential (primary) hypertension: Secondary | ICD-10-CM

## 2018-10-10 ENCOUNTER — Ambulatory Visit: Payer: BLUE CROSS/BLUE SHIELD | Admitting: Urology

## 2018-10-13 ENCOUNTER — Other Ambulatory Visit: Payer: Self-pay | Admitting: Nurse Practitioner

## 2018-10-13 DIAGNOSIS — I10 Essential (primary) hypertension: Secondary | ICD-10-CM

## 2018-10-13 MED ORDER — METOPROLOL TARTRATE 100 MG PO TABS: 100.0000 mg | ORAL_TABLET | Freq: Two times a day (BID) | ORAL | 3 refills | Status: DC

## 2018-10-13 NOTE — Telephone Encounter (Signed)
Pt called the coumadin clinic stating that he called his pharmacy last week to get refill on metoprolol tartrate 100 mg BID, but did not hear anything back.  He called back and asked about the status and they told him they were waiting to hear back from Marco James office.  Pt advised them that he no longer sees Marco James and to send it to Marco Mcardle, NP. Advised pt that I see that request was sent to her office this am @ 11:24. Pt is upset, as this message was given to them last week and they waited until today to call their office and Marco James is out of the office this week. Advised pt that I will send refills in to his pharmacy (he is pt of Marco James), as he took his last pill this am and pt is easily agitated.  He is appreciative and will call back if we can be of further assistance.

## 2018-10-13 NOTE — Telephone Encounter (Signed)
Pt called requesting refill on metoprolol called into  [facility], on [address] of [address] Pt also was requesting sleep study. Pt call back # is [phone]

## 2018-10-15 ENCOUNTER — Encounter: Payer: Self-pay | Admitting: Skilled Nursing Facility1

## 2018-10-15 ENCOUNTER — Encounter: Payer: BLUE CROSS/BLUE SHIELD | Attending: Surgery | Admitting: Skilled Nursing Facility1

## 2018-10-15 DIAGNOSIS — E669 Obesity, unspecified: Secondary | ICD-10-CM | POA: Diagnosis present

## 2018-10-15 DIAGNOSIS — N183 Chronic kidney disease, stage 3 unspecified: Secondary | ICD-10-CM

## 2018-10-15 DIAGNOSIS — Z713 Dietary counseling and surveillance: Secondary | ICD-10-CM | POA: Diagnosis not present

## 2018-10-15 NOTE — Progress Notes (Signed)
Follow-up visit: Post-Operative Sleeve Surgery  Primary concerns today: Post-operative Bariatric Surgery Nutrition Management.  Pt states he does not ever want to do the tanita.   Pt states he does work with Dr. Cyndia Skeeters which is his friend now.  Pt states he wants to get rid of his ecxess skin. Pt states he has been taking half a water pill and still feels he is gaining fluid. Pt states he had cheesecake for his birthday. Pt states his step dads birthday is the same day as his. Pt states his grandkids are very sweet and call him poppagerri which he loves. Pt states he checks his blood sugars occassionally being about 90-94 with a n A1C 4.8. Pt states he gets mad at his doctors for not understanding he knows his body best. Pt state she is working on documenting for a paniculectomy. Pt states he is no longer taking the multivitamin or calcium. Pt agreed to take tums. Pt states he has not had a flare up of gout in about 6-7 weeks.   Surgery date: 11/18/2017 Surgery type: Sleeve Start weight at [facility]: 391 Weight today:pt declined   24-hr recall: sausage, egg, cheese; eats out mst meals seafood and such with meat and vegetables, pt states he eats a lot of peanut butter B (AM): 2 eggs and 1 [place] sausage link or 2 sausage biscuits without the bread and tomatoes  Snk (AM):  cheese L (PM): half chili from [place] or grilled chicken breast or wendys double cheese burgers without the bun or fish and vegetables with hush puppies Snk (PM): eggs D (PM): hamburger steak or pan fried salmon or [place] burger and canned butter beans Snk (PM): rest of hamburger patty or chicken breast  Fluid intake: water, 1% milk, sugar free natures twist: 64+ Estimated total protein intake: 60+  Medications: no longer taking diabetes medications  Supplementation: not taking  Using straws: no Drinking while eating: no Having you been chewing well: yes  Chewing/swallowing difficulties: no Changes in vision: no Changes  to mood/headaches: no Hair loss/Cahnges to skin/Changes to nails: no Any difficulty focusing or concentrating: no Sweating: no Dizziness/Lightheaded: no Palpitations: no  Carbonated beverages: no N/V/D/C/GAS: no Abdominal Pain: no Dumping syndrome: no  Recent physical activity:  ADL's  Progress Towards Goal(s):  In progress.    Nutritional Diagnosis:  Byers-3.3 Overweight/obesity related to past poor dietary habits and physical inactivity as evidenced by patient w/ recent sleeve surgery following dietary guidelines for continued weight loss.    Intervention:  Nutrition counseling. -continue to eat every 3-5 hours and with balance: complex carbohydrates, lean protein, non starchy vegetables -Be active for 30 minutes every day -get in 70 ounces of fluid at least half being plain water  Teaching Method Utilized:  Visual Auditory Hands on  Barriers to learning/adherence to lifestyle change: none identified   Demonstrated degree of understanding via:  Teach Back   Monitoring/Evaluation:  Dietary intake, exercise, and body weight.

## 2018-11-03 ENCOUNTER — Telehealth: Payer: Self-pay

## 2018-11-03 NOTE — Telephone Encounter (Signed)
Patient declines x 2  to share reason for call requesting to speak with [facility] but states it is related to coumadin appt.

## 2018-11-03 NOTE — Telephone Encounter (Signed)
Spoke w/ pt.  He reports that he ran out of coumadin and was unable to get to the pharmacy before they closed on Friday, so the took 1/2 dose of coumadin on Friday and then nothing on Sat & Sun. He picked up rx today and took his 1.5 tabs as scheduled. He has an appt on Wed, 11/13 for INR check, but would like to push that out farther since he knows that his INR will be low. Advised pt to take extra 1/2 tablet today, extra whole tablet tomorrow, and then resume his previous dosage.  Rescheduled his INR check to [facility], Nov 18. Pt verbalizes understanding and is appreciative of the call.

## 2018-11-12 ENCOUNTER — Ambulatory Visit (INDEPENDENT_AMBULATORY_CARE_PROVIDER_SITE_OTHER): Payer: BLUE CROSS/BLUE SHIELD

## 2018-11-12 DIAGNOSIS — Z5181 Encounter for therapeutic drug level monitoring: Secondary | ICD-10-CM

## 2018-11-12 DIAGNOSIS — Z9884 Bariatric surgery status: Secondary | ICD-10-CM

## 2018-11-12 DIAGNOSIS — I482 Chronic atrial fibrillation, unspecified: Secondary | ICD-10-CM

## 2018-11-12 LAB — POCT INR: INR: 1.6 — AB (ref 2.0–3.0)

## 2018-11-12 NOTE — Patient Instructions (Signed)
Please take extra tablet tonight, then continue dosage of 6 mg every day except 9 mg on Mondays. Recheck INR in 6 weeks.

## 2018-11-14 ENCOUNTER — Encounter: Payer: Self-pay | Admitting: Nurse Practitioner

## 2018-11-24 ENCOUNTER — Other Ambulatory Visit: Payer: Self-pay | Admitting: Internal Medicine

## 2018-11-28 ENCOUNTER — Telehealth: Payer: Self-pay

## 2018-11-28 ENCOUNTER — Other Ambulatory Visit: Payer: Self-pay

## 2018-11-28 DIAGNOSIS — Z9884 Bariatric surgery status: Secondary | ICD-10-CM

## 2018-11-28 DIAGNOSIS — I5032 Chronic diastolic (congestive) heart failure: Secondary | ICD-10-CM

## 2018-11-28 MED ORDER — POTASSIUM CHLORIDE CRYS ER 20 MEQ PO TBCR: 40.0000 meq | EXTENDED_RELEASE_TABLET | Freq: Two times a day (BID) | ORAL | 0 refills | Status: DC

## 2018-11-28 MED ORDER — FUROSEMIDE 40 MG PO TABS: 40.0000 mg | ORAL_TABLET | Freq: Every day | ORAL | 0 refills | Status: DC

## 2018-11-28 MED ORDER — POTASSIUM CHLORIDE CRYS ER 20 MEQ PO TBCR: 20.0000 meq | EXTENDED_RELEASE_TABLET | Freq: Two times a day (BID) | ORAL | 0 refills | Status: DC

## 2018-11-28 NOTE — Telephone Encounter (Signed)
Covering inbox for Wilhelmina McardleLauren Kennedy, AGPCNP-BC while she is out of office.  Unfortunately now my name will be on his rx due to the timing of his request, however I spoke with patient as requested. I advised him we can send 90 day supply for these two and then Lauren should get the refill request now.  I changed his Furosemide pill from HALF of an 80mg  = 40mg  - NOW to a Furosemide 40mg  daily (take whole pill) - he takes it regularly, rarely misses it actually.  Corrected potassium dosage, he takes it 20mg  x 2 pills = 40mg  TWICE a day. - sent 90 day supply  ------- Forward this message to PCP Wilhelmina McardleLauren Kennedy, AGPCNP-BC. Patient requested all other refills to be sent within 1-2 weeks before end of year so that they will be available and in Lauren's name.  Saralyn PilarAlexander Dezman Granda, DO [facility] 11/28/2018, 6:03 PM

## 2018-11-28 NOTE — Telephone Encounter (Signed)
Patient walked in office today c/o of hard time getting refills since they are still listed under old provider.  I explained to patient that this would be an ongoing issue until his new provider had refilled all the medication under her name.    He is needing a refill on Furosemide 80mg  PRN and potassium 4 times a day.    [facility] [place]  PLEASE CALL PATIENT WHEN REFILL IS DONE

## 2018-11-28 NOTE — Telephone Encounter (Signed)
Patient walked in office today c/o of hard time getting refills since they are still listed under old provider.  I explained to patient that this would be an ongoing issue until his new provider had refilled all the medication under her name.    He is needing a refill on Furosemide 80mg PRN and potassium 4 times a day.    [facility] [place]  PLEASE CALL PATIENT WHEN REFILL IS DONE 

## 2018-12-01 NOTE — Telephone Encounter (Signed)
Mr. Martie LeeMaffeo was encouraged at his initial visit with me to resume visits with Dr. Mariah MillingGollan for his cardiac medications.  I continue to recommend this for him given he has atrial fibrillation (anticoag managed at [facility]) and Chronic diastolic HF.  Refills of all his medications will not be provided for any extended length of time as he needs to have repeat cardiac eval by Dr. Mariah MillingGollan and specialist recommendation for continuation of medications.  He will be due for followup in 01/2018 with Dr. Mariah MillingGollan.  As he needs refills prior to that appointment in February, he should contact his pharmacy and/or our office for refills on a case by case basis.  Patient has verbally told me in past he does not want to see Dr. Mariah MillingGollan, but this is required for him at this time.  - He also did not make his follow-up appointment for November 2019 as requested at initial visit.  He needs follow-up visit with me for chronic disease management including dmt2, hyperlipidemia.

## 2018-12-02 NOTE — Telephone Encounter (Signed)
I spoke with the pt and notified him of Laurens recommendation. The pt was displeased with the recommendations, stating that he doesn't understand what a primary provider is for if she's not going to refill his medication. I informed the patient that we do manage all of his medication that are prescribed by our office. I stress the important of him keeping his appt with his cardiologist so they can manage the medications they prescribe. He verbalize understanding, but declined to schedule a f/u appointment.

## 2018-12-16 ENCOUNTER — Ambulatory Visit (INDEPENDENT_AMBULATORY_CARE_PROVIDER_SITE_OTHER): Payer: BLUE CROSS/BLUE SHIELD | Admitting: Cardiovascular Disease

## 2018-12-16 ENCOUNTER — Encounter: Payer: Self-pay | Admitting: Cardiovascular Disease

## 2018-12-16 DIAGNOSIS — Z9884 Bariatric surgery status: Secondary | ICD-10-CM

## 2018-12-16 DIAGNOSIS — I10 Essential (primary) hypertension: Secondary | ICD-10-CM

## 2018-12-16 DIAGNOSIS — I5032 Chronic diastolic (congestive) heart failure: Secondary | ICD-10-CM

## 2018-12-16 MED ORDER — LISINOPRIL 40 MG PO TABS: 40.0000 mg | ORAL_TABLET | Freq: Every day | ORAL | 3 refills | Status: DC

## 2018-12-16 MED ORDER — METOPROLOL TARTRATE 100 MG PO TABS: 100.0000 mg | ORAL_TABLET | Freq: Two times a day (BID) | ORAL | 3 refills | Status: DC

## 2018-12-16 MED ORDER — POTASSIUM CHLORIDE CRYS ER 20 MEQ PO TBCR: 40.0000 meq | EXTENDED_RELEASE_TABLET | Freq: Two times a day (BID) | ORAL | 3 refills | Status: DC

## 2018-12-16 MED ORDER — FUROSEMIDE 40 MG PO TABS: 40.0000 mg | ORAL_TABLET | Freq: Every day | ORAL | 3 refills | Status: DC

## 2018-12-16 MED ORDER — RIVAROXABAN 20 MG PO TABS: 20.0000 mg | ORAL_TABLET | Freq: Every day | ORAL | 6 refills | Status: DC

## 2018-12-16 MED ORDER — DOXAZOSIN MESYLATE 8 MG PO TABS: 8.0000 mg | ORAL_TABLET | Freq: Every day | ORAL | 3 refills | Status: DC

## 2018-12-16 NOTE — Progress Notes (Signed)
Cardiology Office Note  Date:  12/16/2018   ID:  Marco James, DOB December 26, 1955, MRN [id number]  PCP:  Galen ManilaKennedy, Lauren Renee, NP   Chief Complaint  Patient presents with  . other    Chronic Afib.  Medications reviewed verbally.     HPI:  Marco James is a 62 year old gentleman with  morbid obesity, gastric bypass surgery 2018 hypertension,  diabetes type 2,  Prior smoking history, stopped 2016 chronic lower extremity swelling, previously on high-dose diuretics for symptomatic relief chronic atrial fibrillation dating back to before 2010 OSA on CPAP Chronic low potassium who follows up for his atrial fibrillation and lower extremity edema  Weight dropped down to 278 pounds in September now back up to 300 pounds Reports he has lost close to 100 pounds in total  Denies any symptoms of shortness of breath or chest pain No regular exercise program Chronic stable lower extremity edema Taking Lasix 40 daily with potassium 40 twice daily  Denies any recent problems with gout Previously seen by rheumatology  Hemoglobin A1c less than 5  Changing to [employer].  Our hospital system does not cover [facility] Reports he needs to [facility] Will be unable to see primary care until April  EKG personally reviewed by myself on todays visit Shows atrial fibrillation ventricular rate 84 bpm   PMH:   has a past medical history of A-fib (HCC), Arthritis, Cellulitis and abscess of leg (2009), CHF (congestive heart failure) (HCC), Dupuytren's disease, Dyspnea, Dysrhythmia, External hemorrhoids, Gout, HTN (hypertension), Hyperlipidemia, Hyperuricemia, Obstructive sleep apnea, Pneumonia, Polysubstance abuse (HCC), Toenail fungus, and Type 2 diabetes mellitus (HCC).  PSH:    Past Surgical History:  Procedure Laterality Date  . COLONOSCOPY WITH PROPOFOL N/A 10/25/2015   Procedure: COLONOSCOPY WITH PROPOFOL;  Surgeon: Midge Miniumarren Wohl, MD;  Location: [facility] ENDOSCOPY;  Service:  Endoscopy;  Laterality: N/A;  . LAPAROSCOPIC GASTRIC SLEEVE RESECTION N/A 11/18/2017   Procedure: LAPAROSCOPIC GASTRIC SLEEVE RESECTION WITH UPPER ENDOSCOPY;  Surgeon: Luretha MurphyMartin, Matthew, MD;  Location: [facility] ORS;  Service: General;  Laterality: N/A;  . LAPAROSCOPIC GASTRIC SLEEVE RESECTION     11/18/17 Dr. Daphine DeutscherMartin  . NASAL SINUS SURGERY    . WISDOM TOOTH EXTRACTION      Current Outpatient Medications  Medication Sig Dispense Refill  . doxazosin (CARDURA) 8 MG tablet Take 1 tablet (8 mg total) by mouth daily. 30 tablet 6  . furosemide (LASIX) 40 MG tablet Take 1 tablet (40 mg total) by mouth daily. 90 tablet 0  . lisinopril (PRINIVIL,ZESTRIL) 40 MG tablet TAKE 1 TABLET BY MOUTH ONCE DAILY 90 tablet 3  . metoprolol tartrate (LOPRESSOR) 100 MG tablet Take 1 tablet (100 mg total) by mouth 2 (two) times daily. 180 tablet 3  . potassium chloride SA (K-DUR,KLOR-CON) 20 MEQ tablet Take 2 tablets (40 mEq total) by mouth 2 (two) times daily. 360 tablet 0  . warfarin (COUMADIN) 1 MG tablet Take 1 mg tablet by mouth on Monday, Tuesday, Thursday, and Friday at 6 pm. (Patient taking differently: 1 mg. Take 1 mg tablet by mouth on Monday, Tuesday, Thursday, and Friday at 6 pm.) 20 tablet 1  . warfarin (COUMADIN) 6 MG tablet Take 1 tablet (6 mg total) by mouth daily at 6 PM. 60 tablet 0   No current facility-administered medications for this visit.      Allergies:   Patient has no known allergies.   Social History:  The patient  reports that he quit smoking  about 10 years ago. His smoking use included cigarettes. He has a 12.50 pack-year smoking history. He quit smokeless tobacco use about 27 years ago. He reports current alcohol use of about 16.0 standard drinks of alcohol per week. He reports current drug use. Drug: Marijuana.   Family History:   family history includes Heart disease (age of onset: 6477) in his father.    Review of Systems: Review of Systems  Constitutional: Positive for weight loss.   Cardiovascular: Negative.   Gastrointestinal: Negative.   Musculoskeletal: Negative.   Neurological: Negative.   Psychiatric/Behavioral: Negative.   All other systems reviewed and are negative.    PHYSICAL EXAM: VS:  BP (!) 144/88 (BP Location: Left Arm, Patient Position: Sitting, Cuff Size: Normal)   Pulse 84   Ht 6' (1.829 m)   Wt 300 lb (136.1 kg)   BMI 40.69 kg/m  , BMI Body mass index is 40.69 kg/m.  Constitutional:  oriented to person, place, and time. No distress.  Obese HENT:  Head: Grossly normal Eyes:  no discharge. No scleral icterus.  Neck: No JVD, no carotid bruits  Cardiovascular: Irregularly irregular no murmurs appreciated Trace nonpitting lower extremity edema, compression hose in place Pulmonary/Chest: Clear to auscultation bilaterally, no wheezes or rails Abdominal: Soft.  no distension.  no tenderness.  Musculoskeletal: Normal range of motion Neurological:  normal muscle tone. Coordination normal. No atrophy Skin: Skin warm and dry Psychiatric: normal affect, pleasant   Recent Labs: 08/26/2018: ALT 10; BUN 27; Creatinine, Ser 1.08; Hemoglobin 13.4; Platelets 152; Potassium 4.4; Sodium 141; TSH 2.730    Lipid Panel Lab Results  Component Value Date   CHOL 195 08/26/2018   HDL 51 08/26/2018   LDLCALC 127 (H) 08/26/2018   TRIG 83 08/26/2018      Wt Readings from Last 3 Encounters:  12/16/18 300 lb (136.1 kg)  10/15/18 288 lb (130.6 kg)  09/09/18 278 lb (126.1 kg)      ASSESSMENT AND PLAN:  Diastolic CHF, chronic (HCC) Recommend he stay on Lasix 40 daily with potassium 40 twice daily Chronically low potassium  Chronic atrial fibrillation (HCC) Rate well controlled, not a candidate to restore normal sinus rhythm Long discussion concerning risk and benefit of warfarin versus a NOAC He is willing to try Xarelto 20 daily.  For his once a day versus Eliquis  Morbid obesity (HCC) Dramatic improvement after gastric bypass Recommended low  carbohydrate diet  Essential (primary) hypertension Reports that he takes all of his blood pressure pills in the morning as he does not like to take these twice a day Taking Cardura 2 of the 8 mg pills Denies any orthostasis Blood pressure low today we will decrease Cardura down to 8 mg every morning Would likely need additional changes as he continues to lose weight  Pure hypercholesterolemia Currently not on a statin No known coronary disease  Bilateral lower extremity edema Stable chronic leg edema,  Continue compression hose Continue Lasix 40 daily with potassium  Type 2 diabetes mellitus with diabetic neuropathy, unspecified long term insulin use status (HCC) Hemoglobin A1c less than 5 after weight loss  CKD (chronic kidney disease) stage 3, GFR 30-59 ml/min Renal function dramatically improved with lower dose diuretic, creatinine 1.08 BUN 27   Total encounter time more than 25 minutes  Greater than 50% was spent in counseling and coordination of care with the patient   Disposition:   F/U as needed Reports [facility] given [facility]  No orders of the defined types were placed in this encounter.    Signed, Dossie Arbour, M.D., Ph.D. 12/16/2018  [facility] [place], [place] [phone]

## 2018-12-16 NOTE — Patient Instructions (Addendum)
Medication Instructions:   Please fill the xarelto 20 mg daily Do not start until talking with mandy end of Dec Stay on warfarin until you talk with Angelica ChessmanMandy   If you need a refill on your cardiac medications before your next appointment, please call your pharmacy.    Lab work: No new labs needed   If you have labs (blood work) drawn today and your tests are completely normal, you will receive your results only by: Marland Kitchen. MyChart Message (if you have MyChart) OR . A paper copy in the mail If you have any lab test that is abnormal or we need to change your treatment, we will call you to review the results.   Testing/Procedures: No new testing needed   Follow-Up: At [facility], you and your health needs are our priority.  As part of our continuing mission to provide you with exceptional heart care, we have created designated Provider Care Teams.  These Care Teams include your primary Cardiologist (physician) and Advanced Practice Providers (APPs -  Physician Assistants and Nurse Practitioners) who all work together to provide you with the care you need, when you need it.  . You will need a follow up appointment as needed  . Providers on your designated Care Team:   . Nicolasa Duckinghristopher Berge, NP . Eula Listenyan Dunn, PA-C . Marisue IvanJacquelyn Visser, PA-C  Any Other Special Instructions Will Be Listed Below (If Applicable).  For educational health videos Log in to : [email or web address] Or : [email or web address], password : [email or web address]

## 2018-12-22 ENCOUNTER — Ambulatory Visit (INDEPENDENT_AMBULATORY_CARE_PROVIDER_SITE_OTHER): Payer: BLUE CROSS/BLUE SHIELD

## 2018-12-22 ENCOUNTER — Telehealth: Payer: Self-pay

## 2018-12-22 DIAGNOSIS — Z9884 Bariatric surgery status: Secondary | ICD-10-CM | POA: Diagnosis not present

## 2018-12-22 DIAGNOSIS — I482 Chronic atrial fibrillation, unspecified: Secondary | ICD-10-CM

## 2018-12-22 DIAGNOSIS — Z5181 Encounter for therapeutic drug level monitoring: Secondary | ICD-10-CM

## 2018-12-22 LAB — POCT INR: INR: 2 (ref 2.0–3.0)

## 2018-12-22 NOTE — Telephone Encounter (Signed)
Pt came in for INR check today and states that he had ov w/ Dr. Mariah MillingGollan on 12/24 and requested 90 supply of his meds be sent in, as he is unsure if his insurance will let him continue to be seen in this office.   He states that doxazosin 8 mg was sent in, but only 90 tablets.   He requests that I resend in 180 tablets, as he takes 2 per day.   Advised pt that at his ov on 12/24:  "Reports that he takes all of his blood pressure pills in the morning as he does not like to take these twice a day Taking Cardura 2 of the 8 mg pills Denies any orthostasis Blood pressure low today we will decrease Cardura down to 8 mg every morning Would likely need additional changes as he continues to lose weight"  Pt states that his BP was 144/88 at that ov and he does not feel that this was low enough to warrant a change in his meds. Advised him that I will make Dr. Mariah MillingGollan aware of his concerns and have his nurse call him back w/ his recommendation.

## 2018-12-22 NOTE — Patient Instructions (Signed)
STOP COUMADIN - START XARELTO 20 MG TOMORROW MORNING.

## 2018-12-24 NOTE — Telephone Encounter (Signed)
If he was taking Cardura 8 mg  two tablets a day in the past we can continue this but would prefer he try to take it a.m. and p.m. Significant dose to take 16 mg at one time, can contribute to labile blood pressure very low than higher later in the day

## 2018-12-26 MED ORDER — DOXAZOSIN MESYLATE 8 MG PO TABS: 16.0000 mg | ORAL_TABLET | Freq: Every day | ORAL | 3 refills | Status: AC

## 2018-12-26 NOTE — Telephone Encounter (Signed)
Spoke with patient and reviewed provider recommendations and timing of medication. He states that he has been taking two tablets once daily for a long time and has not had any fluctuations in blood pressures. Reviewed that if he monitors BP and notices a increase in the evening then he should try taking it twice a day to see if that will help. He states that since his weight loss everything has been going well. Instructed him to monitor blood pressures and to please give [place] a call if he has any concerns. He requested new script to reflect the 2 tablets once daily like he is taking it and sent that in.   He also wanted to review EKG tracing given that it had the word "block" and he did not know he had a "blockage". Reviewed that provider did review and noted that he was in afib with ventricular rate of 84 and that it does not indicate an actual heart blockage. Had lengthy conversation about this and how the machine can sometimes read it differently based on different factors. He verbalized understanding and had no further questions.

## 2019-02-17 NOTE — Telephone Encounter (Signed)
Pt established with Wilhelmina Mcardle NP on 08/21/18.

## 2019-03-17 IMAGING — DX DG WRIST 2V*R*
2 series · 2 of 2 positions shown · non-contrast
Comparison: No priors.

CLINICAL DATA: 60-year-old male with history of synovitis in the
wrist.

EXAM:
RIGHT WRIST - 2 VIEW

[wrist ap]
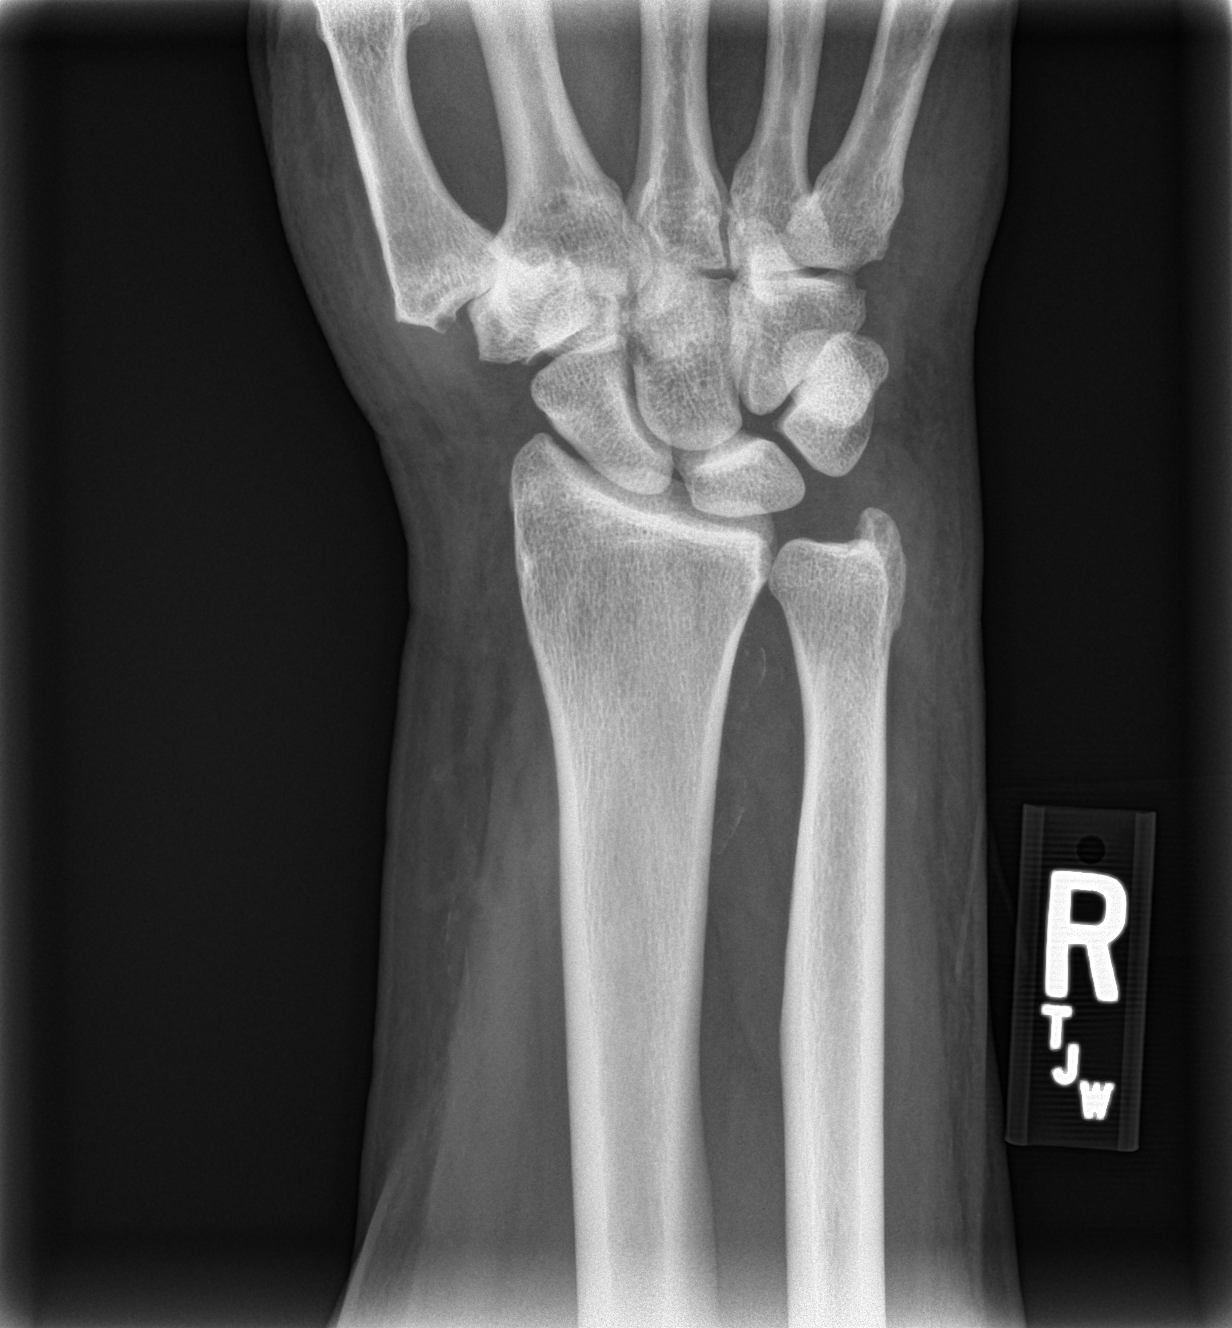

[wrist lat]
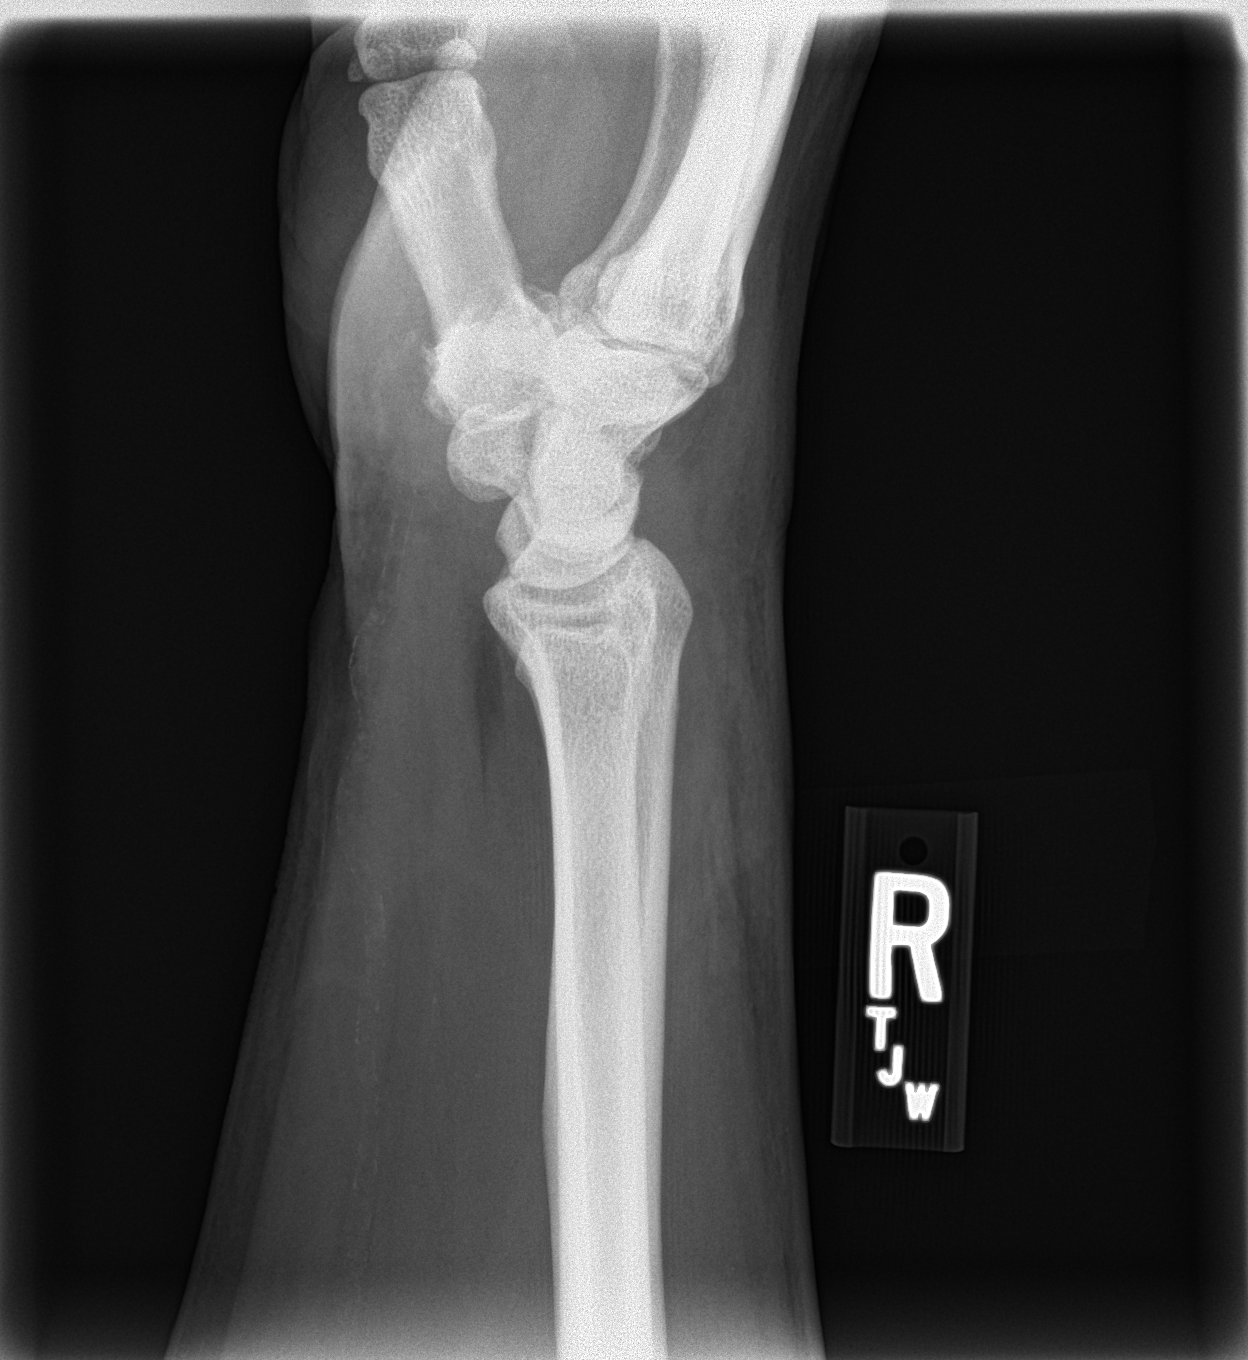

[2 of 2 positions shown; findings below may reference images not displayed]

FINDINGS: There is no evidence of fracture or dislocation. Scattered regions
of joint space narrowing, subchondral sclerosis, osteophyte
formation and subchondral cyst formation, most evident at the first
CMC joint, compatible with degenerative changes of osteoarthritis.
There is no other focal bone abnormality. Soft tissues are
unremarkable.
IMPRESSION: 1. No acute radiographic abnormality of the right wrist.
2. Mild degenerative changes of osteoarthritis, as above.

## 2019-04-07 ENCOUNTER — Telehealth: Payer: Self-pay | Admitting: Cardiovascular Disease

## 2019-04-07 NOTE — Telephone Encounter (Signed)
Attempted to schedule fu from recall.  Patient is in [facility] network and will be continuing care there against his preference.  Patient declines appt.s .  Deleting Recall.

## 2019-04-21 ENCOUNTER — Ambulatory Visit: Payer: BLUE CROSS/BLUE SHIELD | Admitting: Skilled Nursing Facility1

## 2019-07-24 ENCOUNTER — Other Ambulatory Visit: Payer: Self-pay | Admitting: Cardiovascular Disease

## 2019-07-24 NOTE — Telephone Encounter (Signed)
Please review for refill. Thank you! 

## 2019-07-24 NOTE — Telephone Encounter (Signed)
Age 63, weight 136kg, SCr 1.08 on 08/26/18, CrCl > 100. Afib indication, last OV Dec 2019

## 2019-12-22 ENCOUNTER — Other Ambulatory Visit: Payer: Self-pay | Admitting: Cardiovascular Disease

## 2019-12-22 DIAGNOSIS — I5032 Chronic diastolic (congestive) heart failure: Secondary | ICD-10-CM

## 2019-12-22 DIAGNOSIS — Z9884 Bariatric surgery status: Secondary | ICD-10-CM

## 2020-01-18 ENCOUNTER — Other Ambulatory Visit: Payer: Self-pay

## 2020-01-18 DIAGNOSIS — I5032 Chronic diastolic (congestive) heart failure: Secondary | ICD-10-CM

## 2020-01-18 DIAGNOSIS — Z9884 Bariatric surgery status: Secondary | ICD-10-CM

## 2020-01-18 MED ORDER — LISINOPRIL 40 MG PO TABS: 40.0000 mg | ORAL_TABLET | Freq: Every day | ORAL | 0 refills | Status: AC

## 2020-01-18 MED ORDER — POTASSIUM CHLORIDE CRYS ER 20 MEQ PO TBCR: 40.0000 meq | EXTENDED_RELEASE_TABLET | Freq: Two times a day (BID) | ORAL | 0 refills | Status: DC

## 2020-06-16 ENCOUNTER — Encounter (HOSPITAL_COMMUNITY): Payer: Self-pay

## 2021-06-13 ENCOUNTER — Encounter (HOSPITAL_COMMUNITY): Payer: Self-pay | Admitting: *Deleted

## 2021-06-16 ENCOUNTER — Encounter: Payer: Self-pay | Admitting: *Deleted

## 2021-06-16 ENCOUNTER — Encounter (HOSPITAL_COMMUNITY): Payer: Self-pay | Admitting: *Deleted

## 2022-06-13 ENCOUNTER — Encounter (HOSPITAL_COMMUNITY): Payer: Self-pay | Admitting: *Deleted

## 2024-01-01 ENCOUNTER — Encounter: Payer: Self-pay | Admitting: Nurse Practitioner

## 2024-01-06 ENCOUNTER — Telehealth: Payer: Self-pay

## 2024-01-06 MED ORDER — NA SULFATE-K SULFATE-MG SULF 17.5-3.13-1.6 GM/177ML PO SOLN: 1.0000 | Freq: Once | ORAL | 0 refills | Status: DC

## 2024-01-06 NOTE — Telephone Encounter (Signed)
 Colonoscopy referral received for patient.  During triage colonoscopy dated 10/25/2015 reviewed.  Dr. Servando Snare recommended repeat in 10 years.  Pt has been advised his colonoscopy will not be due until 10/24/2025.  Thanks,  Marcelino Duster CMA

## 2024-03-11 ENCOUNTER — Other Ambulatory Visit: Payer: Self-pay | Admitting: Nurse Practitioner

## 2024-03-11 DIAGNOSIS — F17200 Nicotine dependence, unspecified, uncomplicated: Secondary | ICD-10-CM

## 2024-06-15 ENCOUNTER — Emergency Department (HOSPITAL_COMMUNITY)

## 2024-06-15 ENCOUNTER — Other Ambulatory Visit: Payer: Self-pay

## 2024-06-15 ENCOUNTER — Inpatient Hospital Stay (HOSPITAL_COMMUNITY)
Admission: EM | Admit: 2024-06-15 | Discharge: 2024-06-19 | DRG: 277 | Disposition: A | Discharge status: Home / Self Care | Attending: Internal Medicine | Admitting: Internal Medicine

## 2024-06-15 ENCOUNTER — Encounter (HOSPITAL_COMMUNITY): Payer: Self-pay

## 2024-06-15 DIAGNOSIS — I442 Atrioventricular block, complete: Principal | ICD-10-CM | POA: Diagnosis present

## 2024-06-15 DIAGNOSIS — D6869 Other thrombophilia: Secondary | ICD-10-CM | POA: Diagnosis present

## 2024-06-15 DIAGNOSIS — Z6841 Body Mass Index (BMI) 40.0 and over, adult: Secondary | ICD-10-CM

## 2024-06-15 DIAGNOSIS — I452 Bifascicular block: Secondary | ICD-10-CM | POA: Diagnosis present

## 2024-06-15 DIAGNOSIS — R634 Abnormal weight loss: Secondary | ICD-10-CM | POA: Diagnosis present

## 2024-06-15 DIAGNOSIS — Z7901 Long term (current) use of anticoagulants: Secondary | ICD-10-CM

## 2024-06-15 DIAGNOSIS — M109 Gout, unspecified: Secondary | ICD-10-CM | POA: Diagnosis present

## 2024-06-15 DIAGNOSIS — R001 Bradycardia, unspecified: Secondary | ICD-10-CM | POA: Diagnosis not present

## 2024-06-15 DIAGNOSIS — Y92239 Unspecified place in hospital as the place of occurrence of the external cause: Secondary | ICD-10-CM | POA: Diagnosis present

## 2024-06-15 DIAGNOSIS — Z79899 Other long term (current) drug therapy: Secondary | ICD-10-CM

## 2024-06-15 DIAGNOSIS — I4821 Permanent atrial fibrillation: Secondary | ICD-10-CM | POA: Diagnosis present

## 2024-06-15 DIAGNOSIS — N179 Acute kidney failure, unspecified: Secondary | ICD-10-CM | POA: Diagnosis present

## 2024-06-15 DIAGNOSIS — Z8249 Family history of ischemic heart disease and other diseases of the circulatory system: Secondary | ICD-10-CM

## 2024-06-15 DIAGNOSIS — I5A Non-ischemic myocardial injury (non-traumatic): Secondary | ICD-10-CM | POA: Diagnosis present

## 2024-06-15 DIAGNOSIS — Z9884 Bariatric surgery status: Secondary | ICD-10-CM

## 2024-06-15 DIAGNOSIS — I110 Hypertensive heart disease with heart failure: Secondary | ICD-10-CM | POA: Diagnosis present

## 2024-06-15 DIAGNOSIS — T502X5A Adverse effect of carbonic-anhydrase inhibitors, benzothiadiazides and other diuretics, initial encounter: Secondary | ICD-10-CM | POA: Diagnosis present

## 2024-06-15 DIAGNOSIS — D696 Thrombocytopenia, unspecified: Secondary | ICD-10-CM | POA: Diagnosis present

## 2024-06-15 DIAGNOSIS — D8685 Sarcoid myocarditis: Secondary | ICD-10-CM | POA: Diagnosis present

## 2024-06-15 DIAGNOSIS — G4733 Obstructive sleep apnea (adult) (pediatric): Secondary | ICD-10-CM | POA: Diagnosis present

## 2024-06-15 DIAGNOSIS — Z87891 Personal history of nicotine dependence: Secondary | ICD-10-CM

## 2024-06-15 DIAGNOSIS — E66813 Obesity, class 3: Secondary | ICD-10-CM | POA: Diagnosis present

## 2024-06-15 DIAGNOSIS — I5042 Chronic combined systolic (congestive) and diastolic (congestive) heart failure: Secondary | ICD-10-CM | POA: Diagnosis present

## 2024-06-15 DIAGNOSIS — I5032 Chronic diastolic (congestive) heart failure: Secondary | ICD-10-CM | POA: Diagnosis present

## 2024-06-15 DIAGNOSIS — E119 Type 2 diabetes mellitus without complications: Secondary | ICD-10-CM | POA: Diagnosis not present

## 2024-06-15 DIAGNOSIS — R7989 Other specified abnormal findings of blood chemistry: Secondary | ICD-10-CM | POA: Diagnosis not present

## 2024-06-15 DIAGNOSIS — I4891 Unspecified atrial fibrillation: Secondary | ICD-10-CM | POA: Diagnosis present

## 2024-06-15 DIAGNOSIS — R600 Localized edema: Secondary | ICD-10-CM

## 2024-06-15 DIAGNOSIS — K5900 Constipation, unspecified: Secondary | ICD-10-CM | POA: Diagnosis not present

## 2024-06-15 DIAGNOSIS — Z7985 Long-term (current) use of injectable non-insulin antidiabetic drugs: Secondary | ICD-10-CM

## 2024-06-15 DIAGNOSIS — I10 Essential (primary) hypertension: Secondary | ICD-10-CM | POA: Diagnosis present

## 2024-06-15 DIAGNOSIS — E785 Hyperlipidemia, unspecified: Secondary | ICD-10-CM | POA: Diagnosis present

## 2024-06-15 DIAGNOSIS — Z5986 Financial insecurity: Secondary | ICD-10-CM

## 2024-06-15 LAB — BASIC METABOLIC PANEL WITH GFR
Anion gap: 13 (ref 5–15)
BUN: 41 mg/dL — ABNORMAL HIGH (ref 8–23)
CO2: 22 mmol/L (ref 22–32)
Calcium: 8.7 mg/dL — ABNORMAL LOW (ref 8.9–10.3)
Chloride: 100 mmol/L (ref 98–111)
Creatinine, Ser: 1.73 mg/dL — ABNORMAL HIGH (ref 0.61–1.24)
GFR, Estimated: 43 mL/min — ABNORMAL LOW (ref >60)
Glucose, Bld: 82 mg/dL (ref 70–99)
Potassium: 4.2 mmol/L (ref 3.5–5.1)
Sodium: 135 mmol/L (ref 135–145)

## 2024-06-15 LAB — CBC
HCT: 47 % (ref 39.0–52.0)
Hemoglobin: 15.8 g/dL (ref 13.0–17.0)
MCH: 31.8 pg (ref 26.0–34.0)
MCHC: 33.6 g/dL (ref 30.0–36.0)
MCV: 94.6 fL (ref 80.0–100.0)
Platelets: 95 10*3/uL — ABNORMAL LOW (ref 150–400)
RBC: 4.97 MIL/uL (ref 4.22–5.81)
RDW: 12.7 % (ref 11.5–15.5)
WBC: 7 10*3/uL (ref 4.0–10.5)
nRBC: 0 % (ref 0.0–0.2)

## 2024-06-15 LAB — TROPONIN I (HIGH SENSITIVITY)
Troponin I (High Sensitivity): 27 ng/L — ABNORMAL HIGH (ref <18)
Troponin I (High Sensitivity): 29 ng/L — ABNORMAL HIGH (ref <18)

## 2024-06-15 LAB — BRAIN NATRIURETIC PEPTIDE: B Natriuretic Peptide: 124.5 pg/mL — ABNORMAL HIGH (ref 0.0–100.0)

## 2024-06-15 LAB — MAGNESIUM: Magnesium: 2.2 mg/dL (ref 1.7–2.4)

## 2024-06-15 NOTE — ED Triage Notes (Signed)
 Pt sent by PCP for evaluation of bradycardia that's been going on for about 6 weeks, pt waiting to see a cardiologist in July. Pt has no complaints.

## 2024-06-15 NOTE — H&P (Incomplete)
 History and Physical    Marco James [facility] FMW:[id number] DOB: 15-Oct-1956 DOA: 06/15/2024  Patient coming from: Home.  Chief Complaint: Slow heart rate.  HPI: Marco James is a 68 y.o. male with history of atrial fibrillation, CHF, sleep apnea, diabetes mellitus type 2, hypertension, gout has been recently treated for CHF exacerbation with increasing dose of torsemide.  Patient also noticed that his heart rate has been running in the lower 40s.  Patient states he has lost at least 27 pounds in the last few weeks from diuresis.  Feels fatigued and weak.  Denies any chest pain.  Due to persistent bradycardia patient presents to the ER.  In April 2020 patient was found to be bradycardic with slow A-fib and was admitted to the hospital in [facility]  at that time patient's metoprolol  was discontinued and following which heart rate improved.  ED Course: In the ER patient is found to be in A-fib with slow ventricular response.  Troponins were 27 and 29.  BNP 124.  Platelets 95.  Creatinine 1.7.  Cardiology on-call was consulted patient admitted for further observation.  Review of Systems: As per HPI, rest all negative.   Past Medical History:  Diagnosis Date   A-fib [facility])    Arthritis    Cellulitis and abscess of leg 2009   CHF (congestive heart failure) (HCC)    Dupuytren's disease    Dyspnea    at times   Dysrhythmia    External hemorrhoids    Gout    HTN (hypertension)    Hyperlipidemia    Hyperuricemia    Obstructive sleep apnea    cpap   Pneumonia    Polysubstance abuse (HCC)    Toenail fungus    Type 2 diabetes mellitus (HCC)    type 2, diet controlled after gastric band surgery    Past Surgical History:  Procedure Laterality Date   COLONOSCOPY WITH PROPOFOL  N/A 10/25/2015   Procedure: COLONOSCOPY WITH PROPOFOL ;  Surgeon: Rogelia Copping, MD;  Location: [facility] ENDOSCOPY;  Service: Endoscopy;  Laterality: N/A;   LAPAROSCOPIC GASTRIC SLEEVE RESECTION N/A 11/18/2017    Procedure: LAPAROSCOPIC GASTRIC SLEEVE RESECTION WITH UPPER ENDOSCOPY;  Surgeon: Gladis Cough, MD;  Location: [facility] ORS;  Service: General;  Laterality: N/A;   LAPAROSCOPIC GASTRIC SLEEVE RESECTION     11/18/17 Dr. Gladis   NASAL SINUS SURGERY     WISDOM TOOTH EXTRACTION       reports that he quit smoking about 15 years ago. His smoking use included cigarettes. He started smoking about 40 years ago. He has a 12.5 pack-year smoking history. He quit smokeless tobacco use about 32 years ago. He reports current alcohol use of about 16.0 standard drinks of alcohol per week. He reports current drug use. Drug: Marijuana.  No Known Allergies  Family History  Problem Relation Age of Onset   Heart disease Father 9    Prior to Admission medications   Medication Sig Start Date End Date Taking? Authorizing Provider  allopurinol  (ZYLOPRIM ) 300 MG tablet Take 300 mg by mouth daily.   Yes [provider]  amLODipine  (NORVASC ) 10 MG tablet Take 10 mg by mouth daily.   Yes [provider]  colchicine 0.6 MG tablet Take 0.6-1.2 mg by mouth daily. 05/13/24  Yes [provider]  doxazosin  (CARDURA ) 8 MG tablet Take 2 tablets (16 mg total) by mouth daily. Patient taking differently: Take 8 mg by mouth daily. 12/26/18  Yes Gollan, Timothy J, MD  lisinopril  (ZESTRIL )  40 MG tablet Take 1 tablet (40 mg total) by mouth daily. Please call to schedule office visit for further refills. Thank you! 01/18/20  Yes Gollan, Timothy J, MD  potassium chloride  SA (KLOR-CON ) 20 MEQ tablet Take 2 tablets (40 mEq total) by mouth 2 (two) times daily. Please call to schedule appointment for further refills. Thank you! Patient taking differently: Take 40 mEq by mouth daily. Please call to schedule appointment for further refills. Thank you! 01/18/20  Yes Gollan, Timothy J, MD  tirzepatide [facility]) 2.5 MG/0.5ML Pen Inject 2.5 mg into the skin once a week. Thursday   Yes [provider]  torsemide  (DEMADEX) 20 MG tablet Take 60 mg by mouth daily. 05/28/24  Yes [provider]  XARELTO  20 MG TABS tablet TAKE 1 TABLET(20 MG) BY MOUTH DAILY WITH SUPPER 07/24/19  Yes Gollan, Timothy J, MD  furosemide  (LASIX ) 40 MG tablet Take 1 tablet (40 mg total) by mouth daily. Patient not taking: Reported on 06/15/2024 12/16/18   Gollan, Timothy J, MD    Physical Exam: Constitutional: Moderately built and nourished. Vitals:   06/15/24 1930 06/15/24 1945 06/15/24 2000 06/15/24 2116  BP: (!) 118/55 (!) 125/59 118/61   Pulse: (!) 38 (!) 36 (!) 36   Resp: 12 13 15    Temp:    98 F (36.7 C)  TempSrc:    Oral  SpO2: 100% 100% 99%   Weight:      Height:       Eyes: Anicteric no pallor. ENMT: No discharge from the ears eyes nose and mouth. Neck: No mass felt.  No neck rigidity. Respiratory: No rhonchi or crepitations. Cardiovascular: S1-S2 heard. Abdomen: Soft nontender bowel sound present. Musculoskeletal: Mild edema of the both lower extremity. Skin: No rash. Neurologic: Alert oriented time place and person.  Moves all extremities. Psychiatric: Appears normal.  Normal affect.   Labs on Admission: I have personally reviewed following labs and imaging studies  CBC: Recent Labs  Lab 06/15/24 1906  WBC 7.0  HGB 15.8  HCT 47.0  MCV 94.6  PLT 95*   Basic Metabolic Panel: Recent Labs  Lab 06/15/24 1853 06/15/24 1906  NA  --  135  K  --  4.2  CL  --  100  CO2  --  22  GLUCOSE  --  82  BUN  --  41*  CREATININE  --  1.73*  CALCIUM  --  8.7*  MG 2.2  --    GFR: Estimated Creatinine Clearance: 62.7 mL/min (A) (by C-G formula based on SCr of 1.73 mg/dL (H)). Liver Function Tests: No results for input(s): AST, ALT, ALKPHOS, BILITOT, PROT, ALBUMIN  in the last 168 hours. No results for input(s): LIPASE, AMYLASE in the last 168 hours. No results for input(s): AMMONIA in the last 168 hours. Coagulation Profile: No results for input(s): INR, PROTIME in the  last 168 hours. Cardiac Enzymes: No results for input(s): CKTOTAL, CKMB, CKMBINDEX, TROPONINI in the last 168 hours. BNP (last 3 results) No results for input(s): PROBNP in the last 8760 hours. HbA1C: No results for input(s): HGBA1C in the last 72 hours. CBG: No results for input(s): GLUCAP in the last 168 hours. Lipid Profile: No results for input(s): CHOL, HDL, LDLCALC, TRIG, CHOLHDL, LDLDIRECT in the last 72 hours. Thyroid Function Tests: No results for input(s): TSH, T4TOTAL, FREET4, T3FREE, THYROIDAB in the last 72 hours. Anemia Panel: No results for input(s): VITAMINB12, FOLATE, FERRITIN, TIBC, IRON, RETICCTPCT in the last 72 hours. Urine analysis:  Component Value Date/Time   BILIRUBINUR negative 09/09/2018 1219   PROTEINUR Negative 09/09/2018 1219   UROBILINOGEN 0.2 09/09/2018 1219   NITRITE negative 09/09/2018 1219   LEUKOCYTESUR Large (3+) (A) 09/09/2018 1219   Sepsis Labs: @LABRCNTIP (procalcitonin:4,lacticidven:4) )No results found for this or any previous visit (from the past 240 hours).   Radiological Exams on Admission: DG Chest 1 View Result Date: 06/15/2024 CLINICAL DATA:  Chest pain and bradycardia EXAM: CHEST  1 VIEW COMPARISON:  Chest radiograph dated 01/03/2017 FINDINGS: Normal lung volumes. No focal consolidations. No pleural effusion or pneumothorax. Similar enlarged cardiomediastinal silhouette. No acute osseous abnormality. IMPRESSION: 1.  No focal consolidations. 2. Similar cardiomegaly. Electronically Signed   By: Limin  Xu M.D.   On: 06/15/2024 19:48    EKG: Independently reviewed.  A-fib with slow ventricular response.  Assessment/Plan Principal Problem:   Symptomatic bradycardia Active Problems:   Diastolic CHF, chronic (HCC)   A-fib (HCC)   Diabetes mellitus type 2, diet-controlled (HCC)   Essential (primary) hypertension    Symptomatic bradycardia with atrial fibrillation with slow  ventricular response and bifascicular block.  Appreciate cardiology consult.  Patient presently not on any nodal agents.  Cardiology considering pacemaker placement.  Holding Xarelto  in anticipation of procedure. History of diastolic CHF last EF measured was in July 2022 which showed EF of 55 to 60% with grade 2 diastolic dysfunction.  Has had recent aggressive diuresis with hold diuretics for now as recommended by cardiologist.  Check 2D echo. Elevated troponin has remained flat.  Likely will need ischemic workup.  Check 2D echo.  Denies any chest pain. Hypertension on amlodipine  Cardura .  Hold lisinopril  due to elevated creatinine. Diabetes mellitus type 2 recently was started on Mounjaro.  Hemoglobin A1c is 5.6.  On sliding scale coverage. Sleep apnea on CPAP at bedtime. Acute renal failure with creatinine 1.7.  Last creatinine in Care Everywhere on August 2023 was 0.6.  Likely increased from recent aggressive diuresis.  Hold lisinopril .  Follow metabolic panel. Gout on allopurinol . Thrombocytopenia appears to be chronic.  Since patient has symptomatic bradycardia will need further workup and more than 2 midnight stay.   DVT prophylaxis: Lovenox . Code Status: Full code. Family Communication: Patient's wife. Disposition Plan: Progressive care. Consults called: Cardiology. Admission status: Observation.

## 2024-06-15 NOTE — Consult Note (Signed)
 Cardiology Consultation   Patient ID: Marco James [facility] MRN: [id number]; DOB: 06/03/1956  Admit date: 06/15/2024 Date of Consult: 06/15/2024  PCP:  Derenda Rockers, MD   [facility] Providers Cardiologist:  None        Patient Profile: Marco James is a 68 y.o. male with a hx of HTN, HLD, severe obesity, type 2 diabetes, HFpEF, permanent A-fib who is being seen 06/15/2024 for the evaluation of bradycardia at the request of emergency department.  History of Present Illness: Mr. Kipp presents with subacute on chronic worsening dyspnea on exertion, volume overload, and fatigue really since January of this year.  States that he has had some insurance changes and has been in between different providers in the [facility] system and now here where they have been escalating on doses of diuretics for which now he is on torsemide and as needed metolazone  with improvement in his volume status of 27 pounds over the course of the few weeks.  During that time he states that he was seen by his PCP and had heart rates in the 40s but nothing was done and then today represented for persistent worsening symptoms of fatigue, dyspnea and was found to have A-fib with slow ventricular response in the 30s to 40s so was sent to the ED.  He had 1 episode back in 2019 of bradycardia for which he was on metoprolol  and this was discontinued.  Since that time and per chart review his EKG demonstrates heart rates in the 80s.  He thinks that for at least the last month and even possibly since January his heart rate has been slow but he is on sure on exact time frames.  He denies any chest pain or syncope.  He does take Xarelto  and has not missed any doses.  Never had an ischemic workup in the past.  Most recent echo from 2022 preserved EF but diastolic grade 2 dysfunction.  In the ED he was found to be in permanent A-fib with slow ventricular response with heart rates in the 30s to 40s.  Hemodynamically stable.  Labs  notable for creatinine 1.7 (previous 1.1), BNP 124, troponin 27.  Denies any acute symptoms.  Past Medical History:  Diagnosis Date   A-fib [facility])    Arthritis    Cellulitis and abscess of leg 2009   CHF (congestive heart failure) (HCC)    Dupuytren's disease    Dyspnea    at times   Dysrhythmia    External hemorrhoids    Gout    HTN (hypertension)    Hyperlipidemia    Hyperuricemia    Obstructive sleep apnea    cpap   Pneumonia    Polysubstance abuse (HCC)    Toenail fungus    Type 2 diabetes mellitus (HCC)    type 2, diet controlled after gastric band surgery    Past Surgical History:  Procedure Laterality Date   COLONOSCOPY WITH PROPOFOL  N/A 10/25/2015   Procedure: COLONOSCOPY WITH PROPOFOL ;  Surgeon: Rogelia Copping, MD;  Location: [facility] ENDOSCOPY;  Service: Endoscopy;  Laterality: N/A;   LAPAROSCOPIC GASTRIC SLEEVE RESECTION N/A 11/18/2017   Procedure: LAPAROSCOPIC GASTRIC SLEEVE RESECTION WITH UPPER ENDOSCOPY;  Surgeon: Gladis Cough, MD;  Location: [facility] ORS;  Service: General;  Laterality: N/A;   LAPAROSCOPIC GASTRIC SLEEVE RESECTION     11/18/17 Dr. Gladis   NASAL SINUS SURGERY     WISDOM TOOTH EXTRACTION       Home Medications:  Prior to Admission medications  Medication Sig Start Date End Date Taking? Authorizing Provider  doxazosin  (CARDURA ) 8 MG tablet Take 2 tablets (16 mg total) by mouth daily. 12/26/18   Gollan, Timothy J, MD  furosemide  (LASIX ) 40 MG tablet Take 1 tablet (40 mg total) by mouth daily. 12/16/18   Gollan, Timothy J, MD  lisinopril  (ZESTRIL ) 40 MG tablet Take 1 tablet (40 mg total) by mouth daily. Please call to schedule office visit for further refills. Thank you! 01/18/20   Gollan, Timothy J, MD  metoprolol  tartrate (LOPRESSOR ) 100 MG tablet Take 1 tablet (100 mg total) by mouth 2 (two) times daily. 12/16/18   Gollan, Timothy J, MD  potassium chloride  SA (KLOR-CON ) 20 MEQ tablet Take 2 tablets (40 mEq total) by mouth 2 (two) times daily. Please call  to schedule appointment for further refills. Thank you! 01/18/20   Gollan, Timothy J, MD  XARELTO  20 MG TABS tablet TAKE 1 TABLET(20 MG) BY MOUTH DAILY WITH SUPPER 07/24/19   Gollan, Timothy J, MD    Scheduled Meds:  Continuous Infusions:  PRN Meds:   Allergies:   No Known Allergies  Social History:   Social History   Socioeconomic History   Marital status: Divorced    Spouse name: Not on file   Number of children: 1   Years of education: Not on file   Highest education level: Associate degree: occupational, Scientist, product/process development, or vocational program  Occupational History   Occupation: Toolmaker    Comment: self employed  Tobacco Use   Smoking status: Former    Current packs/day: 0.00    Average packs/day: 0.5 packs/day for 25.0 years (12.5 ttl pk-yrs)    Types: Cigarettes    Start date: 09/10/1983    Quit date: 09/09/2008    Years since quitting: 15.7   Smokeless tobacco: Former    Quit date: 11/08/1991  Vaping Use   Vaping status: Never Used  Substance and Sexual Activity   Alcohol use: Yes    Alcohol/week: 16.0 standard drinks of alcohol    Types: 16 Shots of liquor per week   Drug use: Yes    Types: Marijuana    Comment: Pt. used cocaine for 3 years, marijuana--long ago   Sexual activity: Not Currently  Other Topics Concern   Not on file  Social History Narrative   1 son in [place]   Social Drivers of Health   Financial Resource Strain: High Risk (06/11/2022)   Received from [facility]   Overall Financial Resource Strain (CARDIA)    Difficulty of Paying Living Expenses: Hard  Food Insecurity: No Food Insecurity (06/11/2022)   Received from [facility]   Hunger Vital Sign    Within the past 12 months, you worried that your food would run out before you got the money to buy more.: Never true    Within the past 12 months, the food you bought just didn't last and you didn't have money to get more.: Never true  Transportation Needs: No Transportation Needs  (06/11/2022)   Received from [facility]   PRAPARE - Transportation    Lack of Transportation (Medical): No    Lack of Transportation (Non-Medical): No  Physical Activity: Inactive (08/21/2018)   Exercise Vital Sign    Days of Exercise per Week: 0 days    Minutes of Exercise per Session: 0 min  Stress: Not on file  Social Connections: Not on file  Intimate Partner Violence: Not At Risk (06/11/2022)   Received from [facility]  Humiliation, Afraid, Rape, and Kick questionnaire    Within the last year, have you been afraid of your partner or ex-partner?: No    Within the last year, have you been humiliated or emotionally abused in other ways by your partner or ex-partner?: No    Within the last year, have you been kicked, hit, slapped, or otherwise physically hurt by your partner or ex-partner?: No    Within the last year, have you been raped or forced to have any kind of sexual activity by your partner or ex-partner?: No    Family History:    Family History  Problem Relation Age of Onset   Heart disease Father 60     ROS:  Please see the history of present illness.   All other ROS reviewed and negative.     Physical Exam/Data: Vitals:   06/15/24 1930 06/15/24 1945 06/15/24 2000 06/15/24 2116  BP: (!) 118/55 (!) 125/59 118/61   Pulse: (!) 38 (!) 36 (!) 36   Resp: 12 13 15    Temp:    98 F (36.7 C)  TempSrc:    Oral  SpO2: 100% 100% 99%   Weight:      Height:       No intake or output data in the 24 hours ending 06/15/24 2206    06/15/2024    5:22 PM 12/16/2018   10:27 AM 10/15/2018    4:49 PM  Last 3 Weights  Weight (lbs) 325 lb 300 lb 288 lb  Weight (kg) 147.419 kg 136.079 kg 130.636 kg     Body mass index is 44.08 kg/m.  General:  Well nourished, well developed, in no acute distress HEENT: normal Neck: no JVD Vascular: No carotid bruits; Distal pulses 2+ bilaterally Cardiac:  normal S1, S2; bradycardic but regular; no murmur  Lungs:  clear to  auscultation bilaterally, no wheezing, rhonchi or rales  Abd: soft, nontender, no hepatomegaly  Ext: no edema Musculoskeletal:  No deformities, BUE and BLE strength normal and equal Skin: warm and dry, 1+ edema bilaterally Neuro:  CNs 2-12 intact, no focal abnormalities noted Psych:  Normal affect   EKG:  The EKG was personally reviewed and demonstrates: A-fib with slow ventricular response, right bundle branch block, left anterior fascicular block Telemetry:  Telemetry was personally reviewed and demonstrates: Same as above  Relevant CV Studies: TTE 2022  Summary   1. Technically difficult study.    2. The left ventricle is normal in size with upper normal wall thickness.    3. The left ventricular systolic function is normal, LVEF is visually  estimated at 55-60%.    4. There is grade II diastolic dysfunction (elevated filling pressure).    5. The left atrium is severely dilated in size.    6. The right ventricle is normal in size, with normal systolic function.    7. IVC size and inspiratory change suggest mildly elevated right atrial  pressure. (5-10 mmHg).   Laboratory Data: High Sensitivity Troponin:   Recent Labs  Lab 06/15/24 1906  TROPONINIHS 27*     Chemistry Recent Labs  Lab 06/15/24 1853 06/15/24 1906  NA  --  135  K  --  4.2  CL  --  100  CO2  --  22  GLUCOSE  --  82  BUN  --  41*  CREATININE  --  1.73*  CALCIUM  --  8.7*  MG 2.2  --   GFRNONAA  --  43*  ANIONGAP  --  13    No results for input(s): PROT, ALBUMIN , AST, ALT, ALKPHOS, BILITOT in the last 168 hours. Lipids No results for input(s): CHOL, TRIG, HDL, LABVLDL, LDLCALC, CHOLHDL in the last 168 hours.  Hematology Recent Labs  Lab 06/15/24 1906  WBC 7.0  RBC 4.97  HGB 15.8  HCT 47.0  MCV 94.6  MCH 31.8  MCHC 33.6  RDW 12.7  PLT 95*   Thyroid No results for input(s): TSH, FREET4 in the last 168 hours.  BNP Recent Labs  Lab 06/15/24 1906  BNP 124.5*     DDimer No results for input(s): DDIMER in the last 168 hours.  Radiology/Studies:  DG Chest 1 View Result Date: 06/15/2024 CLINICAL DATA:  Chest pain and bradycardia EXAM: CHEST  1 VIEW COMPARISON:  Chest radiograph dated 01/03/2017 FINDINGS: Normal lung volumes. No focal consolidations. No pleural effusion or pneumothorax. Similar enlarged cardiomediastinal silhouette. No acute osseous abnormality. IMPRESSION: 1.  No focal consolidations. 2. Similar cardiomegaly. Electronically Signed   By: Limin  Xu M.D.   On: 06/15/2024 19:48   Assessment and Plan: Symptomatic permanent A-fib with slow ventricular response Bifascicular block Patient has evidence of significant conduction disease with symptomatic bradycardia.  His not on any nodal agents.  Volume exam is difficult to ascertain given body habitus, BUN and creatinine may suggest he is intravascularly dry given his aggressive outpatient diuresis and recent weight loss.  Needs admission for consideration of pacemaker given symptomatic bradycardia and significant conduction disease abnormalities.  Needs updated transthoracic imaging for evaluation and ventricular function as well as consideration for workup for ischemic etiology given his risk factors particularly if his EF is low.  Given permanent A-fib since at least 2019 he would not benefit from ablation. - Admit to medicine for management of chronic comorbidities and diabetes - Recommend TTE in the morning  - Pending results may need to consider left heart catheterization for ischemic evaluation -Recommend TSH -Telemetry - EP consult in a.m. - Hold diuretics at this time - Hold Xarelto  pending procedural plan, no bridging needed  3.  Elevated troponin Secondary to myocardial injury, presentation not consistent with acute coronary syndrome  Risk Assessment/Risk Scores:       New York  Heart Association (NYHA) Functional Class NYHA Class III  CHA2DS2-VASc Score =     This indicates a   % annual risk of stroke. The patient's score is based upon:          For questions or updates, please contact [facility] Please consult [email or web address] for contact info under    Signed, Ozell Bushman, MD  06/15/2024 10:06 PM

## 2024-06-16 ENCOUNTER — Encounter (HOSPITAL_COMMUNITY): Payer: Self-pay | Admitting: Internal Medicine

## 2024-06-16 ENCOUNTER — Observation Stay (HOSPITAL_BASED_OUTPATIENT_CLINIC_OR_DEPARTMENT_OTHER)

## 2024-06-16 DIAGNOSIS — I4821 Permanent atrial fibrillation: Secondary | ICD-10-CM | POA: Diagnosis present

## 2024-06-16 DIAGNOSIS — M109 Gout, unspecified: Secondary | ICD-10-CM | POA: Diagnosis present

## 2024-06-16 DIAGNOSIS — N179 Acute kidney failure, unspecified: Secondary | ICD-10-CM | POA: Diagnosis present

## 2024-06-16 DIAGNOSIS — E119 Type 2 diabetes mellitus without complications: Secondary | ICD-10-CM | POA: Diagnosis present

## 2024-06-16 DIAGNOSIS — I442 Atrioventricular block, complete: Secondary | ICD-10-CM | POA: Diagnosis present

## 2024-06-16 DIAGNOSIS — I110 Hypertensive heart disease with heart failure: Secondary | ICD-10-CM | POA: Diagnosis present

## 2024-06-16 DIAGNOSIS — R634 Abnormal weight loss: Secondary | ICD-10-CM | POA: Diagnosis present

## 2024-06-16 DIAGNOSIS — I509 Heart failure, unspecified: Secondary | ICD-10-CM

## 2024-06-16 DIAGNOSIS — R001 Bradycardia, unspecified: Secondary | ICD-10-CM | POA: Diagnosis present

## 2024-06-16 DIAGNOSIS — R7989 Other specified abnormal findings of blood chemistry: Secondary | ICD-10-CM | POA: Diagnosis present

## 2024-06-16 DIAGNOSIS — Z7985 Long-term (current) use of injectable non-insulin antidiabetic drugs: Secondary | ICD-10-CM | POA: Diagnosis not present

## 2024-06-16 DIAGNOSIS — E66813 Obesity, class 3: Secondary | ICD-10-CM | POA: Diagnosis present

## 2024-06-16 DIAGNOSIS — D6869 Other thrombophilia: Secondary | ICD-10-CM | POA: Diagnosis present

## 2024-06-16 DIAGNOSIS — I452 Bifascicular block: Secondary | ICD-10-CM | POA: Diagnosis present

## 2024-06-16 DIAGNOSIS — D696 Thrombocytopenia, unspecified: Secondary | ICD-10-CM | POA: Diagnosis present

## 2024-06-16 DIAGNOSIS — G4733 Obstructive sleep apnea (adult) (pediatric): Secondary | ICD-10-CM | POA: Diagnosis present

## 2024-06-16 DIAGNOSIS — I5042 Chronic combined systolic (congestive) and diastolic (congestive) heart failure: Secondary | ICD-10-CM | POA: Diagnosis present

## 2024-06-16 DIAGNOSIS — I3139 Other pericardial effusion (noninflammatory): Secondary | ICD-10-CM | POA: Diagnosis not present

## 2024-06-16 DIAGNOSIS — Z8249 Family history of ischemic heart disease and other diseases of the circulatory system: Secondary | ICD-10-CM | POA: Diagnosis not present

## 2024-06-16 DIAGNOSIS — T502X5A Adverse effect of carbonic-anhydrase inhibitors, benzothiadiazides and other diuretics, initial encounter: Secondary | ICD-10-CM | POA: Diagnosis present

## 2024-06-16 DIAGNOSIS — I5A Non-ischemic myocardial injury (non-traumatic): Secondary | ICD-10-CM | POA: Diagnosis present

## 2024-06-16 DIAGNOSIS — D8685 Sarcoid myocarditis: Secondary | ICD-10-CM | POA: Diagnosis present

## 2024-06-16 DIAGNOSIS — Z9884 Bariatric surgery status: Secondary | ICD-10-CM | POA: Diagnosis not present

## 2024-06-16 DIAGNOSIS — Z87891 Personal history of nicotine dependence: Secondary | ICD-10-CM | POA: Diagnosis not present

## 2024-06-16 DIAGNOSIS — Z6841 Body Mass Index (BMI) 40.0 and over, adult: Secondary | ICD-10-CM | POA: Diagnosis not present

## 2024-06-16 DIAGNOSIS — E785 Hyperlipidemia, unspecified: Secondary | ICD-10-CM | POA: Diagnosis present

## 2024-06-16 DIAGNOSIS — Y92239 Unspecified place in hospital as the place of occurrence of the external cause: Secondary | ICD-10-CM | POA: Diagnosis present

## 2024-06-16 DIAGNOSIS — Z7901 Long term (current) use of anticoagulants: Secondary | ICD-10-CM | POA: Diagnosis not present

## 2024-06-16 LAB — HEMOGLOBIN A1C
Hgb A1c MFr Bld: 5.6 % (ref 4.8–5.6)
Mean Plasma Glucose: 114.02 mg/dL

## 2024-06-16 LAB — CBC WITH DIFFERENTIAL/PLATELET
Abs Immature Granulocytes: 0.01 10*3/uL (ref 0.00–0.07)
Basophils Absolute: 0.1 10*3/uL (ref 0.0–0.1)
Basophils Relative: 1 %
Eosinophils Absolute: 0.4 10*3/uL (ref 0.0–0.5)
Eosinophils Relative: 6 %
HCT: 47.3 % (ref 39.0–52.0)
Hemoglobin: 16 g/dL (ref 13.0–17.0)
Immature Granulocytes: 0 %
Lymphocytes Relative: 32 %
Lymphs Abs: 2.1 10*3/uL (ref 0.7–4.0)
MCH: 31.6 pg (ref 26.0–34.0)
MCHC: 33.8 g/dL (ref 30.0–36.0)
MCV: 93.3 fL (ref 80.0–100.0)
Monocytes Absolute: 0.3 10*3/uL (ref 0.1–1.0)
Monocytes Relative: 5 %
Neutro Abs: 3.6 10*3/uL (ref 1.7–7.7)
Neutrophils Relative %: 56 %
Platelets: 110 10*3/uL — ABNORMAL LOW (ref 150–400)
RBC: 5.07 MIL/uL (ref 4.22–5.81)
RDW: 12.7 % (ref 11.5–15.5)
WBC: 6.4 10*3/uL (ref 4.0–10.5)
nRBC: 0 % (ref 0.0–0.2)

## 2024-06-16 LAB — ECHOCARDIOGRAM COMPLETE
Height: 72 in
S' Lateral: 4.4 cm
Weight: 5200 [oz_av]

## 2024-06-16 LAB — BASIC METABOLIC PANEL WITH GFR
Anion gap: 10 (ref 5–15)
BUN: 40 mg/dL — ABNORMAL HIGH (ref 8–23)
CO2: 25 mmol/L (ref 22–32)
Calcium: 8.8 mg/dL — ABNORMAL LOW (ref 8.9–10.3)
Chloride: 101 mmol/L (ref 98–111)
Creatinine, Ser: 1.62 mg/dL — ABNORMAL HIGH (ref 0.61–1.24)
GFR, Estimated: 47 mL/min — ABNORMAL LOW (ref >60)
Glucose, Bld: 93 mg/dL (ref 70–99)
Potassium: 3.8 mmol/L (ref 3.5–5.1)
Sodium: 136 mmol/L (ref 135–145)

## 2024-06-16 LAB — HEPATIC FUNCTION PANEL
ALT: 14 U/L (ref 0–44)
AST: 22 U/L (ref 15–41)
Albumin: 4 g/dL (ref 3.5–5.0)
Alkaline Phosphatase: 36 U/L — ABNORMAL LOW (ref 38–126)
Bilirubin, Direct: 0.2 mg/dL (ref 0.0–0.2)
Indirect Bilirubin: 0.9 mg/dL (ref 0.3–0.9)
Total Bilirubin: 1.1 mg/dL (ref 0.0–1.2)
Total Protein: 6.8 g/dL (ref 6.5–8.1)

## 2024-06-16 LAB — GLUCOSE, CAPILLARY
Glucose-Capillary: 116 mg/dL — ABNORMAL HIGH (ref 70–99)
Glucose-Capillary: 97 mg/dL (ref 70–99)

## 2024-06-16 LAB — MAGNESIUM: Magnesium: 2.2 mg/dL (ref 1.7–2.4)

## 2024-06-16 LAB — TSH: TSH: 7.976 u[IU]/mL — ABNORMAL HIGH (ref 0.350–4.500)

## 2024-06-16 LAB — CBG MONITORING, ED: Glucose-Capillary: 84 mg/dL (ref 70–99)

## 2024-06-16 LAB — HIV ANTIBODY (ROUTINE TESTING W REFLEX): HIV Screen 4th Generation wRfx: NONREACTIVE

## 2024-06-16 MED ORDER — AMLODIPINE BESYLATE 5 MG PO TABS
5.0000 mg | ORAL_TABLET | Freq: Every day | ORAL | Status: DC
  Administered 2024-06-17 – 2024-06-19 (×2): 5 mg via ORAL
  Filled 2024-06-16 (×2): qty 1

## 2024-06-16 MED ORDER — ENOXAPARIN SODIUM 40 MG/0.4ML IJ SOSY: 40.0000 mg | PREFILLED_SYRINGE | INTRAMUSCULAR | Status: DC

## 2024-06-16 MED ORDER — HEPARIN (PORCINE) 25000 UT/250ML-% IV SOLN
1550.0000 [IU]/h | INTRAVENOUS | Status: DC
  Administered 2024-06-16 – 2024-06-18 (×3): 1550 [IU]/h via INTRAVENOUS
  Filled 2024-06-16 (×3): qty 250

## 2024-06-16 MED ORDER — ALLOPURINOL 300 MG PO TABS
300.0000 mg | ORAL_TABLET | Freq: Every day | ORAL | Status: DC
  Administered 2024-06-16 – 2024-06-19 (×3): 300 mg via ORAL
  Filled 2024-06-16 (×2): qty 1
  Filled 2024-06-16: qty 3

## 2024-06-16 MED ORDER — DOXAZOSIN MESYLATE 8 MG PO TABS
8.0000 mg | ORAL_TABLET | Freq: Every day | ORAL | Status: DC
  Administered 2024-06-17 – 2024-06-19 (×2): 8 mg via ORAL
  Filled 2024-06-16 (×4): qty 1

## 2024-06-16 MED ORDER — AMLODIPINE BESYLATE 5 MG PO TABS: 10.0000 mg | ORAL_TABLET | Freq: Every day | ORAL | Status: DC

## 2024-06-16 MED ORDER — ATROPINE SULFATE 1 MG/ML IV SOLN
0.5000 mg | Freq: Once | INTRAVENOUS | Status: DC | PRN
  Filled 2024-06-16: qty 0.5

## 2024-06-16 MED ORDER — LISINOPRIL 20 MG PO TABS: 40.0000 mg | ORAL_TABLET | Freq: Every day | ORAL | Status: DC

## 2024-06-16 MED ORDER — ENOXAPARIN SODIUM 80 MG/0.8ML IJ SOSY
70.0000 mg | PREFILLED_SYRINGE | INTRAMUSCULAR | Status: DC
  Administered 2024-06-16: 70 mg via SUBCUTANEOUS
  Filled 2024-06-16: qty 0.7

## 2024-06-16 MED ORDER — PERFLUTREN LIPID MICROSPHERE
1.0000 mL | INTRAVENOUS | Status: AC | PRN
  Administered 2024-06-16: 5 mL via INTRAVENOUS

## 2024-06-16 MED ORDER — INSULIN ASPART 100 UNIT/ML IJ SOLN: 0.0000 [IU] | Freq: Three times a day (TID) | INTRAMUSCULAR | Status: DC

## 2024-06-16 NOTE — ED Notes (Signed)
 3E secretary notified pt coming to floor

## 2024-06-16 NOTE — ED Notes (Signed)
 Pt HR decreased to 25 as shown on ecg monitor while sleeping, pt asymptomatic and denies any shob or pain. BP 121/57. Cardiology paged and notified, no new orders.

## 2024-06-16 NOTE — Progress Notes (Signed)
 PHARMACY - ANTICOAGULATION CONSULT NOTE  Pharmacy Consult for heparin  Indication: atrial fibrillation  No Known Allergies  Patient Measurements: Height: 6' (182.9 cm) Weight: (!) 147.4 kg (325 lb) IBW/kg (Calculated) : 77.6 HEPARIN  DW (KG): 112.1  Vital Signs: Temp: 98.2 F (36.8 C) (06/24 1956) Temp Source: Oral (06/24 1956) BP: 144/63 (06/24 1956) Pulse Rate: 35 (06/24 1956)  Labs: Recent Labs    06/15/24 1906 06/15/24 2117 06/16/24 0201  HGB 15.8  --  16.0  HCT 47.0  --  47.3  PLT 95*  --  110*  CREATININE 1.73*  --  1.62*  TROPONINIHS 27* 29*  --     Estimated Creatinine Clearance: 66.9 mL/min (A) (by C-G formula based on SCr of 1.62 mg/dL (H)).   Medical History: Past Medical History:  Diagnosis Date   A-fib [facility])    Arthritis    Cellulitis and abscess of leg 2009   CHF (congestive heart failure) (HCC)    Dupuytren's disease    Dyspnea    at times   Dysrhythmia    External hemorrhoids    Gout    HTN (hypertension)    Hyperlipidemia    Hyperuricemia    Obstructive sleep apnea    cpap   Pneumonia    Polysubstance abuse (HCC)    Toenail fungus    Type 2 diabetes mellitus (HCC)    type 2, diet controlled after gastric band surgery    Medications:  Scheduled:   allopurinol   300 mg Oral Daily   [START ON 06/17/2024] amLODipine   5 mg Oral Daily   doxazosin   8 mg Oral Daily   insulin  aspart  0-9 Units Subcutaneous TID WC    Assessment: 67 yom presenting with bradycardia found to have evidence of complete heart block. Was on Xarelto  PTA for hx Afib (LD 6/23 AM) - was starting on enoxaparin  70 mg on 6/24@1025 . Will now change to heparin  gtt in case plan for device from EP.   Hgb 16, plt 110. No s/sx of bleeding. Given recent DOAC will monitor both aPTT and heparin  level until correlate.   Goal of Therapy:  Heparin  level 0.3-0.7 units/ml aPTT 66-102 seconds Monitor platelets by anticoagulation protocol: Yes   Plan:  Stop enoxaparin  Start  heparin  infusion at 1550 units/hr  Order heparin  level and aPTT in 6 hrs  Monitor daily HL/aPTT until correlate, CBC, and for s/sx of bleeding   Thank you for allowing pharmacy to participate in this patient's care,  Suzen Sour, PharmD, BCCCP Clinical Pharmacist  Phone: [phone] 06/16/2024 8:09 PM  Please check AMION for all [facility] phone numbers After 10:00 PM, call Main Pharmacy [phone]

## 2024-06-16 NOTE — ED Notes (Signed)
 Pt remains asymptomatic, pt denies any symptoms.

## 2024-06-16 NOTE — ED Provider Notes (Signed)
 [place] EMERGENCY DEPARTMENT AT [facility] Provider Note   CSN: [id number] Arrival date & time: 06/15/24  1700     Patient presents with: Bradycardia   Marco James is a 68 y.o. male.   68 year old male presents for evaluation of bradycardia.  States he has been feeling unwell for a while but has gotten worse over the last few days.  Has noted some increased swelling in his legs despite increase in his torsemide.  Has not noticed any increase in urinary output.  States he has been more short of breath at times as well as fatigue.  He was at his primary care doctor's office earlier today for the symptoms and was sent here for low heart rate.  States a few years ago he had a low heart rate as well and his metoprolol  was stopped.  He states he was told he needed a pacemaker but seemed things seem to improve so he never got 1.  He denies any syncope, lightheadedness or chest pain.  Denies any other symptoms or concerns at this time.        Prior to Admission medications   Medication Sig Start Date End Date Taking? Authorizing Provider  allopurinol  (ZYLOPRIM ) 300 MG tablet Take 300 mg by mouth daily.   Yes [provider]  amLODipine  (NORVASC ) 10 MG tablet Take 10 mg by mouth daily.   Yes [provider]  colchicine 0.6 MG tablet Take 0.6-1.2 mg by mouth daily. 05/13/24  Yes [provider]  doxazosin  (CARDURA ) 8 MG tablet Take 2 tablets (16 mg total) by mouth daily. Patient taking differently: Take 8 mg by mouth daily. 12/26/18  Yes Gollan, Timothy J, MD  lisinopril  (ZESTRIL ) 40 MG tablet Take 1 tablet (40 mg total) by mouth daily. Please call to schedule office visit for further refills. Thank you! 01/18/20  Yes Gollan, Timothy J, MD  potassium chloride  SA (KLOR-CON ) 20 MEQ tablet Take 2 tablets (40 mEq total) by mouth 2 (two) times daily. Please call to schedule appointment for further refills. Thank you! Patient taking differently: Take 40 mEq by  mouth daily. Please call to schedule appointment for further refills. Thank you! 01/18/20  Yes Gollan, Timothy J, MD  tirzepatide [facility]) 2.5 MG/0.5ML Pen Inject 2.5 mg into the skin once a week. Thursday   Yes [provider]  torsemide (DEMADEX) 20 MG tablet Take 60 mg by mouth daily. 05/28/24  Yes [provider]  XARELTO  20 MG TABS tablet TAKE 1 TABLET(20 MG) BY MOUTH DAILY WITH SUPPER 07/24/19  Yes Gollan, Timothy J, MD  furosemide  (LASIX ) 40 MG tablet Take 1 tablet (40 mg total) by mouth daily. Patient not taking: Reported on 06/15/2024 12/16/18   Gollan, Timothy J, MD    Allergies: Patient has no known allergies.    Review of Systems  Constitutional:  Positive for fatigue. Negative for chills, diaphoresis and fever.  HENT:  Negative for ear pain and sore throat.   Eyes:  Negative for pain and visual disturbance.  Respiratory:  Positive for shortness of breath. Negative for cough.   Cardiovascular:  Positive for leg swelling. Negative for chest pain and palpitations.  Gastrointestinal:  Negative for abdominal pain and vomiting.  Genitourinary:  Negative for dysuria and hematuria.  Musculoskeletal:  Negative for arthralgias and back pain.  Skin:  Negative for color change and rash.  Neurological:  Negative for seizures and syncope.  All other systems reviewed and are negative.   Updated Vital Signs  BP 118/61   Pulse (!) 36   Temp 98 F (36.7 C) (Oral)   Resp 15   Ht 6' (1.829 m)   Wt (!) 147.4 kg   SpO2 99%   BMI 44.08 kg/m   Physical Exam Vitals and nursing note reviewed.  Constitutional:      General: He is not in acute distress.    Appearance: He is well-developed.  HENT:     Head: Normocephalic and atraumatic.   Eyes:     Conjunctiva/sclera: Conjunctivae normal.    Cardiovascular:     Rate and Rhythm: Regular rhythm. Bradycardia present.     Heart sounds: Normal heart sounds. No murmur heard. Pulmonary:     Effort: Pulmonary effort is  normal. No respiratory distress.     Breath sounds: Normal breath sounds. No stridor. No wheezing or rhonchi.  Abdominal:     Palpations: Abdomen is soft.     Tenderness: There is no abdominal tenderness.   Musculoskeletal:        General: No swelling.     Cervical back: Neck supple.     Right lower leg: Edema present.     Left lower leg: Edema present.   Skin:    General: Skin is warm and dry.     Capillary Refill: Capillary refill takes less than 2 seconds.   Neurological:     Mental Status: He is alert.   Psychiatric:        Mood and Affect: Mood normal.     (all labs ordered are listed, but only abnormal results are displayed) Labs Reviewed  BASIC METABOLIC PANEL WITH GFR - Abnormal; Notable for the following components:      Result Value   BUN 41 (*)    Creatinine, Ser 1.73 (*)    Calcium 8.7 (*)    GFR, Estimated 43 (*)    All other components within normal limits  CBC - Abnormal; Notable for the following components:   Platelets 95 (*)    All other components within normal limits  BRAIN NATRIURETIC PEPTIDE - Abnormal; Notable for the following components:   B Natriuretic Peptide 124.5 (*)    All other components within normal limits  TROPONIN I (HIGH SENSITIVITY) - Abnormal; Notable for the following components:   Troponin I (High Sensitivity) 27 (*)    All other components within normal limits  TROPONIN I (HIGH SENSITIVITY) - Abnormal; Notable for the following components:   Troponin I (High Sensitivity) 29 (*)    All other components within normal limits  MAGNESIUM    EKG: EKG Interpretation Date/Time:  Monday June 15 2024 18:57:25 EDT Ventricular Rate:  36 PR Interval:    QRS Duration:  170 QT Interval:  584 QTC Calculation: 452 R Axis:   -67  Text Interpretation: Atrial fibrillation with slow ventricular response RBBB and LAFB Widened qrs no STEMI All new changes when compared to previous EKG from 01/03/2017 Confirmed by Gennaro Bouchard ([id number]) on  06/15/2024 7:39:14 PM  Radiology: [facility] Chest 1 View Result Date: 06/15/2024 CLINICAL DATA:  Chest pain and bradycardia EXAM: CHEST  1 VIEW COMPARISON:  Chest radiograph dated 01/03/2017 FINDINGS: Normal lung volumes. No focal consolidations. No pleural effusion or pneumothorax. Similar enlarged cardiomediastinal silhouette. No acute osseous abnormality. IMPRESSION: 1.  No focal consolidations. 2. Similar cardiomegaly. Electronically Signed   By: Limin  Xu M.D.   On: 06/15/2024 19:48     Procedures   Medications Ordered in the ED - No data to display  Medical Decision Making Medical Decision Making Nursing notes are reviewed. Differential diagnosis for this patient would include but not limited to: Symptomatic bradycardia, heart block, arrhythmia, electrolyte abnormality, ACS, CHF, other  Records reviewed: Prior records reviewed and patient follows up regularly for his hypertension and diabetes in the outpatient office   Consults: Cardiology-I spoke with cardiologist in the morning on the phone and he recommend admission to the hospitalist service and will consult on the patient  Studies: Interpreted independently radiology and chest x-ray shows cardiomegaly but no other acute abnormality  EKG interpretation: Interpreted by me in the absence of cardiology and shows A-fib with a slow ventricular response, no STEMI  Cardiac monitor interpretation: A-fib, bradycardic  Emergency Department Course:  Vital signs and pulse oximetry are reviewed, evaluated by myself and found to be within normal limits prior to final disposition. Findings of laboratory testing and medical imaging are discussed with patient and family that is available. Patient agrees with the medical care plan as follows:  Patient sent in for bradycardia and his heart rate is 30s to 40s but otherwise he has stable vital signs and is fairly asymptomatic sitting in bed.  Discussed with  cardiology as above and they recommend admission to hospital service we will consult.  Patient found to have slow A-fib on EKG.  Patient's lab reviewed and he is found to have a slightly elevated troponin evaded BNP.  All results and plan discussed with patient and family at bedside they are agreeable for admission.   Problems Addressed: Bilateral leg edema: undiagnosed new problem with uncertain prognosis Elevated troponin: undiagnosed new problem with uncertain prognosis Symptomatic bradycardia: acute illness or injury that poses a threat to life or bodily functions  Amount and/or Complexity of Data Reviewed External Data Reviewed: notes. Labs: ordered. Decision-making details documented in ED Course. Radiology: ordered and independent interpretation performed. Decision-making details documented in ED Course. ECG/medicine tests: ordered and independent interpretation performed. Decision-making details documented in ED Course.  Risk OTC drugs. Prescription drug management. Decision regarding hospitalization.    Final diagnoses:  Symptomatic bradycardia  Elevated troponin  Bilateral leg edema    ED Discharge Orders     None          Gennaro Duwaine CROME, DO 06/16/24 0007

## 2024-06-16 NOTE — ED Notes (Signed)
 ECHO at bedside.

## 2024-06-16 NOTE — Consult Note (Signed)
 ELECTROPHYSIOLOGY CONSULT NOTE    Patient ID: Marco James MRN: [id number], DOB/AGE: [birth year] 68 y.o.  Admit date: 06/15/2024 Date of Consult: 06/16/2024  Primary Physician: Derenda Rockers, MD Primary Cardiologist: None  Electrophysiologist: Dr. Cindie   Referring Provider: Dr. Celine  Patient Profile: Marco James is a 68 y.o. male with a history of HTN, HLD, obesity, DM II who is being seen today for the evaluation of bradycardia at the request of Dr. Celine.  HPI:  Marco James is a 68 y.o. male who presented to [facility] on 6/23 with reports of worsening shortness of breath, increased swelling.  Patient reports he has been feeling poorly since the beginning of the year with increasing exertional dyspnea and volume accumulation.  He reports that he has had insurance changes and has been in between providers in the [facility] system and [facility].  He recently has had an escalating dose of diuretics-on torsemide and as needed metolazone  with a loss of 27 pounds over the last few weeks.  He was seen by his PCP and noted to have a heart rate in the 40s.  He presented to the emergency room for evaluation.  In ER he was found to have A-fib with slow ventricular response.  He believes his heart rate has possibly been slow since January.  He is on Xarelto  and has not missed any doses.  He has never had an ischemic workup.  Troponin was negative in the ER, creatinine 1.7 (up from 1.1), BNP 124.  CXR was negative.  In 2019 he was found to be bradycardic on metoprolol  and this was discontinued.  He denies chest pain, palpitations, dyspnea, PND, orthopnea, nausea, vomiting, dizziness, syncope, edema, weight gain, or early satiety.   Labs Potassium3.8 (06/24 0201) Magnesium  2.2 (06/24 0201) Creatinine, ser  1.62* (06/24 0201) PLT  110* (06/24 0201) HGB  16.0 (06/24 0201) WBC 6.4 (06/24 0201) Troponin I (High Sensitivity)29* (06/23 2117).    Past Medical History:  Diagnosis Date   A-fib  [facility])    Arthritis    Cellulitis and abscess of leg 2009   CHF (congestive heart failure) (HCC)    Dupuytren's disease    Dyspnea    at times   Dysrhythmia    External hemorrhoids    Gout    HTN (hypertension)    Hyperlipidemia    Hyperuricemia    Obstructive sleep apnea    cpap   Pneumonia    Polysubstance abuse (HCC)    Toenail fungus    Type 2 diabetes mellitus (HCC)    type 2, diet controlled after gastric band surgery     Surgical History:  Past Surgical History:  Procedure Laterality Date   COLONOSCOPY WITH PROPOFOL  N/A 10/25/2015   Procedure: COLONOSCOPY WITH PROPOFOL ;  Surgeon: Rogelia Copping, MD;  Location: [facility] ENDOSCOPY;  Service: Endoscopy;  Laterality: N/A;   LAPAROSCOPIC GASTRIC SLEEVE RESECTION N/A 11/18/2017   Procedure: LAPAROSCOPIC GASTRIC SLEEVE RESECTION WITH UPPER ENDOSCOPY;  Surgeon: Gladis Cough, MD;  Location: [facility] ORS;  Service: General;  Laterality: N/A;   LAPAROSCOPIC GASTRIC SLEEVE RESECTION     11/18/17 Dr. Gladis   NASAL SINUS SURGERY     WISDOM TOOTH EXTRACTION       Medications Prior to Admission  Medication Sig Dispense Refill Last Dose/Taking   allopurinol  (ZYLOPRIM ) 300 MG tablet Take 300 mg by mouth daily.   06/15/2024 Morning   amLODipine  (NORVASC ) 10 MG tablet Take 10 mg by mouth daily.   06/15/2024  Morning   colchicine 0.6 MG tablet Take 0.6-1.2 mg by mouth daily.   06/15/2024 Morning   doxazosin  (CARDURA ) 8 MG tablet Take 2 tablets (16 mg total) by mouth daily. (Patient taking differently: Take 8 mg by mouth daily.) 180 tablet 3 06/15/2024 Morning   lisinopril  (ZESTRIL ) 40 MG tablet Take 1 tablet (40 mg total) by mouth daily. Please call to schedule office visit for further refills. Thank you! 30 tablet 0 06/15/2024 Morning   potassium chloride  SA (KLOR-CON ) 20 MEQ tablet Take 2 tablets (40 mEq total) by mouth 2 (two) times daily. Please call to schedule appointment for further refills. Thank you! (Patient taking differently: Take 40 mEq by  mouth daily. Please call to schedule appointment for further refills. Thank you!) 120 tablet 0 06/15/2024 Morning   tirzepatide (MOUNJARO) 2.5 MG/0.5ML Pen Inject 2.5 mg into the skin once a week. Thursday   06/11/2024   torsemide (DEMADEX) 20 MG tablet Take 60 mg by mouth daily.   06/15/2024   XARELTO  20 MG TABS tablet TAKE 1 TABLET(20 MG) BY MOUTH DAILY WITH SUPPER 30 tablet 6 06/15/2024 at 10:00 AM   furosemide  (LASIX ) 40 MG tablet Take 1 tablet (40 mg total) by mouth daily. (Patient not taking: Reported on 06/15/2024) 90 tablet 3 Not Taking    Inpatient Medications:   allopurinol   300 mg Oral Daily   [START ON 06/17/2024] amLODipine   5 mg Oral Daily   doxazosin   8 mg Oral Daily   enoxaparin  (LOVENOX ) injection  70 mg Subcutaneous Q24H   insulin  aspart  0-9 Units Subcutaneous TID WC    Allergies: No Known Allergies  Family History  Problem Relation Age of Onset   Heart disease Father 42     Physical Exam: Vitals:   06/16/24 1200 06/16/24 1245 06/16/24 1300 06/16/24 1410  BP: 131/67 (!) 140/60  (!) 141/53  Pulse: (!) 32 (!) 35 (!) 38 (!) 37  Resp: 14 10 12 18   Temp:    97.8 F (36.6 C)  TempSrc:    Oral  SpO2: 100% 100% 100% 96%  Weight:      Height:        GEN- NAD, A&O x 3, normal affect HEENT: Normocephalic, atraumatic Lungs- CTAB, Normal effort.  Heart- Regular rate and rhythm, No M/G/R.  GI- Soft, NT, ND.  Extremities- No clubbing, cyanosis, or edema   Radiology/Studies: ECHOCARDIOGRAM COMPLETE Result Date: 06/16/2024    ECHOCARDIOGRAM REPORT   Patient Name:   Marco James [facility] Date of Exam: 06/16/2024 Medical Rec #:  [id number]      Height:       72.0 in Accession #:    [id number]     Weight:       325.0 lb Date of Birth:  [birth year]     BSA:          2.619 m Patient Age:    68 years       BP:           116/76 mmHg Patient Gender: M              HR:           32 bpm. Exam Location:  Inpatient Procedure: 2D Echo, Cardiac Doppler, Color Doppler and Intracardiac             Opacification Agent (Both Spectral and Color Flow Doppler were            utilized during procedure). Indications:    CHF  History:        Patient has no prior history of Echocardiogram examinations.                 CHF, Arrythmias:Atrial Fibrillation; Risk Factors:Sleep Apnea,                 Former Smoker, Dyslipidemia and Diabetes.  Sonographer:    Therisa Crouch Referring Phys: [phone] ARSHAD N KAKRAKANDY IMPRESSIONS  1. Left ventricular ejection fraction, by estimation, is 55 to 60%. The left ventricle has normal function. Left ventricular endocardial border not optimally defined to evaluate regional wall motion. There is mild concentric left ventricular hypertrophy. Left ventricular diastolic parameters are indeterminate.  2. Right ventricular systolic function is normal. The right ventricular size is severely enlarged.  3. The mitral valve was not well visualized. No evidence of mitral valve regurgitation.  4. The aortic valve was not well visualized. Aortic valve regurgitation is not visualized. No aortic stenosis is present.  5. The inferior vena cava is normal in size with greater than 50% respiratory variability, suggesting right atrial pressure of 3 mmHg.  6. This is a technically difficult study despite the use of contrast. Comparison(s): No prior Echocardiogram. FINDINGS  Left Ventricle: Left ventricular ejection fraction, by estimation, is 55 to 60%. The left ventricle has normal function. Left ventricular endocardial border not optimally defined to evaluate regional wall motion. The left ventricular internal cavity size was normal in size. There is mild concentric left ventricular hypertrophy. Left ventricular diastolic parameters are indeterminate. Right Ventricle: The right ventricular size is severely enlarged. Right vetricular wall thickness was not well visualized. Right ventricular systolic function is normal. Left Atrium: Left atrial size was not well visualized. Right Atrium: Right atrial size was  not well visualized. Pericardium: There is no evidence of pericardial effusion. Presence of epicardial fat layer. Mitral Valve: The mitral valve was not well visualized. Mild mitral annular calcification. No evidence of mitral valve regurgitation. Tricuspid Valve: The tricuspid valve is not well visualized. Tricuspid valve regurgitation is not demonstrated. Aortic Valve: The aortic valve was not well visualized. Aortic valve regurgitation is not visualized. No aortic stenosis is present. Pulmonic Valve: The pulmonic valve was not well visualized. Pulmonic valve regurgitation is not visualized. Aorta: The aortic root and ascending aorta are structurally normal, with no evidence of dilitation. Venous: The inferior vena cava is normal in size with greater than 50% respiratory variability, suggesting right atrial pressure of 3 mmHg. IAS/Shunts: The interatrial septum was not well visualized.  LEFT VENTRICLE PLAX 2D LVIDd:         5.80 cm LVIDs:         4.40 cm LV PW:         1.10 cm LV IVS:        1.20 cm LVOT diam:     2.30 cm LVOT Area:     4.15 cm  IVC IVC diam: 2.90 cm LEFT ATRIUM            Index LA diam:      6.10 cm  2.33 cm/m LA Vol (A4C): 138.0 ml 52.70 ml/m   AORTA Ao Root diam: 3.10 cm Ao Asc diam:  3.70 cm  SHUNTS Systemic Diam: 2.30 cm Stanly Leavens MD Electronically signed by Stanly Leavens MD Signature Date/Time: 06/16/2024/9:41:47 AM    Final    DG Chest 1 View Result Date: 06/15/2024 CLINICAL DATA:  Chest pain and bradycardia EXAM: CHEST  1 VIEW COMPARISON:  Chest radiograph dated 01/03/2017  FINDINGS: Normal lung volumes. No focal consolidations. No pleural effusion or pneumothorax. Similar enlarged cardiomediastinal silhouette. No acute osseous abnormality. IMPRESSION: 1.  No focal consolidations. 2. Similar cardiomegaly. Electronically Signed   By: Limin  Xu M.D.   On: 06/15/2024 19:48    EKG:AF with slow ventricular response 35 bpm, RBBB, LAFB (personally reviewed)  TELEMETRY:  AF with slow ventricular response, 30's  (personally reviewed)  DEVICE HISTORY: n/a   Assessment/Plan:  Symptomatic Bradycardia  Atrial Fibrillation with Slow Ventricular Response LVEF on admission 55-60%, RV severely enlarged.  Poor images. No reversible causes identified.  -assess cardiac MRI to rule out scar pattern / cardiac sarcoidosis before device placement as would dictate type of device  -telemetry monitoring -Xarelto  held by [facility] in anticipation of procedure > ok to initiate heparin  gtt until decision regarding device needs  -hemodynamically stable  -avoid AV nodal blockers  Hx Diastolic HF  -volume status per primary   Hypertension  -per TRH  AKI  -trend BMP / urinary output -Replace electrolytes as indicated -Avoid nephrotoxic agents, ensure adequate renal perfusion  OSA  -CPAP QHS     For questions or updates, please contact [facility] Please consult [email or web address] for contact info under     Signed, Daphne Barrack, NP-C, AGACNP-BC [facility] - Electrophysiology  06/16/2024, 4:36 PM

## 2024-06-16 NOTE — Progress Notes (Signed)
 Patient admitted from the ed with symptomatic bradycardia. Patient denies complaints. MP shows SB. Patient is a/o x4. Mae's x4. Patient and spouse oriented to room bed and call bell. No meds with patient's. Patient informed of belongings policy. Patient to keep phone and charger and personal care items at bedside.

## 2024-06-16 NOTE — Plan of Care (Signed)
 No c/o's of dizziness or pain. Patient for cardiac MRI tomorrow.

## 2024-06-16 NOTE — ED Notes (Signed)
 Pt refused CBG monitoring. RN notified.

## 2024-06-16 NOTE — ED Notes (Signed)
 Lunch tray at bedside. ?

## 2024-06-16 NOTE — ED Notes (Signed)
 Pt CBG 84, nurse notified. Pt resting in recliner at this time. Pt aware breakfast trey in room but would like later to eat. Call bell in reach.

## 2024-06-16 NOTE — Hospital Course (Addendum)
 Marco James is a 68 y.o. male with history of atrial fibrillation, CHF, sleep apnea, diabetes mellitus type 2, hypertension, gout has been recently treated for CHF exacerbation with increasing dose of torsemide.  Patient also noticed that his heart rate has been running in the lower 40s.  Patient states he has lost at least 27 pounds in the last few weeks from diuresis.  Feels fatigued and weak.  Denies any chest pain.  Due to persistent bradycardia patient presents to the ER.   In April 2020 patient was found to be bradycardic with slow A-fib and was admitted to the hospital in [facility]  at that time patient's metoprolol  was discontinued and following which heart rate improved.   ED Course: In the ER patient is found to be in A-fib with slow ventricular response.  Troponins were 27 and 29.  BNP 124.  Platelets 95.  Creatinine 1.7.   Cardiology on-call was consulted patient admitted for further observation.    Assessment & Plan:   Principal Problem:   Symptomatic bradycardia Active Problems:   Diastolic CHF, chronic (HCC)   Morbid obesity (HCC)   A-fib (HCC)   Diabetes mellitus type 2, diet-controlled (HCC)   Obstructive apnea   Essential (primary) hypertension   Gout   Elevated troponin   Thrombocytopenia (HCC)   ARF (acute renal failure) (HCC)    Symptomatic bradycardia with atrial fibrillation with slow ventricular response and bifascicular block.   - HR 33-35, BP 130/53 - -Denies any chest pain-resting comfortably in bed -Appreciate cardiology consult.   - Cardiology considering pacemaker placement. -  Holding Xarelto  in anticipation of procedure.  History of diastolic CHF  - last EF measured was in July 2022 which showed EF of 55 to 60% with grade 2 diastolic dysfunction.  Has had recent aggressive diuresis with hold diuretics for now as recommended by cardiologist.  Check 2D echo.  Elevated troponin -  has remained flat.   - Likely will need ischemic  workup.   -2D Echo:   - Denies any chest pain.  Hypertension  - on amlodipine  Cardura .   - Hold lisinopril  due to elevated creatinine.  Diabetes mellitus type 2  - recently was started on Mounjaro.   - Hemoglobin A1c is 5.6.   - On sliding scale coverage.  Sleep apnea on CPAP at bedtime.  Acute renal failure  - with creatinine 1.7 >>> 1.62   Last Cr on August 2023 was 0.6.   Likely increased from recent aggressive diuresis.  Hold lisinopril .  Follow metabolic panel.  Gout on allopurinol .  Thrombocytopenia appears to be chronic.

## 2024-06-16 NOTE — Progress Notes (Signed)
 PROGRESS NOTE    Patient: Marco James [facility]                            PCP: Derenda Rockers, MD                    DOB: 1956/03/13            DOA: 06/15/2024 FMW:[id number]             DOS: 06/16/2024, 10:35 AM   LOS: 0 days   Date of Service: The patient was seen and examined on 06/16/2024  Subjective:   The patient was seen and examined this morning. Heart rate 32-35, blood pressure 130/53, satting 94% Currently using CPAP for sleep. Easily arousable, denies any chest pain, denies of any dizziness or shortness of breath.   Brief Narrative:   Marco James is a 68 y.o. male with history of atrial fibrillation, CHF, sleep apnea, diabetes mellitus type 2, hypertension, gout has been recently treated for CHF exacerbation with increasing dose of torsemide.  Patient also noticed that his heart rate has been running in the lower 40s.  Patient states he has lost at least 27 pounds in the last few weeks from diuresis.  Feels fatigued and weak.  Denies any chest pain.  Due to persistent bradycardia patient presents to the ER.   In April 2020 patient was found to be bradycardic with slow A-fib and was admitted to the hospital in [facility]  at that time patient's metoprolol  was discontinued and following which heart rate improved.   ED Course: In the ER patient is found to be in A-fib with slow ventricular response.  Troponins were 27 and 29.  BNP 124.  Platelets 95.  Creatinine 1.7.   Cardiology on-call was consulted patient admitted for further observation.    Assessment & Plan:   Principal Problem:   Symptomatic bradycardia Active Problems:   Diastolic CHF, chronic (HCC)   Morbid obesity (HCC)   A-fib (HCC)   Diabetes mellitus type 2, diet-controlled (HCC)   Obstructive apnea   Essential (primary) hypertension   Gout   Elevated troponin   Thrombocytopenia (HCC)   ARF (acute renal failure) (HCC)    Symptomatic bradycardia with atrial fibrillation with slow  ventricular response and bifascicular block.   - HR 33-35, BP 130/53 - -Denies any chest pain-resting comfortably in bed -Appreciate cardiology consult.   - Cardiology considering pacemaker placement. -  Holding Xarelto  in anticipation of procedure.  History of diastolic CHF  - last EF measured was in July 2022 which showed EF of 55 to 60% with grade 2 diastolic dysfunction.  Has had recent aggressive diuresis with hold diuretics for now as recommended by cardiologist.  Check 2D echo.  Elevated troponin -  has remained flat.   - Likely will need ischemic workup.   -2D Echo:   - Denies any chest pain.  Hypertension  - on amlodipine  Cardura .   - Hold lisinopril  due to elevated creatinine.  Diabetes mellitus type 2  - recently was started on Mounjaro.   - Hemoglobin A1c is 5.6.   - On sliding scale coverage.  Sleep apnea on CPAP at bedtime.  Acute renal failure  - with creatinine 1.7 >>> 1.62   Last Cr on August 2023 was 0.6.   Likely increased from recent aggressive diuresis.  Hold lisinopril .  Follow metabolic panel.  Gout on allopurinol .  Thrombocytopenia appears to  be chronic.     --------------------------------------------------------------------------------------------------------------------------------------- Nutritional status:  The patient's BMI is: Body mass index is 44.08 kg/m. I agree with the assessment and plan as outlined   DVT prophylaxis:  SCDs   Code Status:   Code Status: Full Code  Family Communication: Wife present at bedside updated -Advance care planning has been discussed.   Admission status:   Status is: Inpatient Remains inpatient appropriate because: Symptomatic bradycardia needing cardiology EP evaluation   Disposition: From  - home             Planning for discharge in 1-2 days: to   Procedures:   No admission procedures for hospital encounter.   Antimicrobials:  Anti-infectives (From admission, onward)    None         Medication:   allopurinol   300 mg Oral Daily   [START ON 06/17/2024] amLODipine   5 mg Oral Daily   doxazosin   8 mg Oral Daily   enoxaparin  (LOVENOX ) injection  70 mg Subcutaneous Q24H   insulin  aspart  0-9 Units Subcutaneous TID WC    perflutren lipid microspheres (DEFINITY) IV suspension   Objective:   Vitals:   06/16/24 0800 06/16/24 0900 06/16/24 0930 06/16/24 1000  BP: (!) 145/60 (!) 120/57 (!) 141/59 (!) 130/53  Pulse: (!) 34 (!) 32 (!) 33 (!) 33  Resp: 13 15 14 14   Temp:      TempSrc:      SpO2: 98% 100% 98% 94%  Weight:      Height:       No intake or output data in the 24 hours ending 06/16/24 1035 Filed Weights   06/15/24 1722  Weight: (!) 147.4 kg     Physical examination:   Constitution:  Alert, cooperative, no distress,  Appears calm and comfortable  Psychiatric:   Normal and stable mood and affect, cognition intact,   HEENT:        Normocephalic, PERRL, otherwise with in Normal limits  Chest:         Chest symmetric Cardio vascular:  S1/S2, bradycardic, No murmure, No Rubs or Gallops  pulmonary: Clear to auscultation bilaterally, respirations unlabored, negative wheezes / crackles Abdomen: Soft, non-tender, non-distended, bowel sounds,no masses, no organomegaly Muscular skeletal: Limited exam - in bed, able to move all 4 extremities,   Neuro: CNII-XII intact. , normal motor and sensation, reflexes intact  Extremities: No pitting edema lower extremities, +2 pulses  Skin: Dry, warm to touch, negative for any Rashes, No open wounds Wounds: per nursing documentation   ------------------------------------------------------------------------------------------------------------------------------------------    LABs:     Latest Ref Rng & Units 06/16/2024    2:01 AM 06/15/2024    7:06 PM 08/26/2018   10:33 AM  CBC  WBC 4.0 - 10.5 K/uL 6.4  7.0  7.4   Hemoglobin 13.0 - 17.0 g/dL 83.9  84.1  86.5   Hematocrit 39.0 - 52.0 % 47.3  47.0  39.5   Platelets  150 - 400 K/uL 110  95  152       Latest Ref Rng & Units 06/16/2024    2:01 AM 06/15/2024    7:06 PM 08/26/2018   10:33 AM  CMP  Glucose 70 - 99 mg/dL 93  82  899   BUN 8 - 23 mg/dL 40  41  27   Creatinine 0.61 - 1.24 mg/dL 8.37  8.26  8.91   Sodium 135 - 145 mmol/L 136  135  141   Potassium 3.5 - 5.1 mmol/L 3.8  4.2  4.4   Chloride 98 - 111 mmol/L 101  100  103   CO2 22 - 32 mmol/L 25  22  25    Calcium 8.9 - 10.3 mg/dL 8.8  8.7  9.2   Total Protein 6.5 - 8.1 g/dL 6.8   5.9   Total Bilirubin 0.0 - 1.2 mg/dL 1.1   0.7   Alkaline Phos 38 - 126 U/L 36   51   AST 15 - 41 U/L 22   12   ALT 0 - 44 U/L 14   10        Micro Results No results found for this or any previous visit (from the past 240 hours).  Radiology Reports ECHOCARDIOGRAM COMPLETE Result Date: 06/16/2024    ECHOCARDIOGRAM REPORT   Patient Name:   CHEIKH BRAMBLE [facility] Date of Exam: 06/16/2024 Medical Rec #:  [id number]      Height:       72.0 in Accession #:    [id number]     Weight:       325.0 lb Date of Birth:  06-Mar-1956     BSA:          2.619 m Patient Age:    67 years       BP:           116/76 mmHg Patient Gender: M              HR:           32 bpm. Exam Location:  Inpatient Procedure: 2D Echo, Cardiac Doppler, Color Doppler and Intracardiac            Opacification Agent (Both Spectral and Color Flow Doppler were            utilized during procedure). Indications:    CHF  History:        Patient has no prior history of Echocardiogram examinations.                 CHF, Arrythmias:Atrial Fibrillation; Risk Factors:Sleep Apnea,                 Former Smoker, Dyslipidemia and Diabetes.  Sonographer:    Therisa Crouch Referring Phys: [phone] ARSHAD N KAKRAKANDY IMPRESSIONS  1. Left ventricular ejection fraction, by estimation, is 55 to 60%. The left ventricle has normal function. Left ventricular endocardial border not optimally defined to evaluate regional wall motion. There is mild concentric left ventricular hypertrophy. Left  ventricular diastolic parameters are indeterminate.  2. Right ventricular systolic function is normal. The right ventricular size is severely enlarged.  3. The mitral valve was not well visualized. No evidence of mitral valve regurgitation.  4. The aortic valve was not well visualized. Aortic valve regurgitation is not visualized. No aortic stenosis is present.  5. The inferior vena cava is normal in size with greater than 50% respiratory variability, suggesting right atrial pressure of 3 mmHg.  6. This is a technically difficult study despite the use of contrast. Comparison(s): No prior Echocardiogram. FINDINGS  Left Ventricle: Left ventricular ejection fraction, by estimation, is 55 to 60%. The left ventricle has normal function. Left ventricular endocardial border not optimally defined to evaluate regional wall motion. The left ventricular internal cavity size was normal in size. There is mild concentric left ventricular hypertrophy. Left ventricular diastolic parameters are indeterminate. Right Ventricle: The right ventricular size is severely enlarged. Right vetricular wall thickness was not well visualized. Right ventricular systolic function is normal. Left Atrium:  Left atrial size was not well visualized. Right Atrium: Right atrial size was not well visualized. Pericardium: There is no evidence of pericardial effusion. Presence of epicardial fat layer. Mitral Valve: The mitral valve was not well visualized. Mild mitral annular calcification. No evidence of mitral valve regurgitation. Tricuspid Valve: The tricuspid valve is not well visualized. Tricuspid valve regurgitation is not demonstrated. Aortic Valve: The aortic valve was not well visualized. Aortic valve regurgitation is not visualized. No aortic stenosis is present. Pulmonic Valve: The pulmonic valve was not well visualized. Pulmonic valve regurgitation is not visualized. Aorta: The aortic root and ascending aorta are structurally normal, with no  evidence of dilitation. Venous: The inferior vena cava is normal in size with greater than 50% respiratory variability, suggesting right atrial pressure of 3 mmHg. IAS/Shunts: The interatrial septum was not well visualized.  LEFT VENTRICLE PLAX 2D LVIDd:         5.80 cm LVIDs:         4.40 cm LV PW:         1.10 cm LV IVS:        1.20 cm LVOT diam:     2.30 cm LVOT Area:     4.15 cm  IVC IVC diam: 2.90 cm LEFT ATRIUM            Index LA diam:      6.10 cm  2.33 cm/m LA Vol (A4C): 138.0 ml 52.70 ml/m   AORTA Ao Root diam: 3.10 cm Ao Asc diam:  3.70 cm  SHUNTS Systemic Diam: 2.30 cm Stanly Leavens MD Electronically signed by Stanly Leavens MD Signature Date/Time: 06/16/2024/9:41:47 AM    Final    DG Chest 1 View Result Date: 06/15/2024 CLINICAL DATA:  Chest pain and bradycardia EXAM: CHEST  1 VIEW COMPARISON:  Chest radiograph dated 01/03/2017 FINDINGS: Normal lung volumes. No focal consolidations. No pleural effusion or pneumothorax. Similar enlarged cardiomediastinal silhouette. No acute osseous abnormality. IMPRESSION: 1.  No focal consolidations. 2. Similar cardiomegaly. Electronically Signed   By: Limin  Xu M.D.   On: 06/15/2024 19:48    SIGNED: Adriana DELENA Grams, MD, FHM. FAAFP. Jolynn Pack - [facility] hospitalist Time spent - 55 min.  In seeing, evaluating and examining the patient. Reviewing medical records, labs, drawn plan of care. [facility],  Pager (please use [email or web address] to page/ text) Please use Epic Secure Chat for non-urgent communication (7AM-7PM)  If 7PM-7AM, please contact night-coverage [email or web address], 06/16/2024, 10:35 AM

## 2024-06-16 NOTE — ED Notes (Signed)
 Pt resting with eyes closed, respirations spontaneous, even, and unlabored.

## 2024-06-17 ENCOUNTER — Inpatient Hospital Stay (HOSPITAL_COMMUNITY)

## 2024-06-17 DIAGNOSIS — I3139 Other pericardial effusion (noninflammatory): Secondary | ICD-10-CM

## 2024-06-17 DIAGNOSIS — R001 Bradycardia, unspecified: Secondary | ICD-10-CM | POA: Diagnosis not present

## 2024-06-17 LAB — HEPARIN LEVEL (UNFRACTIONATED): Heparin Unfractionated: 0.86 [IU]/mL — ABNORMAL HIGH (ref 0.30–0.70)

## 2024-06-17 LAB — BASIC METABOLIC PANEL WITH GFR
Anion gap: 6 (ref 5–15)
BUN: 38 mg/dL — ABNORMAL HIGH (ref 8–23)
CO2: 26 mmol/L (ref 22–32)
Calcium: 8.7 mg/dL — ABNORMAL LOW (ref 8.9–10.3)
Chloride: 104 mmol/L (ref 98–111)
Creatinine, Ser: 1.54 mg/dL — ABNORMAL HIGH (ref 0.61–1.24)
GFR, Estimated: 49 mL/min — ABNORMAL LOW (ref >60)
Glucose, Bld: 91 mg/dL (ref 70–99)
Potassium: 3.7 mmol/L (ref 3.5–5.1)
Sodium: 136 mmol/L (ref 135–145)

## 2024-06-17 LAB — APTT
aPTT: 78 s — ABNORMAL HIGH (ref 24–36)
aPTT: 74 s — ABNORMAL HIGH (ref 24–36)

## 2024-06-17 LAB — GLUCOSE, CAPILLARY
Glucose-Capillary: 80 mg/dL (ref 70–99)
Glucose-Capillary: 94 mg/dL (ref 70–99)

## 2024-06-17 MED ORDER — ONDANSETRON HCL 4 MG/2ML IJ SOLN: 4.0000 mg | Freq: Four times a day (QID) | INTRAMUSCULAR | Status: DC | PRN

## 2024-06-17 MED ORDER — POLYETHYLENE GLYCOL 3350 17 G PO PACK: 17.0000 g | PACK | Freq: Every day | ORAL | Status: DC | PRN

## 2024-06-17 MED ORDER — ACETAMINOPHEN 650 MG RE SUPP
650.0000 mg | Freq: Four times a day (QID) | RECTAL | Status: DC | PRN
Start: 2024-06-17 — End: 2024-06-19

## 2024-06-17 MED ORDER — GADOBUTROL 1 MMOL/ML IV SOLN
10.0000 mL | Freq: Once | INTRAVENOUS | Status: AC | PRN
  Administered 2024-06-17: 10 mL via INTRAVENOUS

## 2024-06-17 MED ORDER — ACETAMINOPHEN 325 MG PO TABS
650.0000 mg | ORAL_TABLET | Freq: Four times a day (QID) | ORAL | Status: DC | PRN
  Administered 2024-06-18: 650 mg via ORAL
  Filled 2024-06-17: qty 2

## 2024-06-17 MED ORDER — ONDANSETRON HCL 4 MG PO TABS: 4.0000 mg | ORAL_TABLET | Freq: Four times a day (QID) | ORAL | Status: DC | PRN

## 2024-06-17 NOTE — Progress Notes (Signed)
 PHARMACY - ANTICOAGULATION CONSULT NOTE  Pharmacy Consult for heparin  Indication: atrial fibrillation  No Known Allergies  Patient Measurements: Height: 6' (182.9 cm) Weight: (!) 147.4 kg (325 lb) IBW/kg (Calculated) : 77.6 HEPARIN  DW (KG): 112.1  Vital Signs: Temp: 98.2 F (36.8 C) (06/25 0005) Temp Source: Oral (06/25 0005) BP: 141/66 (06/25 0005) Pulse Rate: 36 (06/25 0005)  Labs: Recent Labs    06/15/24 1906 06/15/24 2117 06/16/24 0201 06/17/24 0305  HGB 15.8  --  16.0  --   HCT 47.0  --  47.3  --   PLT 95*  --  110*  --   APTT  --   --   --  78*  HEPARINUNFRC  --   --   --  0.86*  CREATININE 1.73*  --  1.62*  --   TROPONINIHS 27* 29*  --   --     Estimated Creatinine Clearance: 66.9 mL/min (A) (by C-G formula based on SCr of 1.62 mg/dL (H)).   Medical History: Past Medical History:  Diagnosis Date   A-fib [facility])    Arthritis    Cellulitis and abscess of leg 2009   CHF (congestive heart failure) (HCC)    Dupuytren's disease    Dyspnea    at times   Dysrhythmia    External hemorrhoids    Gout    HTN (hypertension)    Hyperlipidemia    Hyperuricemia    Obstructive sleep apnea    cpap   Pneumonia    Polysubstance abuse (HCC)    Toenail fungus    Type 2 diabetes mellitus (HCC)    type 2, diet controlled after gastric band surgery    Assessment: 67 yom presenting with bradycardia found to have evidence of complete heart block. Was on Xarelto  PTA for hx Afib (LD 6/23 AM) - was starting on enoxaparin  70 mg on 6/24@1025 . Will now change to heparin  gtt in case plan for device from EP.   Hgb 16, plt 110. No s/sx of bleeding. Given recent DOAC will monitor both aPTT and heparin  level until correlate.   AM: aPTT within goal with heparin  level falsely elevated given recent DOAC on 1550 units/hr. Per RN, no signs/symptoms of bleeding.  Goal of Therapy:  Heparin  level 0.3-0.7 units/ml aPTT 66-102 seconds Monitor platelets by anticoagulation protocol:  Yes   Plan:  Continue heparin  infusion at 1550 units/hr  Check confirmatory aPTT in 6 hrs  Monitor daily heparin  level/aPTT until correlate, CBC, and for s/sx of bleeding   Thank you for allowing pharmacy to participate in this patient's care,  Lynwood Poplar, PharmD, BCPS Clinical Pharmacist 06/17/2024 4:06 AM    Please check AMION for all [facility] Pharmacy phone numbers After 10:00 PM, call Main Pharmacy [phone]

## 2024-06-17 NOTE — Progress Notes (Addendum)
  Patient Name: Marco James [facility] Date of Encounter: 06/17/2024  Primary Cardiologist: None Electrophysiologist: None  Interval Summary   The patient is doing well today.  States he was excited as he went to MRI this am but had to come back as there was another patient emergency. Family at bedside.  At this time, the patient denies chest pain, shortness of breath, or any new concerns.  Vital Signs    Vitals:   06/16/24 1410 06/16/24 1956 06/17/24 0005 06/17/24 0418  BP: (!) 141/53 (!) 144/63 (!) 141/66 (!) 132/59  Pulse: (!) 37 (!) 35 (!) 36 (!) 32  Resp: 18 18 18 18   Temp: 97.8 F (36.6 C) 98.2 F (36.8 C) 98.2 F (36.8 C) 98 F (36.7 C)  TempSrc: Oral Oral Oral Oral  SpO2: 96% 97% 97% 93%  Weight:    (!) 145.6 kg  Height:        Intake/Output Summary (Last 24 hours) at 06/17/2024 1034 Last data filed at 06/17/2024 0700 Gross per 24 hour  Intake 113.66 ml  Output 300 ml  Net -186.34 ml   Filed Weights   06/15/24 1722 06/17/24 0418  Weight: (!) 147.4 kg (!) 145.6 kg    Physical Exam    GEN- The patient is well appearing, alert and oriented x 3 today.   Lungs- wearing nasal CPAP, Clear to ausculation bilaterally, normal work of breathing Cardiac- slow, largely regular, no murmurs, rubs or gallops GI- soft, NT, ND, + BS Extremities- no clubbing or cyanosis. 1-2+ BLE edema  Telemetry    AF with slow ventricular response 30-60's (personally reviewed)  Hospital Course    Balin [facility]grift [facility] is a 68 y.o. male with PMH of HTN, HLD, obesity, DM II admitted 06/16/24 for increased shortness of breath, lower extremity swelling and feeling poorly since Jan of 2025. Found to be in AF with slow ventricular response.   Assessment & Plan    Symptomatic Bradycardia  Conduction System Disease: RBBB / CHB  Permanent Atrial Fibrillation with Slow Ventricular Response LVEF on admission 55-60%, RV severely enlarged.  Poor images. No reversible causes identified. AF predates 2015.   -await cMRI  -anticipate he will require device pending imaging  -NPO for now in the event there is lab availability pending MRI review  -continue heparin  gtt for now, home Xarelto  on hold in anticipation of device placement  -hemodynamically stable   Secondary Hypercoagulable State  -continue heparin  infusion per pharmacy  -await cMRI, anticipate hold after MN for possible device 6/26   Hx Diastolic HF  -volume status per primary   Hypertension  -per TRH   AKI  -follow BMP / UOP -avoid nephrotoxic agents    For questions or updates, please contact [facility] Please consult [email or web address] for contact info under     Signed, Daphne Barrack, NP-C, AGACNP-BC [facility] - Electrophysiology  06/17/2024, 10:56 AM

## 2024-06-17 NOTE — Plan of Care (Addendum)
 Cardiac MRI completed today. Patient for further testing.

## 2024-06-17 NOTE — Progress Notes (Signed)
 [facility] Progress Note Patient: Marco James [facility] FMW:[id number] DOB: [birth year] DOA: 06/15/2024  DOS: the patient was seen and examined on 06/17/2024  Brief Hospital Course: PMH of paroxysmal A-fib, OSA, morbid obesity, chronic combined CHF, HTN, type II DM, presented to the hospital with complaints of bradycardia and fatigue. Was initially admitted at [facility].  Transferred here for EP evaluation. EP currently awaiting MRI report before pacemaker implant.  Assessment and Plan: Symptomatic bradycardia with persistent atrial fibrillation with slow ventricular response and bifascicular block.   Cardiology consulted. Cardiac MRI ordered currently report pending. Cardiology was contemplating pacemaker implant on 6/25.  Currently MRI is not read and cardiology has postponed the procedure for 6/26.  N.p.o. after midnight.  Monitor on telemetry.  Chronic diastolic CHF  Echocardiogram this admission EF 55 to 60%. Volume status is adequate.  Monitor.  Elevated troponin Mild elevation without any ACS pattern. Management per cardiology.  Hypertension  on amlodipine  Cardura .   Hold lisinopril  due to elevated creatinine.  Prior history of diabetes mellitus type 2.  Patient reports that he has lost significant weight and currently does not have a diagnosis of type 2 diabetes mellitus. recently was started on Mounjaro.   Hemoglobin A1c is 5.6.   Stop Checking CBG.  Obstructive sleep apnea  Continue CPAP nightly and as needed.  AKI. with creatinine 1.7 >>> 1.62   Last Cr on August 2023 was 0.6.   Likely increased from recent aggressive diuresis. Hold lisinopril .  Gout  on allopurinol .  Thrombocytopenia  appears to be chronic.  Obesity Class 3 Body mass index is 43.54 kg/m.  Placing the pt at higher risk of poor outcomes.   Subjective: No nausea no vomiting no fever no chills.  Frustrated with not able to eat anything.  Physical Exam: General: in Mild distress,  No Rash Cardiovascular: S1 and S2 Present, No Murmur Respiratory: Good respiratory effort, Bilateral Air entry present. No Crackles, No wheezes Abdomen: Bowel Sound present, No tenderness Extremities: No edema Neuro: Alert and oriented x3, no new focal deficit  Data Reviewed: I have Reviewed nursing notes, Vitals, and Lab results. Since last encounter, pertinent lab results CBC and BMP   . I have ordered test including CBC and BMP  .   Disposition: Status is: Inpatient Remains inpatient appropriate because: Cardiology ordering further workup before pacemaker implant.  Family Communication: Discussed with wife at bedside Level of care: Telemetry Cardiac   Vitals:   06/17/24 0005 06/17/24 0418 06/17/24 1221 06/17/24 1520  BP: (!) 141/66 (!) 132/59 (!) 143/61 130/64  Pulse: (!) 36 (!) 32 (!) 34 (!) 37  Resp: 18 18 18 18   Temp: 98.2 F (36.8 C) 98 F (36.7 C) 97.7 F (36.5 C) 97.8 F (36.6 C)  TempSrc: Oral Oral Oral Oral  SpO2: 97% 93%  95%  Weight:  (!) 145.6 kg    Height:         Author: Yetta Blanch, MD 06/17/2024 6:02 PM  Please look on [email or web address] to find out who is on call.

## 2024-06-17 NOTE — TOC CM/SW Note (Signed)
 Transition of Care [facility]) - Inpatient Brief Assessment   Patient Details  Name: Marco James MRN: [id number] Date of Birth: 06/13/1956  [facility] CM/SW Contact:    Waddell Barnie Rama, RN Phone Number: 06/17/2024, 3:08 PM   Clinical Narrative: From home with SO, Marco James<  has PCP and insurance on file, states has no HH services in place at this time or DME at home.  States SO will transport them home at [employer] and she is support system, states gets medications from [facility] on [address].  Pta self ambulatory.    Transition of Care Asessment: Insurance and Status: Insurance coverage has been reviewed Patient has primary care physician: Yes Home environment has been reviewed: home with SO Prior level of function:: indep Prior/Current Home Services: No current home services Social Drivers of Health Review: SDOH reviewed no interventions necessary Readmission risk has been reviewed: Yes Transition of care needs: no transition of care needs at this time

## 2024-06-17 NOTE — Plan of Care (Signed)

## 2024-06-17 NOTE — Progress Notes (Signed)
 Patient to MRI with SWOT RN.

## 2024-06-17 NOTE — Progress Notes (Signed)
 Patient and wife updated on plan of care.  Reviewed cMRI concerns and need for additional testing > cardiac PET.  Discussed differences between PPM and ICD indications. Coordinated with [facility] NM Department.  Will plan for strict NPO - no meds / nothing by mouth (last PO intake 4pm > small amt of spaghetti, broccoli - did not finish plate).  Will coordinate [facility] transport once time confirmed with NM at [facility].    Daphne Barrack, NP-C, AGACNP-BC [facility] - Electrophysiology  06/17/2024, 5:43 PM

## 2024-06-17 NOTE — Progress Notes (Signed)
   06/17/24 2155  BiPAP/CPAP/SIPAP  BiPAP/CPAP/SIPAP Pt Type Adult  BiPAP/CPAP/SIPAP  (home machine)  Mask Type Nasal pillows  Respiratory Rate 18 breaths/min  FiO2 (%) 21 %  Patient Home Machine Yes  Safety Check Completed by RT for Home Unit Yes, no issues noted  Patient Home Mask Yes  Patient Home Tubing Yes  Auto Titrate Yes  Device Plugged into RED Power Outlet Yes  BiPAP/CPAP /SiPAP Vitals  Pulse Rate (!) 44  Resp 18  SpO2 96 %  Bilateral Breath Sounds Clear;Diminished  MEWS Score/Color  MEWS Score 1  MEWS Score Color Green

## 2024-06-17 NOTE — Progress Notes (Signed)
 Patient remains off the floor for cardiac mri with swot RN.

## 2024-06-18 ENCOUNTER — Encounter (HOSPITAL_COMMUNITY): Payer: Self-pay | Admitting: Anesthesiology

## 2024-06-18 ENCOUNTER — Inpatient Hospital Stay (HOSPITAL_COMMUNITY)

## 2024-06-18 ENCOUNTER — Encounter (HOSPITAL_COMMUNITY)
Admission: EM | Disposition: A | Discharge status: Home / Self Care | Payer: Self-pay | Source: Home / Self Care | Attending: Internal Medicine

## 2024-06-18 DIAGNOSIS — I442 Atrioventricular block, complete: Principal | ICD-10-CM

## 2024-06-18 DIAGNOSIS — D8685 Sarcoid myocarditis: Secondary | ICD-10-CM

## 2024-06-18 DIAGNOSIS — R001 Bradycardia, unspecified: Secondary | ICD-10-CM

## 2024-06-18 HISTORY — PX: BIV ICD INSERTION CRT-D: EP1195

## 2024-06-18 LAB — CBC
HCT: 43.1 % (ref 39.0–52.0)
Hemoglobin: 14.8 g/dL (ref 13.0–17.0)
MCH: 31.7 pg (ref 26.0–34.0)
MCHC: 34.3 g/dL (ref 30.0–36.0)
MCV: 92.3 fL (ref 80.0–100.0)
Platelets: 80 10*3/uL — ABNORMAL LOW (ref 150–400)
RBC: 4.67 MIL/uL (ref 4.22–5.81)
RDW: 12.8 % (ref 11.5–15.5)
WBC: 5.5 10*3/uL (ref 4.0–10.5)
nRBC: 0 % (ref 0.0–0.2)

## 2024-06-18 LAB — BASIC METABOLIC PANEL WITH GFR
Anion gap: 19 — ABNORMAL HIGH (ref 5–15)
BUN: 26 mg/dL — ABNORMAL HIGH (ref 8–23)
CO2: 21 mmol/L — ABNORMAL LOW (ref 22–32)
Calcium: 9.5 mg/dL (ref 8.9–10.3)
Chloride: 101 mmol/L (ref 98–111)
Creatinine, Ser: 1.34 mg/dL — ABNORMAL HIGH (ref 0.61–1.24)
GFR, Estimated: 58 mL/min — ABNORMAL LOW (ref >60)
Glucose, Bld: 88 mg/dL (ref 70–99)
Potassium: 3.9 mmol/L (ref 3.5–5.1)
Sodium: 141 mmol/L (ref 135–145)

## 2024-06-18 LAB — APTT: aPTT: 79 s — ABNORMAL HIGH (ref 24–36)

## 2024-06-18 LAB — NM PET CT MYOCARDIAL SARCOIDOSIS
Nuc Stress EF: 45 %
Rest Nuclear Isotope Dose: 29.5 mCi

## 2024-06-18 LAB — HEPARIN LEVEL (UNFRACTIONATED): Heparin Unfractionated: 0.37 [IU]/mL (ref 0.30–0.70)

## 2024-06-18 SURGERY — BIV ICD INSERTION CRT-D

## 2024-06-18 MED ORDER — HEPARIN (PORCINE) IN NACL 1000-0.9 UT/500ML-% IV SOLN
INTRAVENOUS | Status: DC | PRN
  Administered 2024-06-18: 500 mL

## 2024-06-18 MED ORDER — LABETALOL HCL 5 MG/ML IV SOLN
INTRAVENOUS | Status: DC | PRN
Start: 2024-06-18 — End: 2024-06-18
  Administered 2024-06-18: 10 mg via INTRAVENOUS

## 2024-06-18 MED ORDER — LIDOCAINE HCL (PF) 1 % IJ SOLN
INTRAMUSCULAR | Status: DC | PRN
  Administered 2024-06-18: 45 mL

## 2024-06-18 MED ORDER — MELATONIN 5 MG PO TABS
5.0000 mg | ORAL_TABLET | Freq: Every evening | ORAL | Status: DC | PRN
  Filled 2024-06-18: qty 1

## 2024-06-18 MED ORDER — IOHEXOL 350 MG/ML SOLN
INTRAVENOUS | Status: DC | PRN
Start: 2024-06-18 — End: 2024-06-18
  Administered 2024-06-18: 10 mL

## 2024-06-18 MED ORDER — MIDAZOLAM HCL 5 MG/5ML IJ SOLN
INTRAMUSCULAR | Status: DC | PRN
Start: 2024-06-18 — End: 2024-06-18
  Administered 2024-06-18 (×2): 1 mg via INTRAVENOUS

## 2024-06-18 MED ORDER — MIDAZOLAM HCL 5 MG/5ML IJ SOLN
INTRAMUSCULAR | Status: AC
  Filled 2024-06-18: qty 5

## 2024-06-18 MED ORDER — LIDOCAINE HCL 1 % IJ SOLN
INTRAMUSCULAR | Status: AC
  Filled 2024-06-18: qty 60

## 2024-06-18 MED ORDER — LABETALOL HCL 5 MG/ML IV SOLN
INTRAVENOUS | Status: AC
  Filled 2024-06-18: qty 4

## 2024-06-18 MED ORDER — RUBIDIUM RB82 GENERATOR (RUBYFILL)
30.0000 | PACK | Freq: Once | INTRAVENOUS | Status: AC
Start: 2024-06-18 — End: 2024-06-18
  Administered 2024-06-18: 30 via INTRAVENOUS

## 2024-06-18 MED ORDER — CEFAZOLIN SODIUM-DEXTROSE 3-4 GM/150ML-% IV SOLN
3.0000 g | Freq: Once | INTRAVENOUS | Status: DC
  Filled 2024-06-18: qty 150

## 2024-06-18 MED ORDER — CHLORHEXIDINE GLUCONATE 4 % EX SOLN
4.0000 | Freq: Once | CUTANEOUS | Status: DC
  Administered 2024-06-18: 4 via TOPICAL

## 2024-06-18 MED ORDER — CEFAZOLIN SODIUM-DEXTROSE 3-4 GM/150ML-% IV SOLN
3.0000 g | INTRAVENOUS | Status: AC
  Administered 2024-06-18: 3 g via INTRAVENOUS
  Filled 2024-06-18: qty 150

## 2024-06-18 MED ORDER — FENTANYL CITRATE (PF) 100 MCG/2ML IJ SOLN
INTRAMUSCULAR | Status: DC | PRN
  Administered 2024-06-18 (×2): 25 ug via INTRAVENOUS

## 2024-06-18 MED ORDER — SODIUM CHLORIDE 0.9 % IV SOLN: INTRAVENOUS | Status: DC

## 2024-06-18 MED ORDER — FENTANYL CITRATE (PF) 100 MCG/2ML IJ SOLN
INTRAMUSCULAR | Status: AC
Start: 2024-06-18 — End: 2024-06-18
  Filled 2024-06-18: qty 2

## 2024-06-18 MED ORDER — SODIUM CHLORIDE 0.9 % IV SOLN
80.0000 mg | INTRAVENOUS | Status: AC
  Administered 2024-06-18: 80 mg
  Filled 2024-06-18: qty 2

## 2024-06-18 MED ORDER — FLUDEOXYGLUCOSE F - 18 (FDG) INJECTION
9.4800 | Freq: Once | INTRAVENOUS | Status: AC | PRN
  Administered 2024-06-18: 9.48 via INTRAVENOUS

## 2024-06-18 SURGICAL SUPPLY — 14 items
BALLOON COR SINUS VENO 6FR 80 (BALLOONS) IMPLANT
CABLE SURGICAL S-101-97-12 (CABLE) ×1 IMPLANT
CATH CPS DIRECT 135 DS2C020 (CATHETERS) IMPLANT
ICD GALLANT VR CDVRA500Q (ICD Generator) IMPLANT
KIT ESSENTIALS PG (KITS) IMPLANT
LEAD DURATA 7122Q-65CM (Lead) IMPLANT
PAD DEFIB RADIO PHYSIO CONN (PAD) ×1 IMPLANT
SHEATH 7FR PRELUDE SNAP 13 (SHEATH) IMPLANT
SHEATH 9.5FR PRELUDE SNAP 13 (SHEATH) IMPLANT
SHEATH PROBE COVER 6X72 (BAG) IMPLANT
SLITTER AGILIS HISPRO (INSTRUMENTS) IMPLANT
TRAY PACEMAKER INSERTION (PACKS) ×1 IMPLANT
WIRE HI TORQ VERSACORE-J 145CM (WIRE) IMPLANT
WIRE MICRO SET SILHO 5FR 7 (SHEATH) IMPLANT

## 2024-06-18 NOTE — Progress Notes (Signed)
 RN received report from McKenzie, RN to take over care to monitor patient and vitals signs. Sarcoidosis scan is complete. PT is still Alert and oriented x4. WE are awaiting transportation from [facility] at this time. Care continued.

## 2024-06-18 NOTE — Progress Notes (Signed)
 Received report on pt. From [facility] transfer from [facility]. Pt. Is here to receive a sarcoidosis scan pt. Reportedly has had a HR in the low 30's since his admission on Monday 06/15/24. The scan is to determine the type of ICD that will best suit him. Pt. Is alert, oriented with asymptomatic bradycardia. RN will keep a continued watch on him to ensure he remain stable throughout the scan.

## 2024-06-18 NOTE — Progress Notes (Addendum)
 [facility] Progress Note Patient: Marco James [facility] FMW:[id number] DOB: [birth year] DOA: 06/15/2024  DOS: the patient was seen and examined on 06/18/2024  Brief Hospital Course: PMH of paroxysmal A-fib, OSA, morbid obesity, chronic combined CHF, HTN, type II DM, presented to the hospital with complaints of bradycardia and fatigue. Was initially admitted at [facility].  Transferred here for EP evaluation. EP currently awaiting MRI report before pacemaker implant.  Assessment and Plan: Symptomatic bradycardia with persistent atrial fibrillation with slow ventricular response and bifascicular block.   Cardiology consulted. Cardiac MRI ordered currently report pending. Cardiology was contemplating pacemaker implant on 6/25. Before pacemaker implantation recommended cardiac MRI as well as nuclear medicine test to rule out sarcoidosis. Studies were negative for sarcoidosis. Patient will be undergoing pacemaker implant on 6/26. Monitor overnight.  Chronic diastolic CHF  Echocardiogram this admission EF 55 to 60%. Volume status is adequate.  Monitor. On torsemide at home 60 mg daily.  Elevated troponin Mild elevation without any ACS pattern. Management per cardiology.  Hypertension  on amlodipine  Cardura .   Hold lisinopril  due to elevated creatinine.  Prior history of diabetes mellitus type 2.  Patient reports that he has lost significant weight and currently does not have a diagnosis of type 2 diabetes mellitus. recently was started on Mounjaro.   Hemoglobin A1c is 5.6.   Stop Checking CBG.  Obstructive sleep apnea  Continue CPAP nightly and as needed.  AKI. Baseline creatinine 1.1 in February 2025. Trending up to 1.3 in May 2025. On admission creatinine was 1.7 and now trending down to 1.3. Suspecting combination of cardiorenal hemodynamics as well as third spacing. Anticipate improvement in cardiac output after pacemaker implant. Anticipate resolution improvement  in AKI after that. Will need to be back on torsemide though. Hold lisinopril .  Gout  on allopurinol .  Thrombocytopenia  appears to be chronic.  Although platelet counts were 120 in February. Recommend follow-up with PCP and most likely will require outpatient referral to hematology given the chronicity of thrombocytopenia. Currently heparin  on hold.  Monitor for significant bleeding.  Elevated anion gap. Etiology not clear. Currently asymptomatic. Will recheck.  Obesity Class 3 Body mass index is 43.54 kg/m.  Placing the pt at higher risk of poor outcomes.   Subjective: No nausea no vomiting.  No other acute complaint.  No bleeding.  Reports constipation.  Physical Exam: General: in Mild distress, No Rash Cardiovascular: S1 and S2 Present, No Murmur Respiratory: Good respiratory effort, Bilateral Air entry present. No Crackles, No wheezes Abdomen: Bowel Sound present, No tenderness Extremities: Bilateral edema Neuro: Alert and oriented x3, no new focal deficit  Data Reviewed: I have Reviewed nursing notes, Vitals, and Lab results. Since last encounter, pertinent lab results CBC and BMP   . I have ordered test including CBC and BMP  .  Discussed with EP.  Disposition: Status is: Inpatient Remains inpatient appropriate because: Monitor for post pacemaker recovery  Place and maintain sequential compression device Start: 06/18/24 1229   Family Communication: Family at bedside Level of care: Telemetry Cardiac   Vitals:   06/18/24 1115 06/18/24 1136 06/18/24 1250 06/18/24 1646  BP: (!) 148/66 (!) 144/58 (!) 162/71   Pulse: (!) 33 (!) 31 (!) 36   Resp:   18   Temp:   98.3 F (36.8 C)   TempSrc:   Oral   SpO2: 96% 96% 99% 96%  Weight:      Height:         Author: Yetta Blanch, MD  06/18/2024 6:00 PM  Please look on [email or web address] to find out who is on call.

## 2024-06-18 NOTE — H&P (View-Only) (Signed)
  Patient Name: Marco James [facility] Date of Encounter: 06/18/2024  Primary Cardiologist: None Electrophysiologist: None  Interval Summary   The patient is doing well today.  At this time, the patient denies chest pain, shortness of breath, or any new concerns.  Vital Signs    Vitals:   06/18/24 1000 06/18/24 1015 06/18/24 1030 06/18/24 1045  BP: (!) 126/54 (!) 149/57 (!) 138/55 131/78  Pulse: (!) 31 (!) 29 (!) 30 (!) 32  Resp:      Temp:      TempSrc:      SpO2: 97% 96% 94% 97%  Weight:      Height:        Intake/Output Summary (Last 24 hours) at 06/18/2024 1108 Last data filed at 06/18/2024 0448 Gross per 24 hour  Intake 528.77 ml  Output 1575 ml  Net -1046.23 ml   Filed Weights   06/15/24 1722 06/17/24 0418 06/18/24 0538  Weight: (!) 147.4 kg (!) 145.6 kg (!) 145.9 kg    Physical Exam    GEN- adult male lying in bed on CPAP, alert and oriented x 3 today.   Lungs- Clear to ausculation bilaterally, normal work of breathing Cardiac- Irregularly irregular rate and rhythm, no murmurs, rubs or gallops GI- soft, NT, ND, + BS Extremities- no clubbing or cyanosis. No edema  Telemetry    AF with CHB 30's  (personally reviewed)  Hospital Course    Marco James [facility] is a 68 y.o. male with PMH of HTN, HLD, obesity, DM II admitted 06/16/24 for increased shortness of breath, lower extremity swelling and feeling poorly since Jan of 2025. Found to be in AF with slow ventricular response.   Assessment & Plan    Symptomatic Bradycardia  Conduction System Disease: RBBB / CHB  Permanent Atrial Fibrillation with Slow Ventricular Response LVEF on admission 55-60%, RV severely enlarged.  Poor images. No reversible causes identified. AF predates 2015. Consider additional differential of amyloid.  -await cMRI  -continue NPO status for possible device placement 6/26  -device type to be determined pending cMRI results  -continue heparin  for now > stop heparin , discussed with [facility] ICU RN who is  monitoring patient to stop   -remains hemodynamically stable   Secondary Hypercoagulable State  -stop heparin  now (1115) for possible implant   Hx Diastolic HF  -volume status per primary   Hypertension  -per TRH   AKI  -Trend BMP / urinary output -Replace electrolytes as indicated -Avoid nephrotoxic agents, ensure adequate renal perfusion     For questions or updates, please contact [facility] Please consult [email or web address] for contact info under     Signed, Daphne Barrack, NP-C, AGACNP-BC [facility] - Electrophysiology  06/18/2024, 11:11 AM

## 2024-06-18 NOTE — Progress Notes (Signed)
  Patient Name: Marco James [facility] Date of Encounter: 06/18/2024  Primary Cardiologist: None Electrophysiologist: None  Interval Summary   The patient is doing well today.  At this time, the patient denies chest pain, shortness of breath, or any new concerns.  Vital Signs    Vitals:   06/18/24 1000 06/18/24 1015 06/18/24 1030 06/18/24 1045  BP: (!) 126/54 (!) 149/57 (!) 138/55 131/78  Pulse: (!) 31 (!) 29 (!) 30 (!) 32  Resp:      Temp:      TempSrc:      SpO2: 97% 96% 94% 97%  Weight:      Height:        Intake/Output Summary (Last 24 hours) at 06/18/2024 1108 Last data filed at 06/18/2024 0448 Gross per 24 hour  Intake 528.77 ml  Output 1575 ml  Net -1046.23 ml   Filed Weights   06/15/24 1722 06/17/24 0418 06/18/24 0538  Weight: (!) 147.4 kg (!) 145.6 kg (!) 145.9 kg    Physical Exam    GEN- adult male lying in bed on CPAP, alert and oriented x 3 today.   Lungs- Clear to ausculation bilaterally, normal work of breathing Cardiac- Irregularly irregular rate and rhythm, no murmurs, rubs or gallops GI- soft, NT, ND, + BS Extremities- no clubbing or cyanosis. No edema  Telemetry    AF with CHB 30's  (personally reviewed)  Hospital Course    Marco James [facility] is a 68 y.o. male with PMH of HTN, HLD, obesity, DM II admitted 06/16/24 for increased shortness of breath, lower extremity swelling and feeling poorly since Jan of 2025. Found to be in AF with slow ventricular response.   Assessment & Plan    Symptomatic Bradycardia  Conduction System Disease: RBBB / CHB  Permanent Atrial Fibrillation with Slow Ventricular Response LVEF on admission 55-60%, RV severely enlarged.  Poor images. No reversible causes identified. AF predates 2015. Consider additional differential of amyloid.  -await cMRI  -continue NPO status for possible device placement 6/26  -device type to be determined pending cMRI results  -continue heparin  for now > stop heparin , discussed with [facility] ICU RN who is  monitoring patient to stop   -remains hemodynamically stable   Secondary Hypercoagulable State  -stop heparin  now (1115) for possible implant   Hx Diastolic HF  -volume status per primary   Hypertension  -per TRH   AKI  -Trend BMP / urinary output -Replace electrolytes as indicated -Avoid nephrotoxic agents, ensure adequate renal perfusion     For questions or updates, please contact [facility] Please consult [email or web address] for contact info under     Signed, Marco Barrack, NP-C, AGACNP-BC [facility] - Electrophysiology  06/18/2024, 11:11 AM

## 2024-06-18 NOTE — Plan of Care (Addendum)
 Pt resting throughout shift. Aox4, VSS, c/o pain managed by PRNs. Tolerating diet. Voiding w/o difficulty. Left chest site dressing intake, minimal bloody drainage. Safety precautions in place, call light within reach. Family at bedside.   Problem: Education: Goal: Ability to describe self-care measures that may prevent or decrease complications (Diabetes Survival Skills Education) will improve Outcome: Progressing Goal: Individualized Educational Video(s) Outcome: Progressing   Problem: Coping: Goal: Ability to adjust to condition or change in health will improve Outcome: Progressing   Problem: Fluid Volume: Goal: Ability to maintain a balanced intake and output will improve Outcome: Progressing   Problem: Health Behavior/Discharge Planning: Goal: Ability to identify and utilize available resources and services will improve Outcome: Progressing Goal: Ability to manage health-related needs will improve Outcome: Progressing   Problem: Metabolic: Goal: Ability to maintain appropriate glucose levels will improve Outcome: Progressing   Problem: Nutritional: Goal: Maintenance of adequate nutrition will improve Outcome: Progressing Goal: Progress toward achieving an optimal weight will improve Outcome: Progressing   Problem: Skin Integrity: Goal: Risk for impaired skin integrity will decrease Outcome: Progressing   Problem: Tissue Perfusion: Goal: Adequacy of tissue perfusion will improve Outcome: Progressing   Problem: Education: Goal: Knowledge of General Education information will improve Description: Including pain rating scale, medication(s)/side effects and non-pharmacologic comfort measures Outcome: Progressing   Problem: Health Behavior/Discharge Planning: Goal: Ability to manage health-related needs will improve Outcome: Progressing   Problem: Clinical Measurements: Goal: Ability to maintain clinical measurements within normal limits will improve Outcome:  Progressing Goal: Will remain free from infection Outcome: Progressing Goal: Diagnostic test results will improve Outcome: Progressing Goal: Respiratory complications will improve Outcome: Progressing Goal: Cardiovascular complication will be avoided Outcome: Progressing   Problem: Activity: Goal: Risk for activity intolerance will decrease Outcome: Progressing   Problem: Nutrition: Goal: Adequate nutrition will be maintained Outcome: Progressing   Problem: Coping: Goal: Level of anxiety will decrease Outcome: Progressing   Problem: Elimination: Goal: Will not experience complications related to bowel motility Outcome: Progressing Goal: Will not experience complications related to urinary retention Outcome: Progressing   Problem: Pain Managment: Goal: General experience of comfort will improve and/or be controlled Outcome: Progressing   Problem: Safety: Goal: Ability to remain free from injury will improve Outcome: Progressing   Problem: Skin Integrity: Goal: Risk for impaired skin integrity will decrease Outcome: Progressing   Problem: Education: Goal: Knowledge of cardiac device and self-care will improve Outcome: Progressing Goal: Ability to safely manage health related needs after discharge will improve Outcome: Progressing Goal: Individualized Educational Video(s) Outcome: Progressing   Problem: Cardiac: Goal: Ability to achieve and maintain adequate cardiopulmonary perfusion will improve Outcome: Progressing

## 2024-06-18 NOTE — Progress Notes (Signed)
 PHARMACY - ANTICOAGULATION CONSULT NOTE  Pharmacy Consult for heparin  Indication: atrial fibrillation  No Known Allergies  Patient Measurements: Height: 6' (182.9 cm) Weight: (!) 145.9 kg (321 lb 10.4 oz) IBW/kg (Calculated) : 77.6 HEPARIN  DW (KG): 112.1  Vital Signs: Temp: 97.9 F (36.6 C) (06/26 0756) Temp Source: Oral (06/26 0756) BP: 136/73 (06/26 0756) Pulse Rate: 34 (06/26 0756)  Labs: Recent Labs    06/15/24 1906 06/15/24 2117 06/16/24 0201 06/17/24 0305 06/17/24 1706 06/18/24 0751  HGB 15.8  --  16.0  --   --  14.8  HCT 47.0  --  47.3  --   --  43.1  PLT 95*  --  110*  --   --  80*  APTT  --   --   --  78* 74* 79*  HEPARINUNFRC  --   --   --  0.86*  --  0.37  CREATININE 1.73*  --  1.62* 1.54*  --  1.34*  TROPONINIHS 27* 29*  --   --   --   --     Estimated Creatinine Clearance: 80.5 mL/min (A) (by C-G formula based on SCr of 1.34 mg/dL (H)).   Medical History: Past Medical History:  Diagnosis Date   A-fib [facility])    Arthritis    Cellulitis and abscess of leg 2009   CHF (congestive heart failure) (HCC)    Dupuytren's disease    Dyspnea    at times   Dysrhythmia    External hemorrhoids    Gout    HTN (hypertension)    Hyperlipidemia    Hyperuricemia    Obstructive sleep apnea    cpap   Pneumonia    Polysubstance abuse (HCC)    Toenail fungus    Type 2 diabetes mellitus (HCC)    type 2, diet controlled after gastric band surgery    Assessment: 67 yom presenting with bradycardia found to have evidence of complete heart block. Was on Xarelto  PTA for hx Afib (LD 6/23 AM) - started on heparin  infusion with need for ICD vs PPM.  Heparin  level and aPTT both therapeutic and correlating, will D/C aPTT checks. H/H ok, pltc down but similar to admit.  Goal of Therapy:  Heparin  level 0.3-0.7 units/ml aPTT 66-102 seconds Monitor platelets by anticoagulation protocol: Yes   Plan:  Continue heparin  infusion at 1550 units/hr  Daily heparin  level and  CBC  Marco James, PharmD, BCPS, [facility] Clinical Pharmacist [phone] Please check AMION for all [facility] Pharmacy numbers 06/18/2024

## 2024-06-18 NOTE — Interval H&P Note (Signed)
 History and Physical Interval Note:  06/18/2024 4:51 PM  Marco James  has presented today for surgery, with the diagnosis of heart block.  The various methods of treatment have been discussed with the patient and family. After consideration of risks, benefits and other options for treatment, the patient has consented to  Procedure(s): BIV ICD INSERTION CRT-D (N/A) as a surgical intervention.  The patient's history has been reviewed, patient examined, no change in status, stable for surgery.  I have reviewed the patient's chart and labs.  Questions were answered to the patient's satisfaction.    PET CT suggests cardiac sarcoidosis. We will plan to proceed with ICD, as patient requires pacing for CHB. Given the presence of cardiac sarcoid, extensive LGE, I think CRT is warranted due to the high likelihood of developing cardiomyopathy, particularly with anticipated high burden of RV pacing. If feasible, we will place CS lead for improved pacing synchrony.  Marco James E Ajahni Nay

## 2024-06-19 ENCOUNTER — Encounter (HOSPITAL_COMMUNITY): Payer: Self-pay | Admitting: Cardiovascular Disease

## 2024-06-19 ENCOUNTER — Inpatient Hospital Stay (HOSPITAL_COMMUNITY)

## 2024-06-19 DIAGNOSIS — R001 Bradycardia, unspecified: Secondary | ICD-10-CM | POA: Diagnosis not present

## 2024-06-19 MED ORDER — TORSEMIDE 20 MG PO TABS: 40.0000 mg | ORAL_TABLET | Freq: Every day | ORAL | Status: AC | PRN

## 2024-06-19 MED ORDER — RIVAROXABAN 20 MG PO TABS
20.0000 mg | ORAL_TABLET | Freq: Every day | ORAL | 6 refills | Status: AC
Start: 2024-06-24 — End: ?

## 2024-06-19 NOTE — Discharge Instructions (Addendum)
 After Your ICD (Implantable Cardiac Defibrillator)   You have a [employer] ICD  ACTIVITY Do not lift your arm above shoulder height for 1 week after your procedure. After 7 days, you may progress as below.  You should remove your sling 24 hours after your procedure, unless otherwise instructed by your provider.     Friday June 26, 2024  Saturday June 27, 2024 Sunday June 28, 2024 Monday June 29, 2024   Do not lift, push, pull, or carry anything over 10 pounds with the affected arm until 6 weeks (Friday July 31, 2024 ) after your procedure.   You may drive AFTER your wound check in 2 weeks   Ask your healthcare provider when you can go back to work   INCISION/Dressing HOLD your Xarelto  post procedure. OK to resume on 06/24/24  If large square, outer bandage is left in place, this can be removed after 24 hours from your procedure. Do not remove steri-strips or glue as below.   Monitor your defibrillator site for redness, swelling, and drainage. Call the device clinic at [phone] if you experience these symptoms or fever/chills.  If your incision is sealed with Steri-strips or staples, you may shower 7 days after your procedure or when told by your provider. Do not remove the steri-strips or let the shower hit directly on your site. You may wash around your site with soap and water.    If you were discharged in a sling, please do not wear this during the day more than 48 hours after your surgery unless otherwise instructed. This may increase the risk of stiffness and soreness in your shoulder.   Avoid lotions, ointments, or perfumes over your incision until it is well-healed.  You may use a hot tub or a pool AFTER your wound check appointment if the incision is completely closed.  Your ICD is designed to protect you from life threatening heart rhythms. Because of this, you may receive a shock.   1 shock with no symptoms:  Call the office during business hours. 1 shock with symptoms  (chest pain, chest pressure, dizziness, lightheadedness, shortness of breath, overall feeling unwell):  Call 911. If you experience 2 or more shocks in 24 hours:  Call 911. If you receive a shock, you should not drive for 6 months per the  DMV IF you receive appropriate therapy from your ICD.   ICD Alerts:  Some alerts are vibratory and others beep. These are NOT emergencies. Please call our office to let us  know. If this occurs at night or on weekends, it can wait until the next business day. Send a remote transmission.  If your device is capable of reading fluid status (for heart failure), you will be offered monthly monitoring to review this with you.   DEVICE MANAGEMENT Remote monitoring is used to monitor your ICD from home. This monitoring is scheduled every 91 days by our office. It allows us  to keep an eye on the functioning of your device to ensure it is working properly. You will routinely see your Electrophysiologist annually (more often if necessary).   You should receive your ID card for your new device in 4-8 weeks. Keep this card with you at all times once received. Consider wearing a medical alert bracelet or necklace.  Your ICD  may be MRI compatible. This will be discussed at your next office visit/wound check.  You should avoid contact with strong electric or magnetic fields.   Do not use amateur (ham) radio  equipment or electric (arc) welding torches. MP3 player headphones with magnets should not be used. Some devices are safe to use if held at least 12 inches (30 cm) from your defibrillator. These include power tools, lawn mowers, and speakers. If you are unsure if something is safe to use, ask your health care provider.  When using your cell phone, hold it to the ear that is on the opposite side from the defibrillator. Do not leave your cell phone in a pocket over the defibrillator.  You may safely use electric blankets, heating pads, computers, and microwave ovens.  Call  the office right away if: You have chest pain. You feel more than one shock. You feel more short of breath than you have felt before. You feel more light-headed than you have felt before. Your incision starts to open up.  This information is not intended to replace advice given to you by your health care provider. Make sure you discuss any questions you have with your health care provider.

## 2024-06-19 NOTE — TOC Transition Note (Signed)
 Transition of Care [facility]) - Discharge Note   Patient Details  Name: Marco James MRN: [id number] Date of Birth: 13-Sep-1956  Transition of Care [facility]) CM/SW Contact:  Waddell Barnie Rama, RN Phone Number: 06/19/2024, 10:50 AM   Clinical Narrative:    For dc today, has no needs.         Patient Goals and CMS Choice            Discharge Placement                       Discharge Plan and Services Additional resources added to the After Visit Summary for                                       Social Drivers of Health (SDOH) Interventions SDOH Screenings   Food Insecurity: Patient Declined (06/16/2024)  Housing: Unknown (06/16/2024)  Transportation Needs: Patient Declined (06/16/2024)  Utilities: Patient Declined (06/16/2024)  Financial Resource Strain: High Risk (06/11/2022)   Received from [facility]  Physical Activity: Inactive (08/21/2018)  Social Connections: Unknown (06/16/2024)  Tobacco Use: Medium Risk (06/16/2024)     Readmission Risk Interventions    06/17/2024    3:07 PM  Readmission Risk Prevention Plan  Transportation Screening Complete  PCP or Specialist Appt within 5-7 Days Complete  Home Care Screening Complete  Medication Review (RN CM) Complete

## 2024-06-19 NOTE — Care Management Important Message (Signed)
 Important Message  Patient Details  Name: Marco James MRN: [id number] Date of Birth: 1956-11-12   Important Message Given:  Yes - Medicare IM     [name] Sharps 06/19/2024, 12:08 PM

## 2024-06-19 NOTE — Progress Notes (Addendum)
  Patient Name: Marco James [facility] Date of Encounter: 06/19/2024  Primary Cardiologist: None Electrophysiologist: Dr. Nancey  Interval Summary   The patient is doing well today.  Pt states he can not tell a difference but he feels good. He is anxious to go home.    At this time, the patient denies chest pain, shortness of breath, or any new concerns.  Vital Signs    Vitals:   06/18/24 1829 06/18/24 2014 06/19/24 0005 06/19/24 0510  BP: (!) 170/90 (!) 156/78 (!) 159/87 (!) 144/88  Pulse: 60 60 60 60  Resp: 20 19 18 18   Temp: 98.6 F (37 C) 98 F (36.7 C) 98.5 F (36.9 C) 98 F (36.7 C)  TempSrc: Oral Oral Oral Oral  SpO2:  95% 94% 94%  Weight:    (!) 147 kg  Height:       No intake or output data in the 24 hours ending 06/19/24 0747 Filed Weights   06/17/24 0418 06/18/24 0538 06/19/24 0510  Weight: (!) 145.6 kg (!) 145.9 kg (!) 147 kg    Physical Exam    GEN- The patient is well appearing, alert and oriented x 3 today.   Lungs- Clear to ausculation bilaterally, normal work of breathing Cardiac- Regular rate and rhythm, no murmurs, rubs or gallops GI- soft, NT, ND, + BS Extremities- no clubbing or cyanosis. No edema  Telemetry    AF, VP 60 bpm  (personally reviewed)  Hospital Course    Marco James [facility] is a 68 y.o. male with PMH of HTN, HLD, obesity, DM II admitted 06/16/24 for increased shortness of breath, lower extremity swelling and feeling poorly since Jan of 2025. Found to be in AF with slow ventricular response.   Assessment & Plan    Symptomatic Bradycardia s/p Single Chamber ICD  Conduction System Disease: RBBB / CHB  Permanent Atrial Fibrillation with Slow Ventricular Response LVEF on admission 55-60%, RV severely enlarged.  Poor images. No reversible causes identified. AF predates 2015. Consider additional differential of amyloid. cMRI concerning for sarcoidosis.  Special permission received for inpatient cPET > which was consistent with sarcoid.    -pt  will need [facility], have reached out for follow up appt  -arm restrictions, wound care, follow up appts reviewed with patient  -industry check this am wnl   -pt to hold OAC, restart on 7/2  -EP follow up arranged for wound check and post implant     For questions or updates, please contact [facility] Please consult [email or web address] for contact info under     Signed, Daphne Barrack, NP-C, AGACNP-BC [facility] - Electrophysiology  06/19/2024, 10:04 AM

## 2024-06-20 NOTE — Discharge Summary (Signed)
 Physician Discharge Summary   Patient: Marco James MRN: [id number] DOB: 1956/06/30  Admit date:     06/15/2024  Discharge date: 06/19/2024  Discharge Physician: Yetta Blanch  PCP: Barbra Odor, NP  Recommendations at discharge: Follow-up with PCP in 1 week. Repeat CBC, if platelets are still less than 150 he will require a referral to hematology for further explanation. Repeat BMP.  Consider dosing of torsemide. Follow-up with cardiology as recommended. Follow wound care and weight lifting restrictions provided by cardiology.   Follow-up Information     Barbra Odor, NP. Go on 06/25/2024.   Specialty: Nurse Practitioner Why: with BMP lab to look at kidney and electrolytes, with CBC lab to look at blood counts, To discuss blood pressure meds, discuss platelet counts Appt.@11 :15am Contact information: [address] [place] [address] [phone]         Zenaida Morene PARAS, MD Follow up on 06/23/2024.   Specialty: Cardiology Why: Appt at 10:00 AM Contact information: [address] [place] [address] [phone]                Discharge Diagnoses: Principal Problem:   Symptomatic bradycardia Active Problems:   Diastolic CHF, chronic (HCC)   Morbid obesity (HCC)   A-fib (HCC)   Diabetes mellitus type 2, diet-controlled (HCC)   Obstructive apnea   Essential (primary) hypertension   Gout   Elevated troponin   Thrombocytopenia (HCC)   ARF (acute renal failure) [facility])  Hospital Course: PMH of paroxysmal A-fib, OSA, morbid obesity, chronic combined CHF, HTN, type II DM, presented to the hospital with complaints of bradycardia and fatigue. Was initially admitted at [facility].  Transferred here for EP evaluation. Underwent cardiac MRI which showed possible delayed contrast enhancement concerning for possible cardiac sarcoid.  PET scan negative for cardiac sarcoid. Eventually a BiV ICD was placed.  Assessment and  Plan: Symptomatic bradycardia with persistent atrial fibrillation with slow ventricular response and bifascicular block.   Suspected cardiac sarcoid Cardiology consulted. Cardiac MRI concerning for cardiac sarcoid. Nuclear medicine scan negative for any nodules. Cardiology decided to go ahead with placement of a BiV ICD placement. Patient will follow-up with cardiology outpatient for further management of suspected cardiac sarcoid as well as ICD.  Chronic diastolic CHF  Echocardiogram this admission EF 55 to 60%. Volume status is adequate.  Monitor. Patient was on furosemide  which was recently changed to torsemide. Appears to have sustained acute kidney injury in the setting of overdiuresis. Currently given that his heart rate will be improved with the pacemaker anticipating improved cardiac output. Would recommend torsemide as needed only for weight gain of 3 pounds in 1 day or 5 pounds in 1 week at 40 mg dose only. Recommend to follow-up with PCP. Suspect his ideal dose could be 20 mg daily with additional 20 mg as needed.  Elevated troponin Mild elevation without any ACS pattern. Management per cardiology.  Hypertension  Home medication include amlodipine , Cardura , lisinopril . Resuming home dose of home medication although would recommend to ideally switch from amlodipine  to beta-blocker for blood pressure control given that now he has a pacemaker to minimize the volume retention side effect. Recommend further conversation with PCP on a follow-up after a BMP.  Prior history of diabetes mellitus type 2.  Patient reports that he has lost significant weight and currently does not have a diagnosis of type 2 diabetes mellitus. recently was started on Mounjaro.   Hemoglobin A1c is 5.6.    Obstructive  sleep apnea  Continue CPAP nightly and as needed.  AKI. Baseline creatinine 1.1 in February 2025. Trending up to 1.3 in May 2025. On admission creatinine was 1.7 and now trending down  to 1.3. Suspecting combination of cardiorenal hemodynamics as well as third spacing. Anticipate improvement in cardiac output after pacemaker implant. Anticipate resolution/improvement in AKI after that. As above torsemide as needed and resume Lasix .  Repeat BMP in 1 week.  Gout  on allopurinol .  Thrombocytopenia  appears to be chronic.  Although platelet counts were 120 in February. Recommend follow-up with PCP and most likely will require outpatient referral to hematology given the chronicity of thrombocytopenia. No significant bleeding.  But outpatient follow-up with PCP recommended.  May need hematology referral.  Elevated anion gap. Etiology not clear. Currently asymptomatic.  Obesity Class 3 Body mass index is 43.54 kg/m.  Placing the pt at higher risk of poor outcomes.  Consultants:  Electrophysiology/Cardiology   Procedures performed:  Echocardiogram Cardiac MRI Nuclear medicine PET scan  DISCHARGE MEDICATION: Allergies as of 06/19/2024   No Known Allergies      Medication List     STOP taking these medications    furosemide  40 MG tablet Commonly known as: LASIX    potassium chloride  SA 20 MEQ tablet Commonly known as: KLOR-CON  M       TAKE these medications    allopurinol  300 MG tablet Commonly known as: ZYLOPRIM  Take 300 mg by mouth daily.   amLODipine  10 MG tablet Commonly known as: NORVASC  Take 10 mg by mouth daily.   colchicine 0.6 MG tablet Take 0.6-1.2 mg by mouth daily.   doxazosin  8 MG tablet Commonly known as: CARDURA  Take 2 tablets (16 mg total) by mouth daily. What changed: how much to take   lisinopril  40 MG tablet Commonly known as: ZESTRIL  Take 1 tablet (40 mg total) by mouth daily. Please call to schedule office visit for further refills. Thank you!   rivaroxaban  20 MG Tabs tablet Commonly known as: Xarelto  Take 1 tablet (20 mg total) by mouth daily with supper. Start taking on: June 24, 2024 What changed:  See the new  instructions. These instructions start on June 24, 2024. If you are unsure what to do until then, ask your doctor or other care provider.   tirzepatide 2.5 MG/0.5ML Pen Commonly known as: MOUNJARO Inject 2.5 mg into the skin once a week. Thursday   torsemide 20 MG tablet Commonly known as: DEMADEX Take 2 tablets (40 mg total) by mouth daily as needed (for weight gain of 3 lbs in 1 day or 5 lbs in 1 week). What changed:  how much to take when to take this reasons to take this       Disposition: Home Diet recommendation: Cardiac diet  Discharge Exam: Vitals:   06/18/24 2014 06/19/24 0005 06/19/24 0510 06/19/24 0755  BP: (!) 156/78 (!) 159/87 (!) 144/88 (!) 155/70  Pulse: 60 60 60 60  Resp: 19 18 18 18   Temp: 98 F (36.7 C) 98.5 F (36.9 C) 98 F (36.7 C) 97.8 F (36.6 C)  TempSrc: Oral Oral Oral Oral  SpO2: 95% 94% 94% 95%  Weight:   (!) 147 kg   Height:       General: Appear in mild distress; no visible Abnormal Neck Mass Or lumps, Conjunctiva normal Cardiovascular: S1 and S2 Present, no Murmur, Respiratory: good respiratory effort, Bilateral Air entry present and CTA, no Crackles, no wheezes Abdomen: Bowel Sound present, Non tender  Extremities: no  Pedal edema Neurology: alert and oriented to time, place, and person  [facility] Acute Care Weights   06/17/24 0418 06/18/24 0538 06/19/24 0510  Weight: (!) 145.6 kg (!) 145.9 kg (!) 147 kg   Condition at discharge: stable  The results of significant diagnostics from this hospitalization (including imaging, microbiology, ancillary and laboratory) are listed below for reference.   Imaging Studies: DG Chest 2 View Result Date: 06/19/2024 CLINICAL DATA:  Cardiac device in situ. EXAM: CHEST - 2 VIEW COMPARISON:  June 15, 2024. FINDINGS: Stable cardiomediastinal silhouette. Interval placement of left-sided single lead pacemaker in grossly good position. No pneumothorax. Both lungs are clear. The visualized skeletal structures are  unremarkable. IMPRESSION: Interval placement of left-sided pacemaker in grossly good position. Electronically Signed   By: Lynwood Landy Raddle M.D.   On: 06/19/2024 08:47   EP PPM/ICD IMPLANT Result Date: 06/18/2024 Table formatting from the original result was not included.  SURGEON:  Eulas Furbish, MD    PREPROCEDURE DIAGNOSES:  1. Complete heart block  2. Cardiac sarcoid    POSTPROCEDURE DIAGNOSES:  1. Complete heart block  2. Cardiac sarcoid    PROCEDURES:   1. Implantation of a single chamber ICD  2. Coronary sinus venogram    INTRODUCTION: Cruzito Standre [facility] presents to the EP lab for defibrillator implantation for complete heart block and cardiac sarcoid.    DESCRIPTION OF PROCEDURE:  Informed written consent was obtained and the patient was brought to the electrophysiology lab in the fasting state. The patient was adequately sedated with intravenous Versed , and fentanyl  as outlined in the nursing report.  The patient's left chest was prepped and draped in the usual sterile fashion by the EP lab staff.  The skin overlying the left deltopectoral region was infiltrated with lidocaine  for local analgesia.  An incision was created over the left deltopectoral region.  A left subcutaneous pocket was fashioned using a combination of sharp and blunt dissection immediately superficial to the pectoral fascia.  Electrocautery was used to assure hemostasis. Lead Placement: The left axillary vein was cannulated using modified seldinger techniques.  Through the left axillary vein, an RV ICD lead was advanced to the RV apex and the helix deployed. The lead was secured to the pectoral fascia. Next, the coronary sinus was cannulated with a 135 sheath over a wholey wire. A CS venogram was obtained. This was very difficult due to the large size of the CS and brisk competitive flow within the vessel. I was not able to obtain reliable opacification. In an attempt to better occlude the vessel, I advanced the balloon out further  beyond the sheath, which was extended to its hub. I injected a small amount of contrast and noted dye staining, so I aborted the injection and withdrew the catheter slightly. Because the patient has only a relative indication for CS lead placement at present, with normal EF, I decided it best to allow this small dissection to heal and re-attempt CS lead placement if his EF declines.   Lead parameters are detailed below.  RV Model [id number] Serial # [id number] Amplitude NA - no R waves at 30 bpm Threshold 0.5 V @ 0.5 S Impedence 510 James The pocket was irrigated with copious gentamicin  solution.  The lead was then connected to a pulse generator (model [place] VR, serial [id number]). The pocket was closed in layers of absorbable suture. EBL < 10mL. Steri-strips and a sterile dressing were applied.  During this procedure the patient is administered Versed  and Fentanyl  by a trained  provider under my direct supervision to achieve and maintain moderate conscious sedation.  The patient's heart rate, blood pressure, and oxygen saturation are monitored continuously during the procedure. The period of conscious sedation is 63 minutes, of which I was present face-to-face 100% of this time.    CONCLUSIONS:  1.  Successful single chamber ICD implantation  2.   Possible contained CS lead dissection without apparent sequelae Eulas Furbish, MD Cardiac Electrophysiology    NM PET CT MYOCARDIAL SARCOIDOSIS Result Date: 06/18/2024   Diffuse FDG uptake suggestive of poor prep but there is a clear area of high uptake in basal anteroseptum. Perfusion images are not interpretable on this study, but area of FDG uptake corresponds to location of LGE on cardiac MRI. Findings are suggestive of cardiac sarcoidosis   FDG uptake was observed. FDG uptake was focal. FDG uptake was present in the basal anteroseptal segment(s). LV perfusion is equivocal.   Left ventricular function is abnormal. Global function is mildly reduced. EF: 45%. End diastolic  cavity size is normal. End systolic cavity size is normal.   Coronary calcium was present on the attenuation correction CT images. Severe coronary calcifications were present. Coronary calcifications were present in the left anterior descending artery, left circumflex artery and right coronary artery distribution(s).   FDG uptake findings are consistent with active myocardial inflammation/sarcoidosis.   Electronically signed by Lonni Nanas, MD CLINICAL DATA:  This over-read does not include interpretation of cardiac or coronary anatomy or pathology. The cardiac PET-CT interpretation by the cardiologist is attached. COMPARISON:  None Available. FINDINGS: No suspicious nodules, masses, or infiltrates are identified in the visualized portion of the lungs. No pleural fluid seen. The visualized portions of the mediastinum and chest wall are unremarkable. IMPRESSION: No significant non-cardiac abnormality identified. Electronically Signed   By: Norleen DELENA Kil M.D.   On: 06/18/2024 14:11  MR CARDIAC MORPHOLOGY W WO CONTRAST Result Date: 06/17/2024 CLINICAL DATA:  Cardiac sarcoidosis suspected, further testing EXAM: MR CARDIA MORPHOLOGY WITHOUT AND WITH CONTRAST TECHNIQUE: The patient was scanned on a 1.5 Tesla Siemens magnet. A dedicated cardiac coil was used. Functional imaging was done using TrueFisp sequences. 2,3, and 4 chamber views were done to assess for RWMA's. Modified Simpson's rule using a short axis stack was used to calculate an ejection fraction on a dedicated work [employer]. The patient received 10mL GADAVIST  GADOBUTROL  1 MMOL/ML IV SOLN. After 10 minutes inversion recovery sequences were used to assess for infiltration and scar tissue. Phase contrast velocity encoded images obtained x 2. This examination is tailored for evaluation cardiac anatomy and function and provides very limited assessment of noncardiac structures, which are accordingly not evaluated during  interpretation. If there is clinical concern for extracardiac pathology, further evaluation with CT imaging should be considered. FINDINGS: LEFT VENTRICLE: Left ventricular chamber size: Upper limit of normal, LVEDD 58 mm. Left ventricular wall thickness: Mild LVH, 13 mm. Left ventricular systolic function: Low normal LVEF visually appears to be 50-55%. There are no regional wall motion abnormalities. No myocardial edema, T2 = 52 msec Normal first pass perfusion. There is post contrast delayed myocardial enhancement. While LGE is not optimally visualized, there appears to be mid papillary muscle LGE, and subendocardial with patchy midmyocardial LGE in the lateral wall base and mid. Normal T1 myocardial nulling kinetics suggest against a diagnosis of cardiac amyloidosis. ECV = 33%, nonspecific elevation RIGHT VENTRICLE: Mildly dilated right ventricular chamber size by visual estimate and estimated volumes. By indexed volumes, though approximate, RV  size may be grossly normal. Normal right ventricular wall thickness. RV function poorly assessed due to arrhythmia in all views. No post contrast delayed myocardial enhancement. ATRIA: Left atrium: Massively dilated Right atrium: Mildly dilated. PERICARDIUM: Normal pericardium.  Small pericardial effusion. OTHER: No significant extracardiac findings. MEASUREMENTS: Measurements performed but not reported due to artifact from arrhythmia IMPRESSION: 1.  Suboptimal quality study due to arrhythmia. 2. Grossly normal LV function, visually appears 50-55%. There is post contrast delayed myocardial enhancement. While LGE is not optimally visualized, there appears to be papillary muscle LGE at the base, and subendocardial with patchy midmyocardial LGE in the lateral wall base and mid. There is also a midwall septal scar at the LV base and mid. There is RV insertion point LGE anterior and inferior, which may indicate elevated pulmonary pressures. These finding in combination are  nonspecific. With multiple areas of LGE, consider cardiac sarcoidosis. Cannot exclude lateral wall infarct with presence of subendocardial lateral wall LGE. 3. RV poorly assessed. Likely borderline RV dilation by estimated volumes. 4.  Massive LA dilation. 5.  Small pericardial effusion without pericardial enhancement. Electronically Signed   By: Soyla Merck M.D.   On: 06/17/2024 14:43   ECHOCARDIOGRAM COMPLETE Result Date: 06/16/2024    ECHOCARDIOGRAM REPORT   Patient Name:   Marco James [facility] James Date of Exam: 06/16/2024 Medical Rec #:  [id number]      Height:       72.0 in Accession #:    [id number]     Weight:       325.0 lb Date of Birth:  1956/02/13     BSA:          2.619 m Patient Age:    67 years       BP:           116/76 mmHg Patient Gender: M              HR:           32 bpm. Exam Location:  Inpatient Procedure: 2D Echo, Cardiac Doppler, Color Doppler and Intracardiac            Opacification Agent (Both Spectral and Color Flow Doppler were            utilized during procedure). Indications:    CHF  History:        Patient has no prior history of Echocardiogram examinations.                 CHF, Arrythmias:Atrial Fibrillation; Risk Factors:Sleep Apnea,                 Former Smoker, Dyslipidemia and Diabetes.  Sonographer:    Therisa Crouch Referring Phys: [phone] ARSHAD N KAKRAKANDY IMPRESSIONS  1. Left ventricular ejection fraction, by estimation, is 55 to 60%. The left ventricle has normal function. Left ventricular endocardial border not optimally defined to evaluate regional wall motion. There is mild concentric left ventricular hypertrophy. Left ventricular diastolic parameters are indeterminate.  2. Right ventricular systolic function is normal. The right ventricular size is severely enlarged.  3. The mitral valve was not well visualized. No evidence of mitral valve regurgitation.  4. The aortic valve was not well visualized. Aortic valve regurgitation is not visualized. No aortic stenosis is present.   5. The inferior vena cava is normal in size with greater than 50% respiratory variability, suggesting right atrial pressure of 3 mmHg.  6. This is a technically difficult study despite the use of contrast. Comparison(s):  No prior Echocardiogram. FINDINGS  Left Ventricle: Left ventricular ejection fraction, by estimation, is 55 to 60%. The left ventricle has normal function. Left ventricular endocardial border not optimally defined to evaluate regional wall motion. The left ventricular internal cavity size was normal in size. There is mild concentric left ventricular hypertrophy. Left ventricular diastolic parameters are indeterminate. Right Ventricle: The right ventricular size is severely enlarged. Right vetricular wall thickness was not well visualized. Right ventricular systolic function is normal. Left Atrium: Left atrial size was not well visualized. Right Atrium: Right atrial size was not well visualized. Pericardium: There is no evidence of pericardial effusion. Presence of epicardial fat layer. Mitral Valve: The mitral valve was not well visualized. Mild mitral annular calcification. No evidence of mitral valve regurgitation. Tricuspid Valve: The tricuspid valve is not well visualized. Tricuspid valve regurgitation is not demonstrated. Aortic Valve: The aortic valve was not well visualized. Aortic valve regurgitation is not visualized. No aortic stenosis is present. Pulmonic Valve: The pulmonic valve was not well visualized. Pulmonic valve regurgitation is not visualized. Aorta: The aortic root and ascending aorta are structurally normal, with no evidence of dilitation. Venous: The inferior vena cava is normal in size with greater than 50% respiratory variability, suggesting right atrial pressure of 3 mmHg. IAS/Shunts: The interatrial septum was not well visualized.  LEFT VENTRICLE PLAX 2D LVIDd:         5.80 cm LVIDs:         4.40 cm LV PW:         1.10 cm LV IVS:        1.20 cm LVOT diam:     2.30 cm LVOT  Area:     4.15 cm  IVC IVC diam: 2.90 cm LEFT ATRIUM            Index LA diam:      6.10 cm  2.33 cm/m LA Vol (A4C): 138.0 ml 52.70 ml/m   AORTA Ao Root diam: 3.10 cm Ao Asc diam:  3.70 cm  SHUNTS Systemic Diam: 2.30 cm Marco Leavens MD Electronically signed by Marco Leavens MD Signature Date/Time: 06/16/2024/9:41:47 AM    Final    DG Chest 1 View Result Date: 06/15/2024 CLINICAL DATA:  Chest pain and bradycardia EXAM: CHEST  1 VIEW COMPARISON:  Chest radiograph dated 01/03/2017 FINDINGS: Normal lung volumes. No focal consolidations. No pleural effusion or pneumothorax. Similar enlarged cardiomediastinal silhouette. No acute osseous abnormality. IMPRESSION: 1.  No focal consolidations. 2. Similar cardiomegaly. Electronically Signed   By: Limin  Xu M.D.   On: 06/15/2024 19:48    Microbiology: Results for orders placed or performed in visit on 09/09/18  Urine Culture     Status: Abnormal   Collection Time: 09/09/18 12:19 PM   Specimen: Urine  Result Value Ref Range Status   MICRO NUMBER: [id number]  Final   SPECIMEN QUALITY: ADEQUATE  Final   Sample Source URINE  Final   STATUS: FINAL  Final   ISOLATE 1: Enterobacter aerogenes (A)  Final    Comment: 10,000-50,000 CFU/mL of Klebsiella aerogenes (Enterobacter)      Susceptibility   Klebsiella aerogenes - URINE CULTURE, REFLEX    AMOX/CLAVULANIC  Resistant     CEFAZOLIN *  Resistant      * For uncomplicated UTI caused by E. coli,K. pneumoniae or P. mirabilis: Cefazolin  issusceptible if MIC <32 mcg/mL and predictssusceptible to the oral agents cefaclor, cefdinir ,cefpodoxime, cefprozil, cefuroxime , cephalexinand loracarbef.    CEFEPIME <=1 Sensitive     CEFTRIAXONE <=1  Sensitive     CIPROFLOXACIN <=0.25 Sensitive     LEVOFLOXACIN  <=0.12 Sensitive     ERTAPENEM <=0.5 Sensitive     GENTAMICIN  <=1 Sensitive     IMIPENEM <=0.25 Sensitive     NITROFURANTOIN 64 Intermediate     PIP/TAZO <=4 Sensitive     TOBRAMYCIN <=1 Sensitive      TRIMETH/SULFA* <=20 Sensitive      * For uncomplicated UTI caused by E. coli,K. pneumoniae or P. mirabilis: Cefazolin  issusceptible if MIC <32 mcg/mL and predictssusceptible to the oral agents cefaclor, cefdinir ,cefpodoxime, cefprozil, cefuroxime , cephalexinand loracarbef.Legend:S = Susceptible  I = IntermediateR = Resistant  NS = Not susceptible* = Not tested  NR = Not reported**NN = See antimicrobic comments   Labs: CBC: Recent Labs  Lab 06/15/24 1906 06/16/24 0201 06/18/24 0751  WBC 7.0 6.4 5.5  NEUTROABS  --  3.6  --   HGB 15.8 16.0 14.8  HCT 47.0 47.3 43.1  MCV 94.6 93.3 92.3  PLT 95* 110* 80*   Basic Metabolic Panel: Recent Labs  Lab 06/15/24 1853 06/15/24 1906 06/16/24 0201 06/17/24 0305 06/18/24 0751  NA  --  135 136 136 141  K  --  4.2 3.8 3.7 3.9  CL  --  100 101 104 101  CO2  --  22 25 26  21*  GLUCOSE  --  82 93 91 88  BUN  --  41* 40* 38* 26*  CREATININE  --  1.73* 1.62* 1.54* 1.34*  CALCIUM  --  8.7* 8.8* 8.7* 9.5  MG 2.2  --  2.2  --   --    Liver Function Tests: Recent Labs  Lab 06/16/24 0201  AST 22  ALT 14  ALKPHOS 36*  BILITOT 1.1  PROT 6.8  ALBUMIN  4.0   CBG: Recent Labs  Lab 06/16/24 0803 06/16/24 1530 06/16/24 2111 06/17/24 0551 06/17/24 1215  GLUCAP 84 116* 97 80 94    Discharge time spent: greater than 30 minutes.  Author: Yetta Blanch, MD  [facility] 06/19/2024

## 2024-06-23 ENCOUNTER — Ambulatory Visit (HOSPITAL_COMMUNITY)
Admission: RE | Admit: 2024-06-23 | Discharge: 2024-06-23 | Disposition: A | Discharge status: Home / Self Care | Source: Ambulatory Visit | Attending: Cardiology | Admitting: Cardiology

## 2024-06-23 ENCOUNTER — Encounter (HOSPITAL_COMMUNITY): Payer: Self-pay | Admitting: Cardiology

## 2024-06-23 VITALS — BP 130/70 | HR 60

## 2024-06-23 DIAGNOSIS — Z7901 Long term (current) use of anticoagulants: Secondary | ICD-10-CM | POA: Insufficient documentation

## 2024-06-23 DIAGNOSIS — E119 Type 2 diabetes mellitus without complications: Secondary | ICD-10-CM | POA: Insufficient documentation

## 2024-06-23 DIAGNOSIS — I5032 Chronic diastolic (congestive) heart failure: Secondary | ICD-10-CM | POA: Diagnosis present

## 2024-06-23 DIAGNOSIS — D696 Thrombocytopenia, unspecified: Secondary | ICD-10-CM | POA: Insufficient documentation

## 2024-06-23 DIAGNOSIS — D8685 Sarcoid myocarditis: Secondary | ICD-10-CM | POA: Diagnosis not present

## 2024-06-23 DIAGNOSIS — Z95810 Presence of automatic (implantable) cardiac defibrillator: Secondary | ICD-10-CM | POA: Insufficient documentation

## 2024-06-23 DIAGNOSIS — Z79899 Other long term (current) drug therapy: Secondary | ICD-10-CM | POA: Insufficient documentation

## 2024-06-23 DIAGNOSIS — I4891 Unspecified atrial fibrillation: Secondary | ICD-10-CM | POA: Insufficient documentation

## 2024-06-23 DIAGNOSIS — R9431 Abnormal electrocardiogram [ECG] [EKG]: Secondary | ICD-10-CM | POA: Insufficient documentation

## 2024-06-23 NOTE — Patient Instructions (Signed)
 Great to see you today!!!  Follow-Up in:   Please follow up with our heart failure pharmacist in 1 month  Your physician recommends that you schedule a follow-up appointment in: 3 months with Dr Zenaida    At the [facility], you and your health needs are our priority. We have a designated team specialized in the treatment of Heart Failure. This Care Team includes your primary Heart Failure Specialized Cardiologist (physician), Advanced Practice Providers (APPs- Physician Assistants and Nurse Practitioners), and Pharmacist who all work together to provide you with the care you need, when you need it.   You may see any of the following providers on your designated Care Team at your next follow up:  Dr. Toribio Fuel Dr. Ezra Shuck Dr. Ria Commander Dr. Odis Zenaida Greig Mosses, NP Caffie Shed, [place] [facility] [place], [place] Beckey Coe, NP [place] Lee, NP Tinnie Redman, PharmD   Please be sure to bring in all your medications bottles to every appointment.   Need to Contact Us :  If you have any questions or concerns before your next appointment please send us  a message through [facility] or call our office at [phone].    TO LEAVE A MESSAGE FOR THE NURSE SELECT OPTION 2, PLEASE LEAVE A MESSAGE INCLUDING: YOUR NAME DATE OF BIRTH CALL BACK NUMBER REASON FOR CALL**this is important as we prioritize the call backs  YOU WILL RECEIVE A CALL BACK THE SAME DAY AS LONG AS YOU CALL BEFORE 4:00 PM

## 2024-06-23 NOTE — Progress Notes (Signed)
 ADVANCED HEART FAILURE NEW PATIENT CLINIC NOTE  Referring Physician: Barbra Odor, NP  Primary Care: Barbra Odor, NP Primary Cardiologist:  HPI: Marco James is a 68 y.o. male with a PMH of CHB, HFpEF, obesitgy, diabetes, atrial fibrilation who presents for initial visit for further evaluation and treatment of cardiac sarcoid.     Patient was recently admitted in 05/2024 with symptoms of fatigue, volume overload, and shortness of breath. He had been followed by [facility] and has been on escalating doses of diuretics with minimal improvement in his symptoms. Had an episode of bradycardia in 2019 that was chalked up to beta blockers. History of atrial fibrillation as well. Underwent inpatient workup for cardiac sarcoid, positive PET scan as well as MRI showing diffuse LGE. Underwent attempted CRT, but large coronary sinus so single chamber ICD placed.      SUBJECTIVE: Overall feeling fair, was not pleased with the prolonged hospital stay but is feeling better since discharge. We discussed his recent diagnosis of cardiac sarcoidosis, has had a previous shingle shot in the past. He denies any family history of sarcoid, does not have any history of ocular involvement, and is otherwise without any other organ system invovlement. No known family history of cardiac disease.   PMH, current medications, allergies, social history, and family history reviewed in epic.  PHYSICAL EXAM: Vitals:   06/23/24 1008  BP: 130/70  Pulse: 60  SpO2: 94%   GENERAL: Well nourished and in no apparent distress at rest.  PULM:  Normal work of breathing, clear to auscultation bilaterally. Respirations are unlabored.  CARDIAC:  JVP: flat         Normal rate with regular rhythm. No murmurs, rubs or gallops.  1+ edema. Warm and well perfused extremities. ABDOMEN: Soft, non-tender, non-distended. NEUROLOGIC: Patient is oriented x3 with no focal or lateralizing neurologic deficits.    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 Does have a history of CHB and HFpEF, no coronary evaluation noted. Discussed with Dr. Nancey, would like to wait 4-5 weeks prior to starting steroids given recent ICD placement. Discussed with pharmacy team, will bring back in early August to start treatment. - Mycophenolate given relative thrombocytopenia, toxicity of methotrexate - Vaccine history discussed - Will defer steroids until follow up - Appreciate pharmacy assistance - Follow up PET to be ordered after starting steroids in anticipation of three month mark  HFpEF: Suspect related to above, though may also have a HTN component, CAD not ruled out.  - Continue amlodipine  10mg  daily - Continue lisinopril  40mg  daily - Continue torsemide  40mg  daily prn - Continue cardura  16mg  daily  Atrial fibrillation: Suspected permanent, now paced.  - Continue rivaroxaban  20mg  daily, restart 7/2 - No clear attempts at rhythm control - needs sleep study  I spent 70 minutes caring for this patient today including face  to face time, ordering and reviewing labs, reviewing records from recent hospitalization, care at [facility], discussion with pharmacy and EP teams, seeing the patient, documenting in the record, and arranging follow ups.    Follow up in 2-3  months  Morene Brownie, MD Advanced Heart Failure Mechanical Circulatory Support 06/27/24

## 2024-06-30 ENCOUNTER — Ambulatory Visit: Attending: Cardiology | Admitting: Cardiology

## 2024-06-30 DIAGNOSIS — D8685 Sarcoid myocarditis: Secondary | ICD-10-CM | POA: Diagnosis not present

## 2024-06-30 DIAGNOSIS — I5032 Chronic diastolic (congestive) heart failure: Secondary | ICD-10-CM

## 2024-06-30 LAB — CUP PACEART INCLINIC DEVICE CHECK
Battery Remaining Longevity: 117 mo
Brady Statistic RV Percent Paced: 95 %
Date Time Interrogation Session: 20250708165044
HighPow Impedance: 63 Ohm
Implantable Lead Connection Status: 753985
Implantable Lead Implant Date: 20250626
Implantable Lead Location: 753860
Implantable Pulse Generator Implant Date: 20250626
Lead Channel Impedance Value: 525 Ohm
Lead Channel Pacing Threshold Amplitude: 0.75 V
Lead Channel Pacing Threshold Amplitude: 0.75 V
Lead Channel Pacing Threshold Pulse Width: 0.5 ms
Lead Channel Pacing Threshold Pulse Width: 0.5 ms
Lead Channel Sensing Intrinsic Amplitude: 12 mV
Lead Channel Setting Pacing Amplitude: 0.75 V
Lead Channel Setting Pacing Pulse Width: 0.5 ms
Lead Channel Setting Sensing Sensitivity: 0.5 mV
Pulse Gen Serial Number: 810102862
Zone Setting Status: 755011

## 2024-06-30 NOTE — Patient Instructions (Addendum)
OK to shower Let warm soapy water run down incision Do not scrub incision Pat dry with towel  Keep incision open to air - no ointments, creams, salves, or bandages.  Call device clinic if incision opens, has drainage, or begins to hurt more.

## 2024-06-30 NOTE — Progress Notes (Signed)
 Wound check appointment.  Steri-strips removed. Wound without redness or edema. Incision edges approximated, wound well healed. Normal device function. Thresholds, sensing, and impedances consistent with implant measurements. Device programmed at auto capture until 3 month visit. Histogram distribution appropriate for patient and level of activity. No mode switches or high ventricular rates noted. Patient educated about wound care, arm mobility, lifting restrictions. ROV in 3 months with EP APP

## 2024-07-06 ENCOUNTER — Ambulatory Visit: Payer: Self-pay | Admitting: Cardiovascular Disease

## 2024-07-06 NOTE — Progress Notes (Unsigned)
 Cardiology Office Note  Date:  07/07/2024   ID:  QUANTA ROHER, DOB 05-25-1956, MRN [id number]  PCP:  Barbra Odor, NP   Chief Complaint  Patient presents with   New Patient (Initial Visit)    Patient was at [facility] in [place] and had a Defib/Pacemaker implant on 06/15/2024.  Doing well.     HPI:  Marco James is a 68 year old gentleman with  morbid obesity, gastric bypass surgery 2018 hypertension,  diabetes type 2,  Prior smoking history, stopped 2016 chronic lower extremity swelling, previously on high-dose diuretics for symptomatic relief chronic atrial fibrillation dating back to before 2010 OSA on CPAP Chronic low potassium Chronic renal insufficiency who follows up for his permanent atrial fibrillation and lower extremity edema  Last seen by myself in clinic December 2019 In the hospital symptomatic bradycardia fatigue June 2025 cardiac MRI which showed possible delayed contrast enhancement concerning for possible cardiac sarcoid.  PET scan positive for cardiac sarcoid a BiV ICD was placed.  Echo June 2025 with ejection fraction 55 to 60% RV severely enlarged by report Torsemide  as needed Denies significant leg swelling  Blood pressure low today and is well at home 100-108 systolic, on recheck  Remains on amlodipine  10, Cardura  8 mg twice daily, lisinopril  40  Seen by CHF clinic June 23, 2024  Lab work reviewed Creatinine 1.62  Reports dramatic weight loss over the past several years  EKG personally reviewed by myself on todays visit EKG Interpretation Date/Time:  Tuesday July 07 2024 14:25:30 EDT Ventricular Rate:  58 PR Interval:    QRS Duration:  198 QT Interval:  502 QTC Calculation: 492 R Axis:   -77  Text Interpretation: Ventricular-paced rhythm When compared with ECG of 23-Jun-2024 10:00, Vent. rate has decreased BY   2 BPM Confirmed by Adren Dollins [phone]) on 07/07/2024 2:28:11 PM    PMH:   has a past medical history of A-fib (HCC),  Arthritis, Cellulitis and abscess of leg (2009), CHF (congestive heart failure) (HCC), Dupuytren's disease, Dyspnea, Dysrhythmia, External hemorrhoids, Gout, HTN (hypertension), Hyperlipidemia, Hyperuricemia, Obstructive sleep apnea, Pneumonia, Polysubstance abuse (HCC), Toenail fungus, and Type 2 diabetes mellitus (HCC).  PSH:      Current Outpatient Medications  Medication Sig Dispense Refill   allopurinol  (ZYLOPRIM ) 300 MG tablet Take 300 mg by mouth daily.     amLODipine  (NORVASC ) 10 MG tablet Take 10 mg by mouth daily.     colchicine 0.6 MG tablet Take 0.6-1.2 mg by mouth daily.     doxazosin  (CARDURA ) 8 MG tablet Take 2 tablets (16 mg total) by mouth daily. 180 tablet 3   lisinopril  (ZESTRIL ) 40 MG tablet Take 1 tablet (40 mg total) by mouth daily. Please call to schedule office visit for further refills. Thank you! 30 tablet 0   rivaroxaban  (XARELTO ) 20 MG TABS tablet Take 1 tablet (20 mg total) by mouth daily with supper. 30 tablet 6   tirzepatide (MOUNJARO) 5 MG/0.5ML Pen Inject 5 mg into the skin once a week.     torsemide  (DEMADEX ) 20 MG tablet Take 2 tablets (40 mg total) by mouth daily as needed (for weight gain of 3 lbs in 1 day or 5 lbs in 1 week).     No current facility-administered medications for this visit.     Allergies:   Patient has no known allergies.   Social History:  The patient  reports that he quit smoking about 15 years ago. His smoking use included cigarettes. He started smoking about  40 years ago. He has a 12.5 pack-year smoking history. He quit smokeless tobacco use about 32 years ago. He reports current alcohol use of about 16.0 standard drinks of alcohol per week. He reports current drug use. Drug: Marijuana.   Family History:   family history includes Heart disease (age of onset: 69) in his father.    Review of Systems: Review of Systems  Constitutional:  Positive for weight loss.  Cardiovascular: Negative.   Gastrointestinal: Negative.    Musculoskeletal: Negative.   Neurological: Negative.   Psychiatric/Behavioral: Negative.    All other systems reviewed and are negative.   PHYSICAL EXAM: VS:  BP 100/62 (BP Location: Right Arm, Patient Position: Sitting, Cuff Size: Large)   Pulse (!) 58   Ht 6' (1.829 m)   Wt (!) 319 lb 4 oz (144.8 kg)   SpO2 96%   BMI 43.30 kg/m  , BMI Body mass index is 43.3 kg/m.  Constitutional:  oriented to person, place, and time. No distress.  HENT:  Head: Grossly normal Eyes:  no discharge. No scleral icterus.  Neck: No JVD, no carotid bruits  Cardiovascular: Regular rate and rhythm, no murmurs appreciated Pulmonary/Chest: Clear to auscultation bilaterally, no wheezes or rales Abdominal: Soft.  no distension.  no tenderness.  Musculoskeletal: Normal range of motion Neurological:  normal muscle tone. Coordination normal. No atrophy Skin: Skin warm and dry Psychiatric: normal affect, pleasant   Recent Labs: 06/15/2024: B Natriuretic Peptide 124.5 06/16/2024: ALT 14; Magnesium 2.2; TSH 7.976 06/18/2024: BUN 26; Creatinine, Ser 1.34; Hemoglobin 14.8; Platelets 80; Potassium 3.9; Sodium 141    Lipid Panel Lab Results  Component Value Date   CHOL 195 08/26/2018   HDL 51 08/26/2018   LDLCALC 127 (H) 08/26/2018   TRIG 83 08/26/2018      Wt Readings from Last 3 Encounters:  07/07/24 (!) 319 lb 4 oz (144.8 kg)  06/19/24 (!) 324 lb 1.2 oz (147 kg)  12/16/18 300 lb (136.1 kg)      ASSESSMENT AND PLAN:  Cardiac sarcoid Noted on cardiac MRI and confirmed on PET scan Followed by CHF clinic, reports he is scheduled to start treatment next month  Diastolic CHF, chronic (HCC) Takes torsemide  as needed Will need to monitor potassium, long history hypokalemia  Chronic atrial fibrillation (HCC) On Xarelto  20 daily Paced rhythm  Morbid obesity (HCC) Dramatic weight loss after gastric bypass  Essential (primary) hypertension Blood pressure running low, recommend he decrease  amlodipine  down to 5 daily Continue to monitor pressure Suspect he will need to decrease Cardura  dosing  Pure hypercholesterolemia Currently not on a statin No known coronary disease  Bilateral lower extremity edema Minimal leg swelling on today's visit Previously on compression hose, takes torsemide  as needed Amlodipine  down to 5  Type 2 diabetes mellitus with diabetic neuropathy, unspecified long term insulin  use status (HCC) Hemoglobin A1c less than 5 after weight loss  CKD (chronic kidney disease) stage 3, GFR 30-59 ml/min Renal function insufficiency, has torsemide  as needed    Orders Placed This Encounter  Procedures   EKG 12-Lead     Signed, Marco James, M.D., Ph.D. 07/07/2024  [facility] [place], [place] [phone]

## 2024-07-07 ENCOUNTER — Encounter: Payer: Self-pay | Admitting: Cardiovascular Disease

## 2024-07-07 ENCOUNTER — Ambulatory Visit: Payer: Self-pay | Attending: Cardiovascular Disease | Admitting: Cardiovascular Disease

## 2024-07-07 VITALS — BP 100/62 | HR 58 | Ht 72.0 in | Wt 319.2 lb

## 2024-07-07 DIAGNOSIS — N183 Chronic kidney disease, stage 3 unspecified: Secondary | ICD-10-CM

## 2024-07-07 DIAGNOSIS — R001 Bradycardia, unspecified: Secondary | ICD-10-CM | POA: Diagnosis not present

## 2024-07-07 DIAGNOSIS — I10 Essential (primary) hypertension: Secondary | ICD-10-CM

## 2024-07-07 DIAGNOSIS — I4821 Permanent atrial fibrillation: Secondary | ICD-10-CM | POA: Diagnosis not present

## 2024-07-07 DIAGNOSIS — E782 Mixed hyperlipidemia: Secondary | ICD-10-CM

## 2024-07-07 DIAGNOSIS — I5032 Chronic diastolic (congestive) heart failure: Secondary | ICD-10-CM

## 2024-07-07 DIAGNOSIS — D8685 Sarcoid myocarditis: Secondary | ICD-10-CM

## 2024-07-07 DIAGNOSIS — E119 Type 2 diabetes mellitus without complications: Secondary | ICD-10-CM

## 2024-07-07 MED ORDER — AMLODIPINE BESYLATE 5 MG PO TABS: 5.0000 mg | ORAL_TABLET | Freq: Every day | ORAL | 3 refills | Status: AC

## 2024-07-07 NOTE — Patient Instructions (Addendum)
 Medication Instructions:   Please decrease the amlodipine  down to 5 mg daily  If you need a refill on your cardiac medications before your next appointment, please call your pharmacy.   Lab work: No new labs needed  Testing/Procedures: No new testing needed  Follow-Up: At [facility], you and your health needs are our priority.  As part of our continuing mission to provide you with exceptional heart care, we have created designated Provider Care Teams.  These Care Teams include your primary Cardiologist (physician) and Advanced Practice Providers (APPs -  Physician Assistants and Nurse Practitioners) who all work together to provide you with the care you need, when you need it.  You will need a follow up appointment in 6 months  Providers on your designated Care Team:   Lonni Meager, NP Bernardino Bring, PA-C Cadence [place], [place]  COVID-19 Vaccine Information can be found at: [email or web address] For questions related to vaccine distribution or appointments, please email [email or web address] or call [phone].

## 2024-07-14 NOTE — Progress Notes (Incomplete)
***  In Progress***    Advanced Heart Failure Clinic Note   Referring Physician: Barbra Odor, NP  Primary Care: Barbra Odor, NP HF Cardiologist: Dr. Zenaida  HPI:  Marco James is a 68 y.o. male with a PMH of CHB, HFpEF, obesity, diabetes, atrial fibrilation who presents for follow up treatment of cardiac sarcoid.   Patient was recently admitted in 05/2024 with symptoms of fatigue, volume overload, and shortness of breath. He had been followed by [facility] and has been on escalating doses of diuretics with minimal improvement in his symptoms. Had an episode of bradycardia in 2019 that was chalked up to beta blockers. History of atrial fibrillation as well. Underwent inpatient workup for cardiac sarcoid, positive PET scan as well as MRI showing diffuse LGE. Underwent attempted CRT, but large coronary sinus so single chamber ICD placed.   Presented for initial visit with Dr. Zenaida 06/23/24. Overall feeling fair, was not pleased with the prolonged hospital stay but had been feeling better since discharge. They discussed his recent diagnosis of cardiac sarcoidosis, has had a previous shingles shot in the past. He denied any family history of sarcoid, does not have any history of ocular involvement, and was otherwise without any other organ system involvement. No known family history of cardiac disease   Today he returns to HF clinic for initiation of cardiac sarcoidosis protocol. At last visit with MD new diagnosis of cardiac sarcoidosis was discussed. Treatment was deferred for follow up to allow for healing after ICD placement.       Has the patient been experiencing any side effects to the medications prescribed?  {YES NO:22349}  Does the patient have any problems obtaining medications due to transportation or finances?   {YES NO:22349}  Understanding of regimen: {excellent/good/fair/poor:19665} Understanding of indications: {excellent/good/fair/poor:19665} Potential of compliance:  {excellent/good/fair/poor:19665} Patient understands to avoid NSAIDs. Patient understands to avoid decongestants.    Pertinent Lab Values: Serum creatinine ***, BUN ***, Potassium ***, Sodium ***, BNP ***, Magnesium ***, Digoxin ***   Vital Signs: Weight: *** (last clinic weight: ***) Blood pressure: ***  Heart rate: ***   Assessment/Plan: Cardiac sarcoid: LGE on MRI correlating with area of uptake on cardiac PET, though poor study quality. Does have a history of CHB and HFpEF, no coronary evaluation noted. Discussed with Dr. Nancey, would like to wait 4-5 weeks prior to starting steroids given recent ICD placement.  - Mycophenolate given relative thrombocytopenia, toxicity of methotrexate *** - Follow up PET to be ordered after starting steroids in anticipation of three month mark   HFpEF: Suspect related to above, though may also have a HTN component, CAD not ruled out.  - Continue amlodipine  10 mg daily - Continue lisinopril  40 mg daily - Continue torsemide  40 mg daily prn - Continue doxazosin  16 mg daily   Atrial fibrillation: Suspected permanent, now paced.  - Continue rivaroxaban  20 mg daily - No clear attempts at rhythm control - needs sleep study  Follow up ***   Tinnie Redman, PharmD, BCPS, BCCP, CPP Heart Failure Clinic Pharmacist [phone]

## 2024-07-23 ENCOUNTER — Other Ambulatory Visit (HOSPITAL_COMMUNITY): Payer: Self-pay

## 2024-07-23 DIAGNOSIS — D8685 Sarcoid myocarditis: Secondary | ICD-10-CM

## 2024-07-27 ENCOUNTER — Other Ambulatory Visit (HOSPITAL_COMMUNITY)

## 2024-08-03 ENCOUNTER — Inpatient Hospital Stay

## 2024-08-03 ENCOUNTER — Encounter

## 2024-08-03 ENCOUNTER — Encounter: Payer: Self-pay | Admitting: Oncology

## 2024-08-03 ENCOUNTER — Inpatient Hospital Stay: Attending: Oncology | Admitting: Oncology

## 2024-08-03 VITALS — BP 118/62 | HR 72 | Temp 98.0°F | Resp 16 | Wt 315.0 lb

## 2024-08-03 DIAGNOSIS — D696 Thrombocytopenia, unspecified: Secondary | ICD-10-CM | POA: Insufficient documentation

## 2024-08-03 DIAGNOSIS — Z87891 Personal history of nicotine dependence: Secondary | ICD-10-CM | POA: Diagnosis not present

## 2024-08-03 LAB — CBC (CANCER CENTER ONLY)
HCT: 40.7 % (ref 39.0–52.0)
Hemoglobin: 14.3 g/dL (ref 13.0–17.0)
MCH: 31.5 pg (ref 26.0–34.0)
MCHC: 35.1 g/dL (ref 30.0–36.0)
MCV: 89.6 fL (ref 80.0–100.0)
Platelet Count: 119 K/uL — ABNORMAL LOW (ref 150–400)
RBC: 4.54 MIL/uL (ref 4.22–5.81)
RDW: 14.6 % (ref 11.5–15.5)
WBC Count: 5.6 K/uL (ref 4.0–10.5)
nRBC: 0 % (ref 0.0–0.2)

## 2024-08-03 LAB — IRON AND TIBC
Iron: 125 ug/dL (ref 45–182)
Saturation Ratios: 46 % — ABNORMAL HIGH (ref 17.9–39.5)
TIBC: 274 ug/dL (ref 250–450)
UIBC: 149 ug/dL

## 2024-08-03 LAB — FOLATE: Folate: 16.3 ng/mL (ref >5.9)

## 2024-08-03 LAB — FERRITIN: Ferritin: 210 ng/mL (ref 24–336)

## 2024-08-03 LAB — LACTATE DEHYDROGENASE: LDH: 124 U/L (ref 98–192)

## 2024-08-03 LAB — VITAMIN B12: Vitamin B-12: 933 pg/mL — ABNORMAL HIGH (ref 180–914)

## 2024-08-03 NOTE — Progress Notes (Signed)
 [facility]  Telephone:([phone] Fax:(336) [phone]  ID: Albaro Deviney [facility] OB: [birth year]  MR#: [id number]  RDW#:[id number]  Patient Care Team: Barbra Odor, NP as PCP - General (Nurse Practitioner) Mealor, Eulas BRAVO, MD as PCP - Electrophysiology (Clinical Cardiac Electrophysiology) Perla Evalene PARAS, MD as Consulting Physician (Cardiology) Leni Marjory MATSU, MD as Consulting Physician (Rheumatology) Riddle, Suzann, NP as Nurse Practitioner (Clinical Cardiac Electrophysiology) Jacobo Evalene PARAS, MD as Consulting Physician (Oncology)  CHIEF COMPLAINT: Thrombocytopenia.  INTERVAL HISTORY: Patient is a 68 year old male who was noted to have persistently decreased platelet count on routine blood work.  He currently feels well and is asymptomatic.  He denies any easy bleeding or bruising.  He has no neurologic complaints.  He denies any recent fevers or illnesses.  He has a good appetite and denies weight loss.  He has no chest pain, shortness of breath, cough, or hemoptysis.  He denies any nausea, vomiting, constipation, or diarrhea.  He has no urinary complaints.  Patient offers no specific complaints today.  REVIEW OF SYSTEMS:   Review of Systems  Constitutional: Negative.  Negative for fever, malaise/fatigue and weight loss.  Respiratory: Negative.  Negative for cough, hemoptysis and shortness of breath.   Cardiovascular:  Negative for chest pain and leg swelling.  Gastrointestinal: Negative.  Negative for abdominal pain.  Genitourinary: Negative.  Negative for dysuria.  Musculoskeletal: Negative.  Negative for back pain.  Skin: Negative.  Negative for rash.  Neurological: Negative.  Negative for dizziness, focal weakness, weakness and headaches.  Endo/Heme/Allergies:  Does not bruise/bleed easily.  Psychiatric/Behavioral: Negative.  The patient is not nervous/anxious.     As per HPI. Otherwise, a complete review of systems is negative.  PAST MEDICAL  HISTORY: Past Medical History:  Diagnosis Date   A-fib [facility])    Arthritis    Cellulitis and abscess of leg 2009   CHF (congestive heart failure) (HCC)    Dupuytren's disease    Dyspnea    at times   Dysrhythmia    External hemorrhoids    Gout    HTN (hypertension)    Hyperlipidemia    Hyperuricemia    Obstructive sleep apnea    cpap   Pneumonia    Polysubstance abuse (HCC)    Toenail fungus    Type 2 diabetes mellitus (HCC)    type 2, diet controlled after gastric band surgery    PAST SURGICAL HISTORY: Past Surgical History:  Procedure Laterality Date   BIV ICD INSERTION CRT-D N/A 06/18/2024   Procedure: BIV ICD INSERTION CRT-D;  Surgeon: Nancey Eulas BRAVO, MD;  [facility];  Service: Cardiovascular;  Laterality: N/A;   COLONOSCOPY WITH PROPOFOL  N/A 10/25/2015   Procedure: COLONOSCOPY WITH PROPOFOL ;  Surgeon: Rogelia Copping, MD;  Location: [facility] ENDOSCOPY;  Service: Endoscopy;  Laterality: N/A;   LAPAROSCOPIC GASTRIC SLEEVE RESECTION N/A 11/18/2017   Procedure: LAPAROSCOPIC GASTRIC SLEEVE RESECTION WITH UPPER ENDOSCOPY;  Surgeon: Gladis Cough, MD;  Location: [facility];  Service: General;  Laterality: N/A;   LAPAROSCOPIC GASTRIC SLEEVE RESECTION     11/18/17 Dr. Gladis   NASAL SINUS SURGERY     WISDOM TOOTH EXTRACTION      FAMILY HISTORY: Family History  Problem Relation Age of Onset   Heart disease Father 92    ADVANCED DIRECTIVES (Y/N):  N  HEALTH MAINTENANCE: Social History   Tobacco Use   Smoking status: Former    Current packs/day: 0.00    Average packs/day: 0.5 packs/day for 25.0  years (12.5 ttl pk-yrs)    Types: Cigarettes    Start date: 09/10/1983    Quit date: 09/09/2008    Years since quitting: 15.9   Smokeless tobacco: Former    Quit date: 11/08/1991  Vaping Use   Vaping status: Never Used  Substance Use Topics   Alcohol use: Yes    Alcohol/week: 16.0 standard drinks of alcohol    Types: 16 Shots of liquor per week   Drug use: Yes     Types: Marijuana    Comment: Pt. used cocaine for 3 years, marijuana--long ago     Colonoscopy:  PAP:  Bone density:  Lipid panel:  No Known Allergies  Current Outpatient Medications  Medication Sig Dispense Refill   allopurinol  (ZYLOPRIM ) 300 MG tablet Take 300 mg by mouth daily.     amLODipine  (NORVASC ) 5 MG tablet Take 1 tablet (5 mg total) by mouth daily. 90 tablet 3   colchicine 0.6 MG tablet Take 0.6-1.2 mg by mouth daily.     doxazosin  (CARDURA ) 8 MG tablet Take 2 tablets (16 mg total) by mouth daily. 180 tablet 3   lisinopril  (ZESTRIL ) 40 MG tablet Take 1 tablet (40 mg total) by mouth daily. Please call to schedule office visit for further refills. Thank you! 30 tablet 0   MOUNJARO 7.5 MG/0.5ML Pen SMARTSIG:7.5 Milligram(s) SUB-Q Once a Week     rivaroxaban  (XARELTO ) 20 MG TABS tablet Take 1 tablet (20 mg total) by mouth daily with supper. 30 tablet 6   tirzepatide (MOUNJARO) 5 MG/0.5ML Pen Inject 5 mg into the skin once a week.     torsemide  (DEMADEX ) 20 MG tablet Take 2 tablets (40 mg total) by mouth daily as needed (for weight gain of 3 lbs in 1 day or 5 lbs in 1 week).     No current facility-administered medications for this visit.    OBJECTIVE: Vitals:   08/03/24 1134  BP: 118/62  Pulse: 72  Resp: 16  Temp: 98 F (36.7 C)  SpO2: 97%     Body mass index is 42.72 kg/m.    ECOG FS:0 - Asymptomatic  General: Well-developed, well-nourished, no acute distress. Eyes: Pink conjunctiva, anicteric sclera. HEENT: Normocephalic, moist mucous membranes. Lungs: No audible wheezing or coughing. Heart: Regular rate and rhythm. Abdomen: Soft, nontender, no obvious distention. Musculoskeletal: No edema, cyanosis, or clubbing. Neuro: Alert, answering all questions appropriately. Cranial nerves grossly intact. Skin: No rashes or petechiae noted. Psych: Normal affect. Lymphatics: No cervical, calvicular, axillary or inguinal LAD.   LAB RESULTS:  Lab Results   Component Value Date   NA 141 06/18/2024   K 3.9 06/18/2024   CL 101 06/18/2024   CO2 21 (L) 06/18/2024   GLUCOSE 88 06/18/2024   BUN 26 (H) 06/18/2024   CREATININE 1.34 (H) 06/18/2024   CALCIUM 9.5 06/18/2024   PROT 6.8 06/16/2024   ALBUMIN  4.0 06/16/2024   AST 22 06/16/2024   ALT 14 06/16/2024   ALKPHOS 36 (L) 06/16/2024   BILITOT 1.1 06/16/2024   GFRNONAA 58 (L) 06/18/2024   GFRAA 86 08/26/2018    Lab Results  Component Value Date   WBC 5.6 08/03/2024   NEUTROABS 3.6 06/16/2024   HGB 14.3 08/03/2024   HCT 40.7 08/03/2024   MCV 89.6 08/03/2024   PLT 119 (L) 08/03/2024   Lab Results  Component Value Date   IRON 125 08/03/2024   TIBC 274 08/03/2024   IRONPCTSAT 46 (H) 08/03/2024   Lab Results  Component Value Date  FERRITIN 210 08/03/2024     STUDIES: No results found.  ASSESSMENT: Thrombocytopenia.  PLAN:    Thrombocytopenia: Patient's platelet count is only mildly reduced at 119 today.  Upon review of his chart patient's platelet count has ranged between 80 and 152 September 2017.  Iron stores, B12, folate are all within normal limits.  The remainder of his laboratory work is pending at time of dictation.  No intervention is needed.  Patient does not require bone marrow biopsy.  He will have a video-assisted telemedicine visit in 3 weeks to discuss the results.  I spent a total of 45 minutes reviewing chart data, face-to-face evaluation with the patient, counseling and coordination of care as detailed above.   Patient expressed understanding and was in agreement with this plan. He also understands that He can call clinic at any time with any questions, concerns, or complaints.     Evalene JINNY Reusing, MD   08/04/2024 6:15 AM

## 2024-08-04 LAB — PLATELET ANTIBODY PROFILE
Glycoprotein IV Antibody: NEGATIVE
HLA Ab Ser Ql EIA: NEGATIVE
IA/IIA Antibody: NEGATIVE
IB/IX Antibody: NEGATIVE
IIB/IIIA Antibody: NEGATIVE

## 2024-08-04 LAB — PROTEIN ELECTROPHORESIS, SERUM
A/G Ratio: 1.4 (ref 0.7–1.7)
Albumin ELP: 3.6 g/dL (ref 2.9–4.4)
Alpha-1-Globulin: 0.2 g/dL (ref 0.0–0.4)
Alpha-2-Globulin: 0.5 g/dL (ref 0.4–1.0)
Beta Globulin: 0.8 g/dL (ref 0.7–1.3)
Gamma Globulin: 1 g/dL (ref 0.4–1.8)
Globulin, Total: 2.6 g/dL (ref 2.2–3.9)
Total Protein ELP: 6.2 g/dL (ref 6.0–8.5)

## 2024-08-04 LAB — HAPTOGLOBIN: Haptoglobin: 47 mg/dL (ref 32–363)

## 2024-08-11 ENCOUNTER — Ambulatory Visit
Admission: RE | Admit: 2024-08-11 | Discharge: 2024-08-11 | Disposition: A | Discharge status: Home / Self Care | Source: Ambulatory Visit | Attending: Oncology | Admitting: Oncology

## 2024-08-11 DIAGNOSIS — D696 Thrombocytopenia, unspecified: Secondary | ICD-10-CM | POA: Insufficient documentation

## 2024-08-25 ENCOUNTER — Inpatient Hospital Stay: Attending: Oncology | Admitting: Oncology

## 2024-08-25 DIAGNOSIS — Z87891 Personal history of nicotine dependence: Secondary | ICD-10-CM | POA: Diagnosis not present

## 2024-08-25 DIAGNOSIS — D696 Thrombocytopenia, unspecified: Secondary | ICD-10-CM | POA: Diagnosis not present

## 2024-08-25 NOTE — Progress Notes (Unsigned)
 [facility]  Telephone:(336) [phone] Fax:(336) [phone]  ID: Marco [facility] OB: 1956/08/31  MR#: [id number]  RDW#:[id number]  Patient Care Team: Barbra Odor, NP as PCP - General (Nurse Practitioner) Mealor, Eulas BRAVO, MD as PCP - Electrophysiology (Clinical Cardiac Electrophysiology) Perla Evalene PARAS, MD as Consulting Physician (Cardiology) Leni Marjory MATSU, MD as Consulting Physician (Rheumatology) Riddle, Suzann, NP as Nurse Practitioner (Clinical Cardiac Electrophysiology) Jacobo Evalene PARAS, MD as Consulting Physician (Oncology)  I connected with Abran ORN [facility] on August 25, 2024 at  2:30 PM EDT by video enabled telemedicine visit and verified that I am speaking with the correct person using two identifiers.   I discussed the limitations, risks, security and privacy concerns of performing an evaluation and management service by telemedicine and the availability of in-person appointments. I also discussed with the patient that there may be a patient responsible charge related to this service. The patient expressed understanding and agreed to proceed.   Other persons participating in the visit and their role in the encounter: Patient, MD.  Patient's location: Home. Provider's location: Clinic.  CHIEF COMPLAINT: Thrombocytopenia.  INTERVAL HISTORY: Patient agreed to video-assisted telemedicine visit for further evaluation and discussion of his laboratory results.  He continues to feel well and remains asymptomatic. He denies any easy bleeding or bruising.  He has no neurologic complaints.  He denies any recent fevers or illnesses.  He has a good appetite and denies weight loss.  He has no chest pain, shortness of breath, cough, or hemoptysis.  He denies any nausea, vomiting, constipation, or diarrhea.  He has no urinary complaints.  Patient offers no specific complaints today.  REVIEW OF SYSTEMS:   Review of Systems  Constitutional: Negative.  Negative for  fever, malaise/fatigue and weight loss.  Respiratory: Negative.  Negative for cough, hemoptysis and shortness of breath.   Cardiovascular:  Negative for chest pain and leg swelling.  Gastrointestinal: Negative.  Negative for abdominal pain.  Genitourinary: Negative.  Negative for dysuria.  Musculoskeletal: Negative.  Negative for back pain.  Skin: Negative.  Negative for rash.  Neurological: Negative.  Negative for dizziness, focal weakness, weakness and headaches.  Endo/Heme/Allergies:  Does not bruise/bleed easily.  Psychiatric/Behavioral: Negative.  The patient is not nervous/anxious.     As per HPI. Otherwise, a complete review of systems is negative.  PAST MEDICAL HISTORY: Past Medical History:  Diagnosis Date   A-fib [facility])    Arthritis    Cellulitis and abscess of leg 2009   CHF (congestive heart failure) (HCC)    Dupuytren's disease    Dyspnea    at times   Dysrhythmia    External hemorrhoids    Gout    HTN (hypertension)    Hyperlipidemia    Hyperuricemia    Obstructive sleep apnea    cpap   Pneumonia    Polysubstance abuse (HCC)    Toenail fungus    Type 2 diabetes mellitus (HCC)    type 2, diet controlled after gastric band surgery    PAST SURGICAL HISTORY: Past Surgical History:  Procedure Laterality Date   BIV ICD INSERTION CRT-D N/A 06/18/2024   Procedure: BIV ICD INSERTION CRT-D;  Surgeon: Nancey Eulas BRAVO, MD;  [facility];  Service: Cardiovascular;  Laterality: N/A;   COLONOSCOPY WITH PROPOFOL  N/A 10/25/2015   Procedure: COLONOSCOPY WITH PROPOFOL ;  Surgeon: Rogelia Copping, MD;  Location: [facility] ENDOSCOPY;  Service: Endoscopy;  Laterality: N/A;   LAPAROSCOPIC GASTRIC SLEEVE RESECTION N/A 11/18/2017  Procedure: LAPAROSCOPIC GASTRIC SLEEVE RESECTION WITH UPPER ENDOSCOPY;  Surgeon: Gladis Cough, MD;  Location: [facility];  Service: General;  Laterality: N/A;   LAPAROSCOPIC GASTRIC SLEEVE RESECTION     11/18/17 Dr. Gladis   NASAL SINUS SURGERY      WISDOM TOOTH EXTRACTION      FAMILY HISTORY: Family History  Problem Relation Age of Onset   Heart disease Father 18    ADVANCED DIRECTIVES (Y/N):  N  HEALTH MAINTENANCE: Social History   Tobacco Use   Smoking status: Former    Current packs/day: 0.00    Average packs/day: 0.5 packs/day for 25.0 years (12.5 ttl pk-yrs)    Types: Cigarettes    Start date: 09/10/1983    Quit date: 09/09/2008    Years since quitting: 15.9   Smokeless tobacco: Former    Quit date: 11/08/1991  Vaping Use   Vaping status: Never Used  Substance Use Topics   Alcohol use: Yes    Alcohol/week: 16.0 standard drinks of alcohol    Types: 16 Shots of liquor per week   Drug use: Yes    Types: Marijuana    Comment: Pt. used cocaine for 3 years, marijuana--long ago     Colonoscopy:  PAP:  Bone density:  Lipid panel:  No Known Allergies  Current Outpatient Medications  Medication Sig Dispense Refill   allopurinol  (ZYLOPRIM ) 300 MG tablet Take 300 mg by mouth daily.     amLODipine  (NORVASC ) 5 MG tablet Take 1 tablet (5 mg total) by mouth daily. 90 tablet 3   colchicine 0.6 MG tablet Take 0.6-1.2 mg by mouth daily.     doxazosin  (CARDURA ) 8 MG tablet Take 2 tablets (16 mg total) by mouth daily. 180 tablet 3   lisinopril  (ZESTRIL ) 40 MG tablet Take 1 tablet (40 mg total) by mouth daily. Please call to schedule office visit for further refills. Thank you! 30 tablet 0   MOUNJARO 7.5 MG/0.5ML Pen SMARTSIG:7.5 Milligram(s) SUB-Q Once a Week     rivaroxaban  (XARELTO ) 20 MG TABS tablet Take 1 tablet (20 mg total) by mouth daily with supper. 30 tablet 6   tirzepatide (MOUNJARO) 5 MG/0.5ML Pen Inject 5 mg into the skin once a week.     torsemide  (DEMADEX ) 20 MG tablet Take 2 tablets (40 mg total) by mouth daily as needed (for weight gain of 3 lbs in 1 day or 5 lbs in 1 week).     No current facility-administered medications for this visit.    OBJECTIVE: There were no vitals filed for this visit.     There is no height or weight on file to calculate BMI.    ECOG FS:0 - Asymptomatic  General: Well-developed, well-nourished, no acute distress. HEENT: Normocephalic. Neuro: Alert, answering all questions appropriately. Cranial nerves grossly intact. Psych: Normal affect.  LAB RESULTS:  Lab Results  Component Value Date   NA 141 06/18/2024   K 3.9 06/18/2024   CL 101 06/18/2024   CO2 21 (L) 06/18/2024   GLUCOSE 88 06/18/2024   BUN 26 (H) 06/18/2024   CREATININE 1.34 (H) 06/18/2024   CALCIUM 9.5 06/18/2024   PROT 6.8 06/16/2024   ALBUMIN  4.0 06/16/2024   AST 22 06/16/2024   ALT 14 06/16/2024   ALKPHOS 36 (L) 06/16/2024   BILITOT 1.1 06/16/2024   GFRNONAA 58 (L) 06/18/2024   GFRAA 86 08/26/2018    Lab Results  Component Value Date   WBC 5.6 08/03/2024   NEUTROABS 3.6 06/16/2024   HGB 14.3  08/03/2024   HCT 40.7 08/03/2024   MCV 89.6 08/03/2024   PLT 119 (L) 08/03/2024   Lab Results  Component Value Date   IRON 125 08/03/2024   TIBC 274 08/03/2024   IRONPCTSAT 46 (H) 08/03/2024   Lab Results  Component Value Date   FERRITIN 210 08/03/2024     STUDIES: US  Abdomen Complete Result Date: 08/18/2024 CLINICAL DATA:  Thrombocytopenia. EXAM: ABDOMEN ULTRASOUND COMPLETE COMPARISON:  None Available. FINDINGS: Gallbladder: Surgically removed. Common bile duct: Diameter: 6.2 mm. Liver: No focal lesion identified. Increased echotexture. Portal vein is patent on color Doppler imaging with normal direction of blood flow towards the liver. IVC: No abnormality visualized. Pancreas: Limited visualization per ultrasound technologist. Spleen: Size and appearance within normal limits. Right Kidney: Length: 13.9 cm. 5.4 mm nonobstructing stone is identified the right kidney. Echogenicity within normal limits. No mass or hydronephrosis visualized. Left Kidney: Length: 13 cm. Echogenicity within normal limits. No mass or hydronephrosis visualized. Abdominal aorta: No aneurysm visualized. Other  findings: None. IMPRESSION: 1. No acute abnormality identified. 2. Fatty infiltration of the liver. 3. 5.4 mm nonobstructing stone in the right kidney. Electronically Signed   By: Craig Farr M.D.   On: 08/18/2024 12:42    ASSESSMENT: Thrombocytopenia.  PLAN:    Thrombocytopenia: Patient's most recent platelet count is only mildly reduced at 119 today.  Upon review of his chart patient's platelet count has ranged between 80 and 152 September 2017.  All of his level laboratory work including platelet antibody profile and SPEP is either negative or within normal limits.  [employer] Myeloid panel is pending at time of dictation.  Abdominal ultrasound on August 18, 2024 did not reveal splenomegaly.  No intervention is needed.  Patient does not require bone marrow biopsy.  After discussion with the patient, was agreed upon that no further follow-up is necessary.  Please continue to monitor platelets 1 or 2 times per year and if they decrease and remain persistently below 80,000, please refer patient back for further evaluation.    I provided 20 minutes of face-to-face video visit time during this encounter which included chart review, counseling, and coordination of care as documented above.   Patient expressed understanding and was in agreement with this plan. He also understands that He can call clinic at any time with any questions, concerns, or complaints.     Evalene JINNY Reusing, MD   08/26/2024 9:45 AM

## 2024-08-28 ENCOUNTER — Encounter (HOSPITAL_COMMUNITY): Payer: Self-pay

## 2024-09-01 ENCOUNTER — Telehealth (HOSPITAL_COMMUNITY): Payer: Self-pay | Admitting: *Deleted

## 2024-09-01 NOTE — Telephone Encounter (Signed)
 Reaching out to patient to offer assistance regarding upcoming cardiac imaging study; pt verbalizes understanding of appt date/time, parking situation and where to check in, pre-test NPO status; name and call back number provided for further questions should they arise  Chantal Requena RN Navigator Cardiac Imaging [facility] [phone] office [phone] cell  Patient verbalized understanding of diet prep and about the situation with the authorization of the study.

## 2024-09-03 ENCOUNTER — Encounter (HOSPITAL_COMMUNITY)
Admission: RE | Admit: 2024-09-03 | Discharge: 2024-09-03 | Disposition: A | Discharge status: Home / Self Care | Source: Ambulatory Visit | Attending: Cardiology | Admitting: Cardiology

## 2024-09-03 DIAGNOSIS — R931 Abnormal findings on diagnostic imaging of heart and coronary circulation: Secondary | ICD-10-CM | POA: Insufficient documentation

## 2024-09-03 DIAGNOSIS — R918 Other nonspecific abnormal finding of lung field: Secondary | ICD-10-CM | POA: Insufficient documentation

## 2024-09-03 DIAGNOSIS — K760 Fatty (change of) liver, not elsewhere classified: Secondary | ICD-10-CM | POA: Diagnosis not present

## 2024-09-03 DIAGNOSIS — D8685 Sarcoid myocarditis: Secondary | ICD-10-CM | POA: Diagnosis not present

## 2024-09-03 DIAGNOSIS — I7789 Other specified disorders of arteries and arterioles: Secondary | ICD-10-CM | POA: Diagnosis not present

## 2024-09-03 DIAGNOSIS — I700 Atherosclerosis of aorta: Secondary | ICD-10-CM | POA: Insufficient documentation

## 2024-09-03 DIAGNOSIS — I2510 Atherosclerotic heart disease of native coronary artery without angina pectoris: Secondary | ICD-10-CM | POA: Diagnosis not present

## 2024-09-03 LAB — NM PET CT MYOCARDIAL SARCOIDOSIS
LV dias vol: 123 mL (ref 62–150)
Nuc Stress EF: 49 %
Rest Nuclear Isotope Dose: 30 mCi

## 2024-09-03 MED ORDER — RUBIDIUM RB82 GENERATOR (RUBYFILL)
29.9800 | PACK | Freq: Once | INTRAVENOUS | Status: AC
  Administered 2024-09-03: 29.98 via INTRAVENOUS

## 2024-09-03 MED ORDER — FLUDEOXYGLUCOSE F - 18 (FDG) INJECTION
9.0200 | Freq: Once | INTRAVENOUS | Status: AC
  Administered 2024-09-03: 9.02 via INTRAVENOUS

## 2024-09-07 LAB — INTELLIGEN MYELOID

## 2024-09-14 ENCOUNTER — Ambulatory Visit (HOSPITAL_COMMUNITY): Payer: Self-pay | Admitting: Cardiology

## 2024-09-15 ENCOUNTER — Telehealth (HOSPITAL_COMMUNITY): Payer: Self-pay | Admitting: Cardiology

## 2024-09-15 NOTE — Telephone Encounter (Signed)
 Called to confirm/remind patient of their appointment at the [facility] on 09/15/24.   Appointment:   [x] Confirmed  [] Left mess   [] No answer/No voice mail  [] VM Full/unable to leave message  [] Phone not in service  Patient reminded to bring all medications and/or complete list.  Confirmed patient has transportation. Gave directions, instructed to utilize valet parking.

## 2024-09-16 ENCOUNTER — Ambulatory Visit (HOSPITAL_COMMUNITY)
Admission: RE | Admit: 2024-09-16 | Discharge: 2024-09-16 | Disposition: A | Discharge status: Home / Self Care | Source: Ambulatory Visit | Attending: Cardiology | Admitting: Cardiology

## 2024-09-16 VITALS — BP 152/84 | HR 73 | Wt 305.2 lb

## 2024-09-16 DIAGNOSIS — E669 Obesity, unspecified: Secondary | ICD-10-CM | POA: Diagnosis not present

## 2024-09-16 DIAGNOSIS — I4821 Permanent atrial fibrillation: Secondary | ICD-10-CM

## 2024-09-16 DIAGNOSIS — I10 Essential (primary) hypertension: Secondary | ICD-10-CM

## 2024-09-16 DIAGNOSIS — Z95810 Presence of automatic (implantable) cardiac defibrillator: Secondary | ICD-10-CM | POA: Diagnosis not present

## 2024-09-16 DIAGNOSIS — I4891 Unspecified atrial fibrillation: Secondary | ICD-10-CM | POA: Diagnosis not present

## 2024-09-16 DIAGNOSIS — I5032 Chronic diastolic (congestive) heart failure: Secondary | ICD-10-CM | POA: Insufficient documentation

## 2024-09-16 DIAGNOSIS — Z7901 Long term (current) use of anticoagulants: Secondary | ICD-10-CM | POA: Diagnosis not present

## 2024-09-16 DIAGNOSIS — E119 Type 2 diabetes mellitus without complications: Secondary | ICD-10-CM | POA: Diagnosis not present

## 2024-09-16 DIAGNOSIS — Z79899 Other long term (current) drug therapy: Secondary | ICD-10-CM | POA: Insufficient documentation

## 2024-09-16 NOTE — Patient Instructions (Addendum)
 CONGRATULATIONS YOU HAVE GRADUATED FROM THE [facility].  No changes to your medications.  Follow up with your general cardiologist as scheduled.   If you have any questions or concerns before your next appointment please send us  a message through [place] or call our office at [phone].    TO LEAVE A MESSAGE FOR THE NURSE SELECT OPTION 2, PLEASE LEAVE A MESSAGE INCLUDING: YOUR NAME DATE OF BIRTH CALL BACK NUMBER REASON FOR CALL**this is important as we prioritize the call backs  YOU WILL RECEIVE A CALL BACK THE SAME DAY AS LONG AS YOU CALL BEFORE 4:00 PM  At the [facility], you and your health needs are our priority. As part of our continuing mission to provide you with exceptional heart care, we have created designated Provider Care Teams. These Care Teams include your primary Cardiologist (physician) and Advanced Practice Providers (APPs- Physician Assistants and Nurse Practitioners) who all work together to provide you with the care you need, when you need it.   You may see any of the following providers on your designated Care Team at your next follow up: Dr Toribio Fuel Dr Ezra Shuck Dr. Ria Commander Dr. Morene Brownie Amy Lenetta, NP Caffie Shed, [place] [facility] Waller, [place] Beckey Coe, NP [place] Lee, NP Ellouise Class, NP Tinnie Redman, PharmD Jaun Bash, PharmD   Please be sure to bring in all your medications bottles to every appointment.    Thank you for choosing [facility]

## 2024-09-20 NOTE — Progress Notes (Unsigned)
 Electrophysiology Clinic Note    Date:  09/21/2024  Patient ID:  Marco, James 25-Nov-1956, MRN [id number] PCP:  Barbra Odor, NP  Cardiologist:  Timothy Gollan, MD  Electrophysiologist:  Eulas FORBES Furbish, MD  Electrophysiology APP:  Isidro Monks, NP    Discussed the use of AI scribe software for clinical note transcription with the patient, who gave verbal consent to proceed.   Patient Profile    Chief Complaint: 90d ICD implant  History of Present Illness: Marco James is a 68 y.o. male with PMH notable for perm afib w slow ventricular response, cardiac sarcoid s/p ICD, HTN, HLD, OSA on CPAP, T2DM; seen today for Eulas FORBES Furbish, MD for routine electrophysiology follow-up s/p Defibrillator implant.  He was admitted 05/2024 with increased SOB, edema found to be in AFib w slow ventricular response, cMRI concerning for cardiac sarcoid. Pet CT also positive for diffuse LGE. Planned for CRT-D, but unable to implant CS lead.   On follow-up today, he is doing very well and is without cardiac complaint. He has returned to his usual activity, rides motorcycles and enjoys [employer].   He denies chest pain, chest pressure, palpitations. He recently was discharged from HF clinic d/t lack of cardiac sarcoid on PET imaging.         Arrhythmia/Device History Abbot single chamber ICD, imp 05/2024; dx cardiac sarcoid, CHB GRADUATED AHF 09/16/2024    ROS:  Please see the history of present illness. All other systems are reviewed and otherwise negative.    Physical Exam    VS:  BP 128/82 (BP Location: Left Arm, Patient Position: Sitting, Cuff Size: Normal)   Pulse 75   Ht 6' (1.829 m)   Wt (!) 303 lb (137.4 kg)   SpO2 98%   BMI 41.09 kg/m  BMI: Body mass index is 41.09 kg/m.           Wt Readings from Last 3 Encounters:  09/21/24 (!) 303 lb (137.4 kg)  09/16/24 (!) 305 lb 3.2 oz (138.4 kg)  08/03/24 (!) 315 lb (142.9 kg)     GEN- The patient is well  appearing, alert and oriented x 3 today.   Lungs- Clear to ausculation bilaterally, normal work of breathing.  Heart- Irregularly irregular rate and rhythm, no murmurs, rubs or gallops Extremities- No peripheral edema, warm, dry Skin-  device pocket well-healed, no tethering  Device interrogation done today and reviewed by myself:  Battery 6-7 years Lead thresholds, impedence, sensing stable  VP 66% No episodes No changes made today   Studies Reviewed   Previous EP, cardiology notes.    EKG is ordered. Personal review of EKG from today shows:    EKG Interpretation Date/Time:  Monday September 21 2024 13:05:21 EDT Ventricular Rate:  75 PR Interval:    QRS Duration:  128 QT Interval:  408 QTC Calculation: 455 R Axis:   -62  Text Interpretation: Atrial fibrillation with premature ventricular or aberrantly conducted complexes Left axis deviation Right bundle branch block Confirmed by Delaney Schnick [phone]) on 09/21/2024 1:08:34 PM    Myocardial Pet CT, 09/03/2024   FDG uptake was not observed. LV perfusion is normal. There is no evidence of infarction.   Left ventricular function is abnormal. Global function is mildly reduced. EF: 49%. End diastolic cavity size is normal.   Coronary calcium was present on the attenuation correction CT images. Severe coronary calcifications were present. Coronary calcifications were present in the left anterior descending artery,  left circumflex artery and right coronary artery distribution(s).   FDG uptake findings are inconsistent with active myocardial inflammation/sarcoidosis.  Myocardial Pet CT, 06/18/2024   Diffuse FDG uptake suggestive of poor prep but there is a clear area of high uptake in basal anteroseptum. Perfusion images are not interpretable on this study, but area of FDG uptake corresponds to location of LGE on cardiac MRI. Findings are suggestive of cardiac sarcoidosis   FDG uptake was observed. FDG uptake was focal. FDG uptake was  present in the basal anteroseptal segment(s). LV perfusion is equivocal.   Left ventricular function is abnormal. Global function is mildly reduced. EF: 45%. End diastolic cavity size is normal. End systolic cavity size is normal.   Coronary calcium was present on the attenuation correction CT images. Severe coronary calcifications were present. Coronary calcifications were present in the left anterior descending artery, left circumflex artery and right coronary artery distribution(s).   FDG uptake findings are consistent with active myocardial inflammation/sarcoidosis.  Cardiac MRI, 06/17/2024 1.  Suboptimal quality study due to arrhythmia.  2. Grossly normal LV function, visually appears 50-55%. There is post contrast delayed myocardial enhancement. While LGE is not optimally visualized, there appears to be papillary muscle LGE at the base, and subendocardial with patchy midmyocardial LGE in the lateral wall base and mid. There is also a midwall septal scar at the LV base and mid. There is RV insertion point LGE anterior and inferior, which may indicate elevated pulmonary pressures. These finding in combination are nonspecific. With multiple areas of LGE, consider cardiac sarcoidosis. Cannot exclude lateral wall infarct with presence of subendocardial lateral wall LGE.  3. RV poorly assessed. Likely borderline RV dilation by estimated volumes.  4.  Massive LA dilation.  5.  Small pericardial effusion without pericardial enhancement.  TTE, 06/16/2024  1. Left ventricular ejection fraction, by estimation, is 55 to 60%. The left ventricle has normal function. Left ventricular endocardial border not optimally defined to evaluate regional wall motion. There is mild concentric left ventricular hypertrophy. Left ventricular diastolic parameters are indeterminate.   2. Right ventricular systolic function is normal. The right ventricular size is severely enlarged.   3. The mitral valve was not well visualized.  No evidence of mitral valve regurgitation.   4. The aortic valve was not well visualized. Aortic valve regurgitation is not visualized. No aortic stenosis is present.   5. The inferior vena cava is normal in size with greater than 50% respiratory variability, suggesting right atrial pressure of 3 mmHg.   6. This is a technically difficult study despite the use of contrast.   Comparison(s): No prior Echocardiogram.    Assessment and Plan     #) CHB #) perm afib #) concern for cardiac sarcoid s/p ICD ICD functioning well, see paceart for details VP 66% No arrhythmias  #) Hypercoag d/t perm afib CHA2DS2-VASc Score = at least 3 [CHF History: 0, HTN History: 1, Diabetes History: 1, Stroke History: 0, Vascular Disease History: 0, Age Score: 1, Gender Score: 0].  Therefore, the patient's annual risk of stroke is 3.2 %.    Stroke ppx - 20mg  xarelto  daily, appropriately dosed No bleeding concerns        Current medicines are reviewed at length with the patient today.   The patient does not have concerns regarding his medicines.  The following changes were made today:  none  Labs/ tests ordered today include:  Orders Placed This Encounter  Procedures   EKG 12-Lead  Disposition: Follow up with Dr. Nancey or EP APP in 12 months   Signed, Hamdi Vari, NP  09/21/24  1:40 PM  Electrophysiology [facility]

## 2024-09-21 ENCOUNTER — Encounter: Payer: Self-pay | Admitting: Cardiology

## 2024-09-21 ENCOUNTER — Ambulatory Visit: Attending: Cardiology | Admitting: Cardiology

## 2024-09-21 VITALS — BP 128/82 | HR 75 | Ht 72.0 in | Wt 303.0 lb

## 2024-09-21 DIAGNOSIS — I4821 Permanent atrial fibrillation: Secondary | ICD-10-CM

## 2024-09-21 DIAGNOSIS — D8685 Sarcoid myocarditis: Secondary | ICD-10-CM | POA: Diagnosis not present

## 2024-09-21 DIAGNOSIS — Z95810 Presence of automatic (implantable) cardiac defibrillator: Secondary | ICD-10-CM | POA: Diagnosis not present

## 2024-09-21 DIAGNOSIS — I442 Atrioventricular block, complete: Secondary | ICD-10-CM | POA: Diagnosis not present

## 2024-09-21 LAB — CUP PACEART INCLINIC DEVICE CHECK
Battery Remaining Longevity: 84 mo
Brady Statistic RV Percent Paced: 66 %
Date Time Interrogation Session: 20250929134310
HighPow Impedance: 70.875
Implantable Lead Connection Status: 753985
Implantable Lead Implant Date: 20250626
Implantable Lead Location: 753860
Implantable Pulse Generator Implant Date: 20250626
Lead Channel Impedance Value: 537.5 Ohm
Lead Channel Pacing Threshold Amplitude: 0.5 V
Lead Channel Pacing Threshold Amplitude: 2.25 V
Lead Channel Pacing Threshold Pulse Width: 0.5 ms
Lead Channel Pacing Threshold Pulse Width: 0.5 ms
Lead Channel Sensing Intrinsic Amplitude: 12 mV
Lead Channel Setting Pacing Amplitude: 0.75 V
Lead Channel Setting Pacing Pulse Width: 0.5 ms
Lead Channel Setting Sensing Sensitivity: 0.5 mV
Pulse Gen Serial Number: 810102862
Zone Setting Status: 755011

## 2024-09-23 ENCOUNTER — Ambulatory Visit

## 2024-09-23 DIAGNOSIS — I4821 Permanent atrial fibrillation: Secondary | ICD-10-CM | POA: Diagnosis not present

## 2024-09-23 LAB — CUP PACEART REMOTE DEVICE CHECK
Battery Remaining Longevity: 112 mo
Battery Remaining Percentage: 92 %
Battery Voltage: 3.01 V
Brady Statistic RV Percent Paced: 62 %
Date Time Interrogation Session: 20251001020513
HighPow Impedance: 73 Ohm
Implantable Lead Connection Status: 753985
Implantable Lead Implant Date: 20250626
Implantable Lead Location: 753860
Implantable Pulse Generator Implant Date: 20250626
Lead Channel Impedance Value: 530 Ohm
Lead Channel Pacing Threshold Amplitude: 0.5 V
Lead Channel Pacing Threshold Pulse Width: 0.5 ms
Lead Channel Sensing Intrinsic Amplitude: 12 mV
Lead Channel Setting Pacing Amplitude: 0.75 V
Lead Channel Setting Pacing Pulse Width: 0.5 ms
Lead Channel Setting Sensing Sensitivity: 0.5 mV
Pulse Gen Serial Number: 810102862
Zone Setting Status: 755011

## 2024-09-26 NOTE — Progress Notes (Signed)
   ADVANCED HEART FAILURE FOLLOW-UP PATIENT CLINIC NOTE  Referring Physician: Barbra Odor, NP  Primary Care: Barbra Odor, NP Primary Cardiologist:  HPI: Marco James is a 68 y.o. male with a PMH of CHB, HFpEF, obesitgy, diabetes, atrial fibrilation who presents for follow-up visit for concern for cardiac sarcoid.     Patient was recently admitted in 05/2024 with symptoms of fatigue, volume overload, and shortness of breath. He had been followed by [facility] and has been on escalating doses of diuretics with minimal improvement in his symptoms. Had an episode of bradycardia in 2019 that was chalked up to beta blockers. History of atrial fibrillation as well. Underwent inpatient workup for cardiac sarcoid, positive PET scan as well as MRI showing diffuse LGE. Underwent attempted CRT, but large coronary sinus so single chamber ICD placed.      SUBJECTIVE: Since his last visit, initial PET scan was reviewed multidisciplinary conference and thought to be the result of poor preparation.  PET scan was repeated and there was no significant uptake or inflammation.  Patient has overall done well since his last visit.  We discussed that given his lack of extracardiac disease and negative PET scan it is highly unlikely that he has active cardiac sarcoid.  PMH, current medications, allergies, social history, and family history reviewed in epic.  PHYSICAL EXAM: Vitals:   09/16/24 1400  BP: (!) 152/84  Pulse: 73  SpO2: 96%   GENERAL: NAD, well appearing PULM:  Normal work of breathing, CTAB CARDIAC:  JVP: flat         Normal rate with regular rhythm. No murmurs, rubs or gallops.  Trace edema. Warm and well perfused extremities. ABDOMEN: Soft, non-tender, non-distended. NEUROLOGIC: Patient is oriented x3 with no focal or lateralizing neurologic deficits.      DATA REVIEW  ECG: 06/23/2024: RV paced rhythm  ECHO: 06/16/24: LVEF 55-60%, mild LVH, normal RV  CATH: None   CMR: LVEF  50-55%, papillary muscle LGE, patchy midmyocardial LGE at the lateral wall base and mid, RV insertion point LGE, massive LA dilation  PET: Potential poor prep, but clear area of high uptake in the basal anteroseptum, correlates to MRI LGE, suggestive of cardiac sarcoid Repeated PET scan with no evidence of cardiac sarcoid      ASSESSMENT & PLAN:  Chronic diastolic heart failure: After multidisciplinary review, repeated PET scan with no significant evidence of FDG uptake.  No evidence of extracardiac sarcoid either.  Given these findings, doubt that his symptoms are sarcoid related. - Findings extensively discussed with patient, will continue follow-up with EP and general cardiology  HFpEF: Suspect primarily related to hypertension, CAD not ruled out.  Does not have active anginal symptoms.  - Continue amlodipine  5 mg daily - Continue lisinopril  40 mg daily - Continue torsemide  40 mg as needed - Continue Cardura  60 mg daily  Atrial fibrillation: Suspected permanent, now paced.  - Continue rivaroxaban  20mg  daily - No clear attempts at rhythm control - needs sleep study   Follow-up as needed  Morene Brownie, MD Advanced Heart Failure Mechanical Circulatory Support 09/26/24

## 2024-09-28 NOTE — Progress Notes (Signed)
 Remote ICD Transmission

## 2024-09-29 ENCOUNTER — Ambulatory Visit: Payer: Self-pay | Admitting: Cardiovascular Disease

## 2024-10-05 ENCOUNTER — Ambulatory Visit: Payer: Self-pay | Admitting: Cardiovascular Disease

## 2024-11-02 ENCOUNTER — Encounter

## 2024-12-23 ENCOUNTER — Ambulatory Visit

## 2024-12-23 DIAGNOSIS — I4821 Permanent atrial fibrillation: Secondary | ICD-10-CM | POA: Diagnosis not present

## 2024-12-24 LAB — CUP PACEART REMOTE DEVICE CHECK
Battery Remaining Longevity: 109 mo
Battery Remaining Percentage: 90 %
Battery Voltage: 3.01 V
Brady Statistic RV Percent Paced: 58 %
Date Time Interrogation Session: 20251231020434
HighPow Impedance: 68 Ohm
Implantable Lead Connection Status: 753985
Implantable Lead Implant Date: 20250626
Implantable Lead Location: 753860
Implantable Pulse Generator Implant Date: 20250626
Lead Channel Impedance Value: 540 Ohm
Lead Channel Pacing Threshold Amplitude: 0.625 V
Lead Channel Pacing Threshold Pulse Width: 0.5 ms
Lead Channel Sensing Intrinsic Amplitude: 12 mV
Lead Channel Setting Pacing Amplitude: 0.875
Lead Channel Setting Pacing Pulse Width: 0.5 ms
Lead Channel Setting Sensing Sensitivity: 0.5 mV
Pulse Gen Serial Number: 810102862
Zone Setting Status: 755011

## 2024-12-28 NOTE — Progress Notes (Signed)
 Remote ICD Transmission

## 2025-01-02 ENCOUNTER — Ambulatory Visit: Payer: Self-pay | Admitting: Cardiovascular Disease

## 2025-01-07 ENCOUNTER — Ambulatory Visit: Payer: Self-pay | Admitting: Pulmonary Disease

## 2025-02-01 ENCOUNTER — Encounter

## 2025-03-24 ENCOUNTER — Encounter

## 2025-05-03 ENCOUNTER — Encounter

## 2025-06-23 ENCOUNTER — Encounter

## 2025-08-02 ENCOUNTER — Encounter
